# Patient Record
Sex: Male | Born: 1960 | Race: White | Hispanic: No | Marital: Married | Smoking: Former smoker
Health system: Southern US, Community
[De-identification: ages and names within clinical notes are randomized; demographics above are authoritative.]

## PROBLEM LIST (undated history)

## (undated) DIAGNOSIS — E785 Hyperlipidemia, unspecified: Secondary | ICD-10-CM

## (undated) DIAGNOSIS — T4145XA Adverse effect of unspecified anesthetic, initial encounter: Secondary | ICD-10-CM

## (undated) DIAGNOSIS — E119 Type 2 diabetes mellitus without complications: Secondary | ICD-10-CM

## (undated) DIAGNOSIS — Z87442 Personal history of urinary calculi: Secondary | ICD-10-CM

## (undated) DIAGNOSIS — I10 Essential (primary) hypertension: Secondary | ICD-10-CM

## (undated) DIAGNOSIS — Z9989 Dependence on other enabling machines and devices: Secondary | ICD-10-CM

## (undated) DIAGNOSIS — T8859XA Other complications of anesthesia, initial encounter: Secondary | ICD-10-CM

## (undated) DIAGNOSIS — G4733 Obstructive sleep apnea (adult) (pediatric): Secondary | ICD-10-CM

## (undated) DIAGNOSIS — I4819 Other persistent atrial fibrillation: Secondary | ICD-10-CM

## (undated) DIAGNOSIS — I2510 Atherosclerotic heart disease of native coronary artery without angina pectoris: Secondary | ICD-10-CM

## (undated) HISTORY — DX: Obstructive sleep apnea (adult) (pediatric): G47.33

## (undated) HISTORY — PX: KIDNEY STONE SURGERY: SHX686

## (undated) HISTORY — DX: Hyperlipidemia, unspecified: E78.5

## (undated) HISTORY — DX: Other persistent atrial fibrillation: I48.19

## (undated) HISTORY — PX: KNEE SURGERY: SHX244

## (undated) HISTORY — DX: Essential (primary) hypertension: I10

## (undated) HISTORY — DX: Obstructive sleep apnea (adult) (pediatric): Z99.89

---

## 1999-10-17 ENCOUNTER — Encounter: Payer: Self-pay | Admitting: Orthopedic Surgery

## 1999-10-17 ENCOUNTER — Ambulatory Visit (HOSPITAL_COMMUNITY): Admission: RE | Admit: 1999-10-17 | Discharge: 1999-10-17 | Payer: Self-pay | Admitting: Orthopedic Surgery

## 2001-09-18 ENCOUNTER — Emergency Department (HOSPITAL_COMMUNITY): Admission: EM | Admit: 2001-09-18 | Discharge: 2001-09-18 | Payer: Self-pay | Admitting: Emergency Medicine

## 2001-09-18 ENCOUNTER — Encounter: Payer: Self-pay | Admitting: Emergency Medicine

## 2013-08-04 ENCOUNTER — Ambulatory Visit (INDEPENDENT_AMBULATORY_CARE_PROVIDER_SITE_OTHER): Payer: BC Managed Care – PPO

## 2013-08-04 ENCOUNTER — Ambulatory Visit (INDEPENDENT_AMBULATORY_CARE_PROVIDER_SITE_OTHER): Payer: BC Managed Care – PPO | Admitting: General Practice

## 2013-08-04 ENCOUNTER — Encounter (INDEPENDENT_AMBULATORY_CARE_PROVIDER_SITE_OTHER): Payer: Self-pay

## 2013-08-04 ENCOUNTER — Telehealth: Payer: Self-pay | Admitting: Family Medicine

## 2013-08-04 ENCOUNTER — Encounter: Payer: Self-pay | Admitting: General Practice

## 2013-08-04 VITALS — BP 135/78 | HR 76 | Temp 98.0°F | Ht 73.0 in | Wt 304.0 lb

## 2013-08-04 DIAGNOSIS — S99919A Unspecified injury of unspecified ankle, initial encounter: Secondary | ICD-10-CM

## 2013-08-04 DIAGNOSIS — M25569 Pain in unspecified knee: Secondary | ICD-10-CM

## 2013-08-04 DIAGNOSIS — S8990XA Unspecified injury of unspecified lower leg, initial encounter: Secondary | ICD-10-CM

## 2013-08-04 DIAGNOSIS — M25561 Pain in right knee: Secondary | ICD-10-CM

## 2013-08-04 MED ORDER — HYDROCODONE-ACETAMINOPHEN 5-300 MG PO TABS: 1.0000 | ORAL_TABLET | Freq: Three times a day (TID) | ORAL | Status: DC | PRN

## 2013-08-04 NOTE — Telephone Encounter (Signed)
Appt given for today 

## 2013-08-05 NOTE — Progress Notes (Signed)
  Subjective:    Patient ID: Christopher Phillips, male    DOB: Mar 03, 1961, 52 y.o.   MRN: [id number]  Knee Pain  The incident occurred 3 to 5 days ago. The incident occurred at home. The injury mechanism is unknown. The pain is present in the right knee. The quality of the pain is described as aching. The pain is at a severity of 7/10. The pain is moderate. The pain has been constant since onset. Pertinent negatives include no loss of motion, loss of sensation, muscle weakness, numbness or tingling. Associated symptoms comments: Reports difficult to bear weight . He reports no foreign bodies present. The symptoms are aggravated by movement and weight bearing. He has tried ice for the symptoms. The treatment provided no relief.      Review of Systems  Constitutional: Negative for fever and chills.  Respiratory: Negative for shortness of breath.   Cardiovascular: Negative for chest pain.  Musculoskeletal: Negative for joint swelling.       Right knee pain  Neurological: Negative for dizziness, tingling, weakness, numbness and headaches.       Objective:   Physical Exam  Constitutional: He is oriented to person, place, and time. He appears well-developed and well-nourished.  Cardiovascular:  Pulses:      Dorsalis pedis pulses are 1+ on the right side.  Pulmonary/Chest: Effort normal and breath sounds normal. No respiratory distress. He exhibits no tenderness.  Musculoskeletal: He exhibits tenderness.  Right knee pain with movement or palpation   Neurological: He is alert and oriented to person, place, and time.  Skin: Skin is warm and dry.  Psychiatric: He has a normal mood and affect.         [facility] reading (PRIMARY) by Coralie Keens, FNP-C, narrowing joint space right knee, noted.                                   Assessment & Plan:  1. Knee pain, right  - DG Knee 1-2 Views Right; Future - Ambulatory referral to Orthopedic Surgery - Hydrocodone-Acetaminophen 5-300 MG TABS; Take  1 tablet by mouth every 8 (eight) hours as needed.  Dispense: 20 each; Refill: 0  2. Right knee injury, initial encounter -RICE instructions provided and discussed -may seek emergency medical treatment if symptoms worsen prior to ortho appointment -Patient verbalized understanding Coralie Keens, FNP-C

## 2013-08-10 ENCOUNTER — Other Ambulatory Visit: Payer: Self-pay | Admitting: General Practice

## 2013-10-09 ENCOUNTER — Encounter: Payer: Self-pay | Admitting: Physician Assistant

## 2013-10-09 ENCOUNTER — Encounter (INDEPENDENT_AMBULATORY_CARE_PROVIDER_SITE_OTHER): Payer: Self-pay

## 2013-10-09 ENCOUNTER — Ambulatory Visit (INDEPENDENT_AMBULATORY_CARE_PROVIDER_SITE_OTHER): Payer: BC Managed Care – PPO | Admitting: Physician Assistant

## 2013-10-09 VITALS — BP 152/97 | Temp 98.1°F | Resp 74 | Ht 73.0 in | Wt 310.8 lb

## 2013-10-09 DIAGNOSIS — L918 Other hypertrophic disorders of the skin: Secondary | ICD-10-CM

## 2013-10-09 DIAGNOSIS — L909 Atrophic disorder of skin, unspecified: Secondary | ICD-10-CM

## 2013-10-09 NOTE — Progress Notes (Signed)
   Subjective:    Patient ID: Jamarius Saha, male    DOB: 1961/03/22, 52 y.o.   MRN: [id number]  HPI 52 y/o male presents for skin tags that are irritated by clothing.    Review of Systems  Constitutional: Negative.   HENT: Negative.   Respiratory: Negative.   Skin: Negative.   Neurological: Negative.   Psychiatric/Behavioral: Negative.        Objective:   Physical Exam  Nursing note and vitals reviewed. Constitutional: He appears well-developed and well-nourished. No distress.  Pulmonary/Chest: Effort normal and breath sounds normal.  Skin: He is not diaphoretic.  Skin tags noted in bilateral axilla, neckline and R upper eyelid          Assessment & Plan:  1. Benign skin tags: Approximately 12-15 benign appearing skin tags Removed via cryosurgery and excision w/ injection of 2% lidocaine on bilateral axilla, r upper eyelid and neckline. Monsels and compression w/ gauze applied. Instructed patient to use vaseline for healing. No further treatment required.

## 2013-10-09 NOTE — Patient Instructions (Signed)
Apply vaseline for healing. Compression w/ gauze may be needed if bleeding occurs.

## 2013-11-26 ENCOUNTER — Ambulatory Visit: Payer: BC Managed Care – PPO | Attending: Orthopedic Surgery | Admitting: Physical Therapy

## 2013-11-26 DIAGNOSIS — IMO0001 Reserved for inherently not codable concepts without codable children: Secondary | ICD-10-CM | POA: Insufficient documentation

## 2013-11-26 DIAGNOSIS — M1710 Unilateral primary osteoarthritis, unspecified knee: Secondary | ICD-10-CM | POA: Insufficient documentation

## 2013-11-26 DIAGNOSIS — M25669 Stiffness of unspecified knee, not elsewhere classified: Secondary | ICD-10-CM | POA: Insufficient documentation

## 2013-11-26 DIAGNOSIS — R5381 Other malaise: Secondary | ICD-10-CM | POA: Insufficient documentation

## 2013-11-26 DIAGNOSIS — M25569 Pain in unspecified knee: Secondary | ICD-10-CM | POA: Insufficient documentation

## 2013-12-01 ENCOUNTER — Encounter: Payer: BC Managed Care – PPO | Admitting: Physical Therapy

## 2013-12-04 ENCOUNTER — Ambulatory Visit: Payer: BC Managed Care – PPO | Admitting: Physical Therapy

## 2013-12-07 ENCOUNTER — Ambulatory Visit: Payer: BC Managed Care – PPO | Admitting: Physical Therapy

## 2013-12-11 ENCOUNTER — Ambulatory Visit: Payer: BC Managed Care – PPO | Admitting: Physical Therapy

## 2013-12-14 ENCOUNTER — Ambulatory Visit: Payer: BC Managed Care – PPO | Admitting: Family Medicine

## 2013-12-22 ENCOUNTER — Ambulatory Visit: Payer: BC Managed Care – PPO | Attending: Orthopedic Surgery | Admitting: Physical Therapy

## 2013-12-22 DIAGNOSIS — R5381 Other malaise: Secondary | ICD-10-CM | POA: Insufficient documentation

## 2013-12-22 DIAGNOSIS — M25669 Stiffness of unspecified knee, not elsewhere classified: Secondary | ICD-10-CM | POA: Insufficient documentation

## 2013-12-22 DIAGNOSIS — M25569 Pain in unspecified knee: Secondary | ICD-10-CM | POA: Insufficient documentation

## 2013-12-22 DIAGNOSIS — M1710 Unilateral primary osteoarthritis, unspecified knee: Secondary | ICD-10-CM | POA: Insufficient documentation

## 2014-01-06 ENCOUNTER — Other Ambulatory Visit (HOSPITAL_COMMUNITY): Payer: Self-pay | Admitting: Urology

## 2014-01-06 ENCOUNTER — Other Ambulatory Visit: Payer: Self-pay | Admitting: Urology

## 2014-01-06 DIAGNOSIS — N200 Calculus of kidney: Secondary | ICD-10-CM

## 2014-01-22 ENCOUNTER — Encounter (HOSPITAL_COMMUNITY): Payer: Self-pay | Admitting: Pharmacy Technician

## 2014-01-28 NOTE — Patient Instructions (Addendum)
Christopher ChardBarry Phillips  01/28/2014                           YOUR PROCEDURE IS SCHEDULED ON: 02/08/14               PLEASE REPORT RADIOLOGY AT : 7:30 AM               CALL THIS NUMBER IF ANY PROBLEMS THE DAY OF SURGERY :               [phone]                                REMEMBER:   Do not eat food or drink liquids AFTER MIDNIGHT                  Take these medicines the morning of surgery with A SIP OF WATER: none   Do not wear jewelry, make-up   Do not wear lotions, powders, or perfumes.   Do not shave legs or underarms 12 hrs. before surgery (men may shave face)  Do not bring valuables to the hospital.  Contacts, dentures or bridgework may not be worn into surgery.  Leave suitcase in the car. After surgery it may be brought to your room.  For patients admitted to the hospital more than one night, checkout time is            11:00 AM                                                       The day of discharge.   Patients discharged the day of surgery will not be allowed to drive home.            If going home same day of surgery, must have someone stay with you              FIRST 24 hrs at home and arrange for some one to drive you              home from hospital.    Special Instructions             Please read over the following fact sheets that you were given:               1. [place] PREPARING FOR SURGERY SHEET                2. DRINK ONE 8-10 OZ BOTTLE OF MAG CITRATE BY NOON THE DAY BEFORE PROCEDURE                                                    X_____________________________________________________________________        Failure to follow these instructions may result in cancellation of your surgery

## 2014-01-29 ENCOUNTER — Encounter (HOSPITAL_COMMUNITY): Payer: Self-pay

## 2014-01-29 ENCOUNTER — Encounter (HOSPITAL_COMMUNITY)
Admission: RE | Admit: 2014-01-29 | Discharge: 2014-01-29 | Disposition: A | Discharge status: Home / Self Care | Payer: BC Managed Care – PPO | Source: Ambulatory Visit | Attending: Urology | Admitting: Urology

## 2014-01-29 ENCOUNTER — Encounter (INDEPENDENT_AMBULATORY_CARE_PROVIDER_SITE_OTHER): Payer: Self-pay

## 2014-01-29 DIAGNOSIS — Z01810 Encounter for preprocedural cardiovascular examination: Secondary | ICD-10-CM | POA: Insufficient documentation

## 2014-01-29 DIAGNOSIS — Z01812 Encounter for preprocedural laboratory examination: Secondary | ICD-10-CM | POA: Insufficient documentation

## 2014-01-29 HISTORY — DX: Other complications of anesthesia, initial encounter: T88.59XA

## 2014-01-29 HISTORY — DX: Personal history of urinary calculi: Z87.442

## 2014-01-29 HISTORY — DX: Adverse effect of unspecified anesthetic, initial encounter: T41.45XA

## 2014-01-29 LAB — CBC
HEMATOCRIT: 45.1 % (ref 39.0–52.0)
Hemoglobin: 15.7 g/dL (ref 13.0–17.0)
MCH: 31 pg (ref 26.0–34.0)
MCHC: 34.8 g/dL (ref 30.0–36.0)
MCV: 89.1 fL (ref 78.0–100.0)
PLATELETS: 170 10*3/uL (ref 150–400)
RBC: 5.06 MIL/uL (ref 4.22–5.81)
RDW: 13.1 % (ref 11.5–15.5)
WBC: 6 10*3/uL (ref 4.0–10.5)

## 2014-01-29 LAB — BASIC METABOLIC PANEL
BUN: 12 mg/dL (ref 6–23)
CO2: 27 meq/L (ref 19–32)
CREATININE: 0.85 mg/dL (ref 0.50–1.35)
Calcium: 9.6 mg/dL (ref 8.4–10.5)
Chloride: 102 mEq/L (ref 96–112)
GFR calc Af Amer: >90 mL/min (ref >90)
GFR calc non Af Amer: >90 mL/min (ref >90)
Glucose, Bld: 114 mg/dL — ABNORMAL HIGH (ref 70–99)
Potassium: 4.1 mEq/L (ref 3.7–5.3)
Sodium: 140 mEq/L (ref 137–147)

## 2014-01-29 NOTE — Progress Notes (Signed)
01/29/14 1101  OBSTRUCTIVE SLEEP APNEA  Have you ever been diagnosed with sleep apnea through a sleep study? No  Do you snore loudly (loud enough to be heard through closed doors)?  1  Do you often feel tired, fatigued, or sleepy during the daytime? 0  Has anyone observed you stop breathing during your sleep? 1  Do you have, or are you being treated for high blood pressure? 1  BMI more than 35 kg/m2? 1  Age over 55 years old? 1  Neck circumference greater than 40 cm/18 inches? 1  Gender: 1  Obstructive Sleep Apnea Score 7  Score 4 or greater  Results sent to PCP

## 2014-02-04 ENCOUNTER — Other Ambulatory Visit: Payer: Self-pay | Admitting: Radiology

## 2014-02-07 MED ORDER — GENTAMICIN SULFATE 40 MG/ML IJ SOLN
400.0000 mg | INTRAVENOUS | Status: AC
  Administered 2014-02-08: 400 mg via INTRAVENOUS
  Filled 2014-02-07 (×2): qty 10

## 2014-02-08 ENCOUNTER — Encounter (HOSPITAL_COMMUNITY): Payer: BC Managed Care – PPO | Admitting: Anesthesiology

## 2014-02-08 ENCOUNTER — Ambulatory Visit (HOSPITAL_COMMUNITY)
Admission: RE | Admit: 2014-02-08 | Discharge: 2014-02-08 | Disposition: A | Discharge status: Home / Self Care | Payer: BC Managed Care – PPO | Source: Ambulatory Visit | Attending: Urology | Admitting: Urology

## 2014-02-08 ENCOUNTER — Encounter (HOSPITAL_COMMUNITY)
Admission: RE | Disposition: A | Discharge status: Home / Self Care | Payer: Self-pay | Source: Ambulatory Visit | Attending: Urology

## 2014-02-08 ENCOUNTER — Inpatient Hospital Stay (HOSPITAL_COMMUNITY)
Admission: RE | Admit: 2014-02-08 | Discharge: 2014-02-12 | DRG: 661 | Disposition: A | Discharge status: Home / Self Care | Payer: BC Managed Care – PPO | Source: Ambulatory Visit | Attending: Urology | Admitting: Urology

## 2014-02-08 ENCOUNTER — Inpatient Hospital Stay (HOSPITAL_COMMUNITY): Payer: BC Managed Care – PPO | Admitting: Anesthesiology

## 2014-02-08 ENCOUNTER — Inpatient Hospital Stay (HOSPITAL_COMMUNITY): Payer: BC Managed Care – PPO

## 2014-02-08 ENCOUNTER — Encounter (HOSPITAL_COMMUNITY): Payer: Self-pay

## 2014-02-08 VITALS — BP 146/88 | HR 79 | Resp 21

## 2014-02-08 DIAGNOSIS — I4891 Unspecified atrial fibrillation: Secondary | ICD-10-CM

## 2014-02-08 DIAGNOSIS — Z87442 Personal history of urinary calculi: Secondary | ICD-10-CM

## 2014-02-08 DIAGNOSIS — Z87891 Personal history of nicotine dependence: Secondary | ICD-10-CM

## 2014-02-08 DIAGNOSIS — I10 Essential (primary) hypertension: Secondary | ICD-10-CM | POA: Diagnosis present

## 2014-02-08 DIAGNOSIS — N200 Calculus of kidney: Principal | ICD-10-CM | POA: Diagnosis present

## 2014-02-08 DIAGNOSIS — E785 Hyperlipidemia, unspecified: Secondary | ICD-10-CM | POA: Diagnosis present

## 2014-02-08 DIAGNOSIS — Z6837 Body mass index (BMI) 37.0-37.9, adult: Secondary | ICD-10-CM

## 2014-02-08 DIAGNOSIS — N3289 Other specified disorders of bladder: Secondary | ICD-10-CM | POA: Diagnosis present

## 2014-02-08 HISTORY — PX: NEPHROLITHOTOMY: SHX5134

## 2014-02-08 HISTORY — PX: CYSTOSCOPY WITH URETEROSCOPY: SHX5123

## 2014-02-08 LAB — CBC WITH DIFFERENTIAL/PLATELET
BASOS ABS: 0 10*3/uL (ref 0.0–0.1)
Basophils Relative: 0 % (ref 0–1)
EOS ABS: 0.3 10*3/uL (ref 0.0–0.7)
EOS PCT: 5 % (ref 0–5)
HEMATOCRIT: 44.6 % (ref 39.0–52.0)
Hemoglobin: 15.7 g/dL (ref 13.0–17.0)
LYMPHS PCT: 16 % (ref 12–46)
Lymphs Abs: 1.2 10*3/uL (ref 0.7–4.0)
MCH: 31.3 pg (ref 26.0–34.0)
MCHC: 35.2 g/dL (ref 30.0–36.0)
MCV: 88.8 fL (ref 78.0–100.0)
MONO ABS: 0.7 10*3/uL (ref 0.1–1.0)
Monocytes Relative: 9 % (ref 3–12)
Neutro Abs: 5.3 10*3/uL (ref 1.7–7.7)
Neutrophils Relative %: 71 % (ref 43–77)
PLATELETS: 166 10*3/uL (ref 150–400)
RBC: 5.02 MIL/uL (ref 4.22–5.81)
RDW: 12.9 % (ref 11.5–15.5)
WBC: 7.4 10*3/uL (ref 4.0–10.5)

## 2014-02-08 LAB — ABO/RH: ABO/RH(D): O POS

## 2014-02-08 LAB — BASIC METABOLIC PANEL
BUN: 10 mg/dL (ref 6–23)
CALCIUM: 9.4 mg/dL (ref 8.4–10.5)
CHLORIDE: 99 meq/L (ref 96–112)
CO2: 25 meq/L (ref 19–32)
CREATININE: 0.9 mg/dL (ref 0.50–1.35)
GFR calc Af Amer: >90 mL/min (ref >90)
GFR calc non Af Amer: >90 mL/min (ref >90)
GLUCOSE: 111 mg/dL — AB (ref 70–99)
Potassium: 3.9 mEq/L (ref 3.7–5.3)
Sodium: 137 mEq/L (ref 137–147)

## 2014-02-08 LAB — PROTIME-INR
INR: 1 (ref 0.00–1.49)
PROTHROMBIN TIME: 13 s (ref 11.6–15.2)

## 2014-02-08 LAB — TYPE AND SCREEN
ABO/RH(D): O POS
Antibody Screen: NEGATIVE

## 2014-02-08 LAB — HEMOGLOBIN AND HEMATOCRIT, BLOOD
HEMATOCRIT: 41.5 % (ref 39.0–52.0)
HEMOGLOBIN: 14.5 g/dL (ref 13.0–17.0)

## 2014-02-08 LAB — APTT: APTT: 26 s (ref 24–37)

## 2014-02-08 SURGERY — NEPHROLITHOTOMY PERCUTANEOUS
Anesthesia: General | Laterality: Right

## 2014-02-08 MED ORDER — IOHEXOL 300 MG/ML  SOLN
25.0000 mL | Freq: Once | INTRAMUSCULAR | Status: AC | PRN
  Administered 2014-02-08: 1 mL

## 2014-02-08 MED ORDER — ROCURONIUM BROMIDE 100 MG/10ML IV SOLN
INTRAVENOUS | Status: DC | PRN
  Administered 2014-02-08: 20 mg via INTRAVENOUS
  Administered 2014-02-08: 60 mg via INTRAVENOUS
  Administered 2014-02-08: 10 mg via INTRAVENOUS
  Administered 2014-02-08: 20 mg via INTRAVENOUS
  Administered 2014-02-08: 10 mg via INTRAVENOUS
  Administered 2014-02-08: 20 mg via INTRAVENOUS

## 2014-02-08 MED ORDER — FENTANYL CITRATE 0.05 MG/ML IJ SOLN
INTRAMUSCULAR | Status: AC
  Filled 2014-02-08: qty 2

## 2014-02-08 MED ORDER — GLYCOPYRROLATE 0.2 MG/ML IJ SOLN
INTRAMUSCULAR | Status: AC
  Filled 2014-02-08: qty 3

## 2014-02-08 MED ORDER — SODIUM CHLORIDE 0.9 % IR SOLN
Status: DC | PRN
  Administered 2014-02-08: 27000 mL
  Administered 2014-02-08: 16:00:00

## 2014-02-08 MED ORDER — CIPROFLOXACIN IN D5W 400 MG/200ML IV SOLN
400.0000 mg | Freq: Once | INTRAVENOUS | Status: AC
  Administered 2014-02-08: 400 mg via INTRAVENOUS

## 2014-02-08 MED ORDER — NEOSTIGMINE METHYLSULFATE 1 MG/ML IJ SOLN
INTRAMUSCULAR | Status: AC
  Filled 2014-02-08: qty 10

## 2014-02-08 MED ORDER — LIDOCAINE HCL 2 % EX GEL
CUTANEOUS | Status: DC | PRN
  Administered 2014-02-08: 1

## 2014-02-08 MED ORDER — ONDANSETRON HCL 4 MG/2ML IJ SOLN: 4.0000 mg | INTRAMUSCULAR | Status: DC | PRN

## 2014-02-08 MED ORDER — LACTATED RINGERS IV SOLN
INTRAVENOUS | Status: DC
  Administered 2014-02-08: 1000 mL via INTRAVENOUS
  Administered 2014-02-08: 13:00:00 via INTRAVENOUS

## 2014-02-08 MED ORDER — MORPHINE SULFATE 10 MG/ML IJ SOLN
2.0000 mg | INTRAMUSCULAR | Status: DC | PRN
  Administered 2014-02-08: 4 mg via INTRAVENOUS
  Filled 2014-02-08: qty 1

## 2014-02-08 MED ORDER — LISINOPRIL 20 MG PO TABS
20.0000 mg | ORAL_TABLET | Freq: Every day | ORAL | Status: DC
  Administered 2014-02-09 – 2014-02-11 (×3): 20 mg via ORAL
  Filled 2014-02-08 (×4): qty 1

## 2014-02-08 MED ORDER — LIDOCAINE HCL 1 % IJ SOLN
INTRAMUSCULAR | Status: AC
  Filled 2014-02-08: qty 20

## 2014-02-08 MED ORDER — NEOSTIGMINE METHYLSULFATE 1 MG/ML IJ SOLN
INTRAMUSCULAR | Status: DC | PRN
  Administered 2014-02-08: 4 mg via INTRAVENOUS

## 2014-02-08 MED ORDER — FENTANYL CITRATE 0.05 MG/ML IJ SOLN
INTRAMUSCULAR | Status: AC
  Filled 2014-02-08: qty 5

## 2014-02-08 MED ORDER — FENTANYL CITRATE 0.05 MG/ML IJ SOLN
INTRAMUSCULAR | Status: DC | PRN
  Administered 2014-02-08: 100 ug via INTRAVENOUS
  Administered 2014-02-08 (×5): 50 ug via INTRAVENOUS

## 2014-02-08 MED ORDER — MIDAZOLAM HCL 2 MG/2ML IJ SOLN
INTRAMUSCULAR | Status: AC | PRN
  Administered 2014-02-08 (×2): 2 mg via INTRAVENOUS

## 2014-02-08 MED ORDER — HYDROMORPHONE HCL PF 1 MG/ML IJ SOLN
1.0000 mg | INTRAMUSCULAR | Status: DC | PRN
  Administered 2014-02-08 – 2014-02-11 (×11): 1 mg via INTRAVENOUS
  Filled 2014-02-08 (×11): qty 1

## 2014-02-08 MED ORDER — LIDOCAINE HCL (PF) 2 % IJ SOLN
INTRAMUSCULAR | Status: DC | PRN
  Administered 2014-02-08: 100 mg via INTRADERMAL

## 2014-02-08 MED ORDER — PROMETHAZINE HCL 25 MG/ML IJ SOLN: 6.2500 mg | INTRAMUSCULAR | Status: DC | PRN

## 2014-02-08 MED ORDER — PROPOFOL 10 MG/ML IV BOLUS
INTRAVENOUS | Status: DC | PRN
  Administered 2014-02-08: 200 mg via INTRAVENOUS

## 2014-02-08 MED ORDER — LISINOPRIL-HYDROCHLOROTHIAZIDE 20-25 MG PO TABS: 1.0000 | ORAL_TABLET | Freq: Every morning | ORAL | Status: DC

## 2014-02-08 MED ORDER — HYDROCHLOROTHIAZIDE 25 MG PO TABS
25.0000 mg | ORAL_TABLET | Freq: Every day | ORAL | Status: DC
  Administered 2014-02-09 – 2014-02-11 (×3): 25 mg via ORAL
  Filled 2014-02-08 (×4): qty 1

## 2014-02-08 MED ORDER — OXYCODONE HCL 5 MG PO TABS
5.0000 mg | ORAL_TABLET | ORAL | Status: DC | PRN
  Administered 2014-02-08 – 2014-02-11 (×2): 5 mg via ORAL
  Filled 2014-02-08 (×2): qty 1

## 2014-02-08 MED ORDER — FENTANYL CITRATE 0.05 MG/ML IJ SOLN
INTRAMUSCULAR | Status: DC
Start: 2014-02-08 — End: 2014-02-09
  Filled 2014-02-08: qty 4

## 2014-02-08 MED ORDER — FENTANYL CITRATE 0.05 MG/ML IJ SOLN
INTRAMUSCULAR | Status: AC | PRN
  Administered 2014-02-08: 100 ug via INTRAVENOUS

## 2014-02-08 MED ORDER — PROPOFOL 10 MG/ML IV BOLUS
INTRAVENOUS | Status: AC
  Filled 2014-02-08: qty 20

## 2014-02-08 MED ORDER — SENNOSIDES-DOCUSATE SODIUM 8.6-50 MG PO TABS
1.0000 | ORAL_TABLET | Freq: Two times a day (BID) | ORAL | Status: DC
  Administered 2014-02-08 – 2014-02-11 (×5): 1 via ORAL
  Filled 2014-02-08 (×13): qty 1

## 2014-02-08 MED ORDER — SODIUM CHLORIDE 0.9 % IV SOLN
INTRAVENOUS | Status: DC
  Administered 2014-02-08 – 2014-02-10 (×5): via INTRAVENOUS

## 2014-02-08 MED ORDER — DEXAMETHASONE SODIUM PHOSPHATE 10 MG/ML IJ SOLN
INTRAMUSCULAR | Status: AC
  Filled 2014-02-08: qty 1

## 2014-02-08 MED ORDER — CIPROFLOXACIN IN D5W 400 MG/200ML IV SOLN
INTRAVENOUS | Status: AC
  Filled 2014-02-08: qty 200

## 2014-02-08 MED ORDER — MIDAZOLAM HCL 2 MG/2ML IJ SOLN
INTRAMUSCULAR | Status: AC
  Filled 2014-02-08: qty 4

## 2014-02-08 MED ORDER — LIDOCAINE HCL 2 % EX GEL
CUTANEOUS | Status: AC
  Filled 2014-02-08: qty 10

## 2014-02-08 MED ORDER — ONDANSETRON HCL 4 MG/2ML IJ SOLN
INTRAMUSCULAR | Status: DC | PRN
  Administered 2014-02-08: 4 mg via INTRAVENOUS

## 2014-02-08 MED ORDER — SODIUM CHLORIDE 0.9 % IV SOLN
INTRAVENOUS | Status: DC
  Administered 2014-02-08: 08:00:00 via INTRAVENOUS

## 2014-02-08 MED ORDER — GLYCOPYRROLATE 0.2 MG/ML IJ SOLN
INTRAMUSCULAR | Status: DC | PRN
  Administered 2014-02-08: 0.6 mg via INTRAVENOUS

## 2014-02-08 MED ORDER — ROCURONIUM BROMIDE 100 MG/10ML IV SOLN
INTRAVENOUS | Status: AC
  Filled 2014-02-08: qty 1

## 2014-02-08 MED ORDER — IOHEXOL 300 MG/ML  SOLN
INTRAMUSCULAR | Status: DC | PRN
  Administered 2014-02-08: 25 mL

## 2014-02-08 MED ORDER — ONDANSETRON HCL 4 MG/2ML IJ SOLN
INTRAMUSCULAR | Status: AC
  Filled 2014-02-08: qty 2

## 2014-02-08 MED ORDER — DEXAMETHASONE SODIUM PHOSPHATE 10 MG/ML IJ SOLN
INTRAMUSCULAR | Status: DC | PRN
  Administered 2014-02-08: 10 mg via INTRAVENOUS

## 2014-02-08 MED ORDER — FENTANYL CITRATE 0.05 MG/ML IJ SOLN
25.0000 ug | INTRAMUSCULAR | Status: DC | PRN
  Administered 2014-02-08 (×5): 50 ug via INTRAVENOUS

## 2014-02-08 SURGICAL SUPPLY — 61 items
APL SKNCLS STERI-STRIP NONHPOA (GAUZE/BANDAGES/DRESSINGS) ×2
BAG URINE DRAINAGE (UROLOGICAL SUPPLIES) ×5 IMPLANT
BAG URO CATCHER STRL LF (DRAPE) ×2 IMPLANT
BANDAGE GAUZE ELAST BULKY 4 IN (GAUZE/BANDAGES/DRESSINGS) ×2 IMPLANT
BASKET STONE NITINOL 3FRX115MB (UROLOGICAL SUPPLIES) IMPLANT
BENZOIN TINCTURE PRP APPL 2/3 (GAUZE/BANDAGES/DRESSINGS) ×4 IMPLANT
BNDG GAUZE ELAST 4 BULKY (GAUZE/BANDAGES/DRESSINGS) ×2 IMPLANT
CARTRIDGE STONEBREAK CO2 KIDNE (ELECTROSURGICAL) ×1 IMPLANT
CATCHER STONE W/TUBE ADAPTER (UROLOGICAL SUPPLIES) ×1 IMPLANT
CATH COUNCIL 22FR (CATHETERS) IMPLANT
CATH FOLEY 2WAY SLVR  5CC 18FR (CATHETERS) ×1
CATH FOLEY 2WAY SLVR  5CC 26FR (CATHETERS) ×1
CATH FOLEY 2WAY SLVR 5CC 18FR (CATHETERS) ×1 IMPLANT
CATH FOLEY 2WAY SLVR 5CC 26FR (CATHETERS) IMPLANT
CATH INTERMIT  6FR 70CM (CATHETERS) ×2 IMPLANT
CATH URET 5FR 28IN OPEN ENDED (CATHETERS) ×1 IMPLANT
CATH URET DUAL LUMEN 6-10FR 50 (CATHETERS) ×3 IMPLANT
CATH X-FORCE N30 NEPHROSTOMY (TUBING) ×2 IMPLANT
CLOTH BEACON ORANGE TIMEOUT ST (SAFETY) ×2 IMPLANT
COVER SURGICAL LIGHT HANDLE (MISCELLANEOUS) ×2 IMPLANT
DRAPE C-ARM 42X120 X-RAY (DRAPES) ×2 IMPLANT
DRAPE CAMERA CLOSED 9X96 (DRAPES) ×3 IMPLANT
DRAPE LINGEMAN PERC (DRAPES) ×2 IMPLANT
DRAPE SURG IRRIG POUCH 19X23 (DRAPES) ×2 IMPLANT
DRSG TEGADERM 8X12 (GAUZE/BANDAGES/DRESSINGS) ×1 IMPLANT
FIBER LASER FLEXIVA 365 (UROLOGICAL SUPPLIES) IMPLANT
FIBER LASER FLEXIVA 550 (UROLOGICAL SUPPLIES) IMPLANT
GLOVE BIOGEL M 7.0 STRL (GLOVE) ×2 IMPLANT
GLOVE BIOGEL PI IND STRL 7.5 (GLOVE) ×1 IMPLANT
GLOVE BIOGEL PI INDICATOR 7.5 (GLOVE) ×1
GLOVE ECLIPSE 7.0 STRL STRAW (GLOVE) ×2 IMPLANT
GOWN SPEC L4 XLG W/TWL (GOWN DISPOSABLE) ×4 IMPLANT
GOWN STRL REUS W/TWL LRG LVL3 (GOWN DISPOSABLE) ×2 IMPLANT
GUIDEWIRE AMPLAZ .035X145 (WIRE) ×2 IMPLANT
GUIDEWIRE ANG ZIPWIRE 038X150 (WIRE) ×2 IMPLANT
GUIDEWIRE STR DUAL SENSOR (WIRE) ×2 IMPLANT
KIT BASIN OR (CUSTOM PROCEDURE TRAY) ×2 IMPLANT
MANIFOLD NEPTUNE II (INSTRUMENTS) ×2 IMPLANT
PACK BASIC VI WITH GOWN DISP (CUSTOM PROCEDURE TRAY) ×2 IMPLANT
PACK CYSTO (CUSTOM PROCEDURE TRAY) ×2 IMPLANT
PAD ABD 8X10 STRL (GAUZE/BANDAGES/DRESSINGS) ×1 IMPLANT
PROBE KIDNEY STONEBRKR 2.0X425 (ELECTROSURGICAL) ×1 IMPLANT
PROBE LITHOCLAST ULTRA 3.8X403 (UROLOGICAL SUPPLIES) ×1 IMPLANT
PROBE PNEUMATIC 1.0MMX570MM (UROLOGICAL SUPPLIES) ×2 IMPLANT
SCRUB PCMX 4 OZ (MISCELLANEOUS) ×2 IMPLANT
SET IRRIG Y TYPE TUR BLADDER L (SET/KITS/TRAYS/PACK) ×2 IMPLANT
SET WARMING FLUID IRRIGATION (MISCELLANEOUS) ×1 IMPLANT
SHEATH PEELAWAY SET 9 (SHEATH) ×1 IMPLANT
SPONGE GAUZE 4X4 12PLY (GAUZE/BANDAGES/DRESSINGS) ×2 IMPLANT
SPONGE LAP 4X18 X RAY DECT (DISPOSABLE) ×2 IMPLANT
STENT ENDOURETEROTOMY 7-14 26C (STENTS) IMPLANT
STONE CATCHER W/TUBE ADAPTER (UROLOGICAL SUPPLIES) ×2 IMPLANT
SUT SILK 2 0 30  PSL (SUTURE) ×1
SUT SILK 2 0 30 PSL (SUTURE) ×1 IMPLANT
SYR 20CC LL (SYRINGE) ×4 IMPLANT
SYRINGE 10CC LL (SYRINGE) ×2 IMPLANT
SYRINGE IRR TOOMEY STRL 70CC (SYRINGE) ×2 IMPLANT
TOWEL OR NON WOVEN STRL DISP B (DISPOSABLE) ×2 IMPLANT
TRAY FOLEY CATH 14FRSI W/METER (CATHETERS) ×1 IMPLANT
TUBING CONNECTING 10 (TUBING) ×6 IMPLANT
WIRE COONS/BENSON .038X145CM (WIRE) ×2 IMPLANT

## 2014-02-08 NOTE — Procedures (Signed)
Successful RT PCN ACCESS PRIOR TO PNL NO COMP STABLE FULL REPORT IN PACS TO OR NEXT

## 2014-02-08 NOTE — Anesthesia Preprocedure Evaluation (Signed)
Anesthesia Evaluation  Patient identified by MRN, date of birth, ID band Patient awake    Reviewed: Allergy & Precautions, H&P , NPO status , Patient's Chart, lab work & pertinent test results  History of Anesthesia Complications (+) history of anesthetic complications  Airway Mallampati: II TM Distance: >3 FB Neck ROM: Full    Dental no notable dental hx.    Pulmonary neg pulmonary ROS, former smoker,  breath sounds clear to auscultation  Pulmonary exam normal       Cardiovascular Exercise Tolerance: Good hypertension, Pt. on medications Rhythm:Regular Rate:Normal     Neuro/Psych negative neurological ROS  negative psych ROS   GI/Hepatic negative GI ROS, Neg liver ROS,   Endo/Other  negative endocrine ROS  Renal/GU negative Renal ROS  negative genitourinary   Musculoskeletal negative musculoskeletal ROS (+)   Abdominal (+) + obese,   Peds negative pediatric ROS (+)  Hematology negative hematology ROS (+)   Anesthesia Other Findings   Reproductive/Obstetrics negative OB ROS                           Anesthesia Physical Anesthesia Plan  ASA: II  Anesthesia Plan: General   Post-op Pain Management:    Induction: Intravenous  Airway Management Planned: Oral ETT  Additional Equipment:   Intra-op Plan:   Post-operative Plan: Extubation in OR  Informed Consent: I have reviewed the patients History and Physical, chart, labs and discussed the procedure including the risks, benefits and alternatives for the proposed anesthesia with the patient or authorized representative who has indicated his/her understanding and acceptance.   Dental advisory given  Plan Discussed with: CRNA  Anesthesia Plan Comments:         Anesthesia Quick Evaluation

## 2014-02-08 NOTE — Anesthesia Postprocedure Evaluation (Signed)
  Anesthesia Post-op Note  Patient: Christopher ChardBarry Phillips  Procedure(s) Performed: Procedure(s) (LRB): NEPHROLITHOTOMY PERCUTANEOUS (Right) Cystoscopy Left retrograde pyleogram, right antegrade pyleogram (Right)  Patient Location: PACU  Anesthesia Type: General  Level of Consciousness: awake and alert   Airway and Oxygen Therapy: Patient Spontanous Breathing  Post-op Pain: mild  Post-op Assessment: Post-op Vital signs reviewed, Patient's Cardiovascular Status Stable, Respiratory Function Stable, Patent Airway and No signs of Nausea or vomiting  Last Vitals:  Filed Vitals:   02/08/14 1631  BP: 170/92  Pulse:   Temp: 37.1 C  Resp: 21    Post-op Vital Signs: stable   Complications: No apparent anesthesia complications

## 2014-02-08 NOTE — Op Note (Signed)
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 Gross hematuria. Postoperative Diagnosis:  Same  Procedure(s): Right percutaneous nephrostolithotomy (stone >2 cm). Right antegrade pyelogram with interpretation. Cystoscopy. Left retrograde pyelogram with interpretation. Fluoroscopy <1 hour.  Estimated blood loss: 100cc  Specimen: Stones sent for chemical analysis to [facility] lab.  Drains: Right nephroureter catheter. Right percutaneous nephrostomy tube (26Fr). Foley (18Fr).  Complications: None  Findings: Large stone burden on the right side. Negative filling defects on left retrograde pyelogram. Negative left-sided hydronephrosis. Negative bladder tumors on cystoscopy. Filling defects consistent with right-sided kidney stones on the right antegrade pyelogram.  History of present illness: 53 year old male presents with gross hematuria and a right staghorn kidney stone. The stone was greater than 2 cm in size. He presented to eventual radiology earlier today for right percutaneous access. He then proceeded to the operating room for cystoscopy, left ureteroscopy, and right pertinent nephrostolithotomy.    Procedure in detail: After informed consent was obtained, the patient was taken to the operating room. They were placed in the supine position. SCDs were turned on and in place. IV antibiotics were infused, and general anesthesia was induced. A timeout was performed in which the correct patient, surgical site, and procedure were identified and agreed upon by the team.  The patient was placed in a dorsolithotomy position, making sure to pad all pertinent neurovascular pressure points. The genitals were prepped and draped in the usual sterile fashion.  A rigid cystoscope was advanced through the urethra and into the bladder. The bladder was drained and then fully distended.  It was visualized in a systematic fashion to visualize the entire surface of the bladder. A 30 and 70 lens were used. This was negative for bladder tumors. A right nephroureteral catheter was seen emanating from the right ureter orifice.   A left retrograde pyelogram was obtained when I cannulated the left ureter orifice with a 6 French ureter catheter and injected 7 cc of Omnipaque. This was negative for filling defects or hydronephrosis. This side drained well.  I placed an 7418 French Foley catheter with 10 cc of sterile water in the balloon after placing 10 cc of lidocaine jelly into the urethra.  Draping was then removed and the patient was placed in a prone position using large gel rolls perpendicular to the patient's body supporting his clavicles in his anterior superior iliac spines. The genitals and breast were free of pressure. The knees were position with a slight bend in gel padding was placed under the knees. The arms were positioned to prevent any neurovascular pressure. He was then secured to the table.  The right flank where the nephroureteral catheter was emanating was prepped and draped in the usual sterile fashion. A second timeout was performed in which the correct patient, surgical site and procedure were divided and agreed upon by the team.  I then placed a superstiff wire down the nephroureteral catheter under fluoroscopy into the bladder. The nephroureteral catheter was then removed. I then incised the skin with a 15 blade to make a 2 cm opening. I then placed the finger along the wire down to the fascia. I then placed a dual-lumen ureter access sheath over the wire under fluoroscopy into the right renal pelvis. I injected contrast to obtain a right antegrade pyelogram. I injected 10 cc of Omnipaque. This was negative for transition was positive for filling defects consistent with stones. It  showed that I had access and what appeared to be one of the lower calyces. I then injected  another 10 cc of Omnipaque while withdrawing the nephroureteral catheter to ensure that there was extravasation into the soft tissue and none into bowel or colon. This was negative for filling into a piece of bowel.  I then readvanced the dual-lumen access sheath in place a second superstiff wire down the right ureter into the bladder under fluoroscopy. One wire was secured the safety wire and the other was used as a working wire.  I then dilated the nephrostomy tract by placing a nephrostomy balloon under fluoroscopy with the tip in the o aches. I then inflated this under fluoroscopy with Omnipaque and the balloon. This is inflated to 12 atmospheres and held for over 3 minutes. I then placed the access sheath over the inflated balloon under fluoroscopy. The tip was just inside the o aches. The balloon was then deflated without large amount of return of blood. The working wire was then secured to the drape.  I advanced a flexible nephroscope through the access sheath and, self in the collecting system with stones immediately visible. I exchanged the flexible nephroscope for rigid nephroscope. Stones were grasped with a three-pronged grasper and removed. I then encountered a larger stone in this was broken up with the pneumatic stone breaker and the pieces were removed. As I moved towards the ureteropelvic junction I encountered stones that were too large to use pneumatic number. I then placed the lithoclast using only ultrasonic energy first. The stone was noted to be very hard and I added the pneumatic device to this. Lithotripsy was carried out very cautiously to prevent any injury to the internal portions of the kidney. As large fragments were breakoff I would remove them with 3 prong grasper. A clear large portion of stones, but there remained a very large dense stone which was difficult to break the part. Over time visualization became worse and do to mucosal bleeding. There was no significant serious  bleeding noted. Because of this I elected to stop the procedure and returned in a stage fashion as planned. I withdrew the nephroscope.  I then placed a 5 [place] ureter catheter over the safety wire down into the bladder under fluoroscopy. This was sutured to the skin with silk suture. I then placed a 726 French council-tip catheter over the working wire through the sheath. This was inflated with half-and-half normal saline/Omnipaque with a total of 3 cc of contrast. This was seated into the renal pelvis. I then injected half-and-half Omnipaque/saline through the nephrostomy tube and there was noted to be no extravasation with good flow of contrast down the ureter. I then sutured the nephrostomy tube to the skin with a silk suture. The sheath was removed without a large return of blood. This was removed off of the nephrostomy tube. All the wires were removed.  The back was dressed with Kerlix gauze, ABDs, and tape. The nephrostomy tubes placed to drainage.  He was placed back in a supine position, anesthesia was reversed, and he was taken to the PACU in a stable condition.  All counts were correct at the end of the case.

## 2014-02-08 NOTE — Progress Notes (Signed)
GU post-op check Has right flank pain as expected. Morphine not helping as much. I explained the course of the surgery and surgical findings.   Hgb 14.5 (15.7 pre-op)  Filed Vitals:   02/08/14 1631  BP: 170/92  Pulse:   Temp: 98.8 F (37.1 C)  Resp: 21   Gen: NAD, AAO Abd: soft, ND, NTTP GU: Right nephrostomy tube and foley draining red urine. CV: RRR, S1/S2. Chest: CTA-B  A/P: Right staghorn kidney stone. -Morning labs. -Change morphine to dilaudid. -Plan for second look on Wednesday.

## 2014-02-08 NOTE — H&P (Signed)
Chief Complaint: "I'm here for my kidney stone treatment" Referring Physician:Woodruff HPI: Christopher Phillips is an 53 y.o. male with a large right staghorn renal calculus. He is scheduled for (R)PCNL and has arrived with IR for placement of (R) perc nephroureteral access catheter. PMHx and meds reviewed. No recent illness or fevers.  Past Medical History:  Past Medical History  Diagnosis Date  . Hypertension   . Hyperlipidemia   . Complication of anesthesia     slow to wake up  . History of kidney stones     Past Surgical History:  Past Surgical History  Procedure Laterality Date  . Knee surgery      BILATERAL  . Kidney stone surgery      20 YRS AGO    Family History: No family history on file.  Social History:  reports that he quit smoking about 21 years ago. He does not have any smokeless tobacco history on file. He reports that he does not drink alcohol or use illicit drugs.  Allergies: No Known Allergies  Medications:   Medication List    Notice   This visit is during an admission. Changes to the med list made in this visit will be reflected in the After Visit Summary of the admission.      Please HPI for pertinent positives, otherwise complete 10 system ROS negative.  Physical Exam: Temp: 98.8, BP: 137/85, HR: 75, RR: 18   General Appearance:  Alert, cooperative, no distress, appears stated age  Head:  Normocephalic, without obvious abnormality, atraumatic  ENT: Unremarkable  Neck: Supple, symmetrical, trachea midline  Lungs:   Clear to auscultation bilaterally, no w/r/r, respirations unlabored without use of accessory muscles.  Chest Wall:  No tenderness or deformity  Heart:  Regular rate and rhythm, S1, S2 normal, no murmur, rub or gallop.  Abdomen:   Soft, non-tender, non distended.  Extremities: Extremities normal, atraumatic, no cyanosis or edema  Pulses: 2+ and symmetric  Neurologic: Normal affect, no gross deficits.   Results for orders placed during  the hospital encounter of 02/08/14 (from the past 48 hour(s))  APTT     Status: None   Collection Time    02/08/14  8:00 AM      Result Value Ref Range   aPTT 26  24 - 37 seconds  BASIC METABOLIC PANEL     Status: Abnormal   Collection Time    02/08/14  8:00 AM      Result Value Ref Range   Sodium 137  137 - 147 mEq/L   Potassium 3.9  3.7 - 5.3 mEq/L   Chloride 99  96 - 112 mEq/L   CO2 25  19 - 32 mEq/L   Glucose, Bld 111 (*) 70 - 99 mg/dL   BUN 10  6 - 23 mg/dL   Creatinine, Ser 0.90  0.50 - 1.35 mg/dL   Calcium 9.4  8.4 - 10.5 mg/dL   GFR calc non Af Amer >90  >90 mL/min   GFR calc Af Amer >90  >90 mL/min   Comment: (NOTE)     The eGFR has been calculated using the CKD EPI equation.     This calculation has not been validated in all clinical situations.     eGFR's persistently <90 mL/min signify possible Chronic Kidney     Disease.  CBC WITH DIFFERENTIAL     Status: None   Collection Time    02/08/14  8:00 AM      Result Value Ref  Range   WBC 7.4  4.0 - 10.5 K/uL   RBC 5.02  4.22 - 5.81 MIL/uL   Hemoglobin 15.7  13.0 - 17.0 g/dL   HCT 44.6  39.0 - 52.0 %   MCV 88.8  78.0 - 100.0 fL   MCH 31.3  26.0 - 34.0 pg   MCHC 35.2  30.0 - 36.0 g/dL   RDW 12.9  11.5 - 15.5 %   Platelets 166  150 - 400 K/uL   Neutrophils Relative % 71  43 - 77 %   Neutro Abs 5.3  1.7 - 7.7 K/uL   Lymphocytes Relative 16  12 - 46 %   Lymphs Abs 1.2  0.7 - 4.0 K/uL   Monocytes Relative 9  3 - 12 %   Monocytes Absolute 0.7  0.1 - 1.0 K/uL   Eosinophils Relative 5  0 - 5 %   Eosinophils Absolute 0.3  0.0 - 0.7 K/uL   Basophils Relative 0  0 - 1 %   Basophils Absolute 0.0  0.0 - 0.1 K/uL  PROTIME-INR     Status: None   Collection Time    02/08/14  8:00 AM      Result Value Ref Range   Prothrombin Time 13.0  11.6 - 15.2 seconds   INR 1.00  0.00 - 1.49  TYPE AND SCREEN     Status: None   Collection Time    02/08/14  8:00 AM      Result Value Ref Range   ABO/RH(D) O POS     Antibody Screen  PENDING     Sample Expiration 02/11/2014     No results found.  Assessment/Plan (R) renal calculus. For (R)PCNL Discussed IR placement of PCN with [place] and x-ray guidance. Explained procedure, risks, complications, use of sedation. Labs reviewed. Consent signed in chart   Ascencion Dike PA-C 02/08/2014, 8:46 AM

## 2014-02-08 NOTE — Progress Notes (Signed)
Patient transferred from PACU to 1506, alert/oriented C/O sever right flank surgical pain, PRN morphine given with no relief, Dr Margarita GrizzleWoodruff at the bed side, ordered dilaudid, 1mg  IV dilaudid given with alittle relief but not much, PRN oxy-IR given will continue to monitor. Patient wants to walk later.

## 2014-02-08 NOTE — H&P (Signed)
Urology History and Physical Exam  CC: Right staghorn kidney stone.  HPI:  53 year old male presents today for right staghorn calculus.  He has a known history of a large right-sided kidney stone.  Repeat imaging in February 2015 revealed a large stone which involved the right lower and midpole portions of the kidney.  It measured 3 x 3.5 2.7 cm.  This is not associated with flank pain.  It is associated with microscopic hematuria and gross hematuria.  UA 01/29/14 was negative for signs of infection.  We have discussed management options and he presents today for right-sided percutaneous nephrostolithotomy.  This is the first step in a staged procedure.  We have also discussed performing cystoscopy due to his gross hematuria to ensure there is no other source of the blood.  He had right percutaneous access obtained by interventional radiology today.  I reviewed access films.  This revealed a partially duplicated, non-obstructed right ureter. There was good lower pole access.  PMH: Past Medical History  Diagnosis Date  . Hypertension   . Hyperlipidemia   . Complication of anesthesia     slow to wake up  . History of kidney stones     PSH: Past Surgical History  Procedure Laterality Date  . Knee surgery      BILATERAL  . Kidney stone surgery      20 YRS AGO    Allergies: No Known Allergies  Medications: No prescriptions prior to admission     Social History: History   Social History  . Marital Status: Married    Spouse Name: N/A    Number of Children: N/A  . Years of Education: N/A   Occupational History  . Not on file.   Social History Main Topics  . Smoking status: Former Smoker    Quit date: 01/29/1993  . Smokeless tobacco: Not on file  . Alcohol Use: No  . Drug Use: No  . Sexual Activity: Not on file   Other Topics Concern  . Not on file   Social History Narrative  . No narrative on file    Family History: No family history on file.  Review of  Systems: Positive: None. Negative: Fever, SOB, or chest pain.  A further 10 point review of systems was negative except what is listed in the HPI.  Physical Exam: Filed Vitals:   02/08/14 0741  BP: 137/85  Pulse: 75  Temp: 98.8 F (37.1 C)  Resp: 18    General: No acute distress.  Awake. Head:  Normocephalic.  Atraumatic. ENT:  EOMI.  Mucous membranes moist Neck:  Supple.  No lymphadenopathy. CV:  S1 present. S2 present. Regular rate. Pulmonary: Equal effort bilaterally.  Clear to auscultation bilaterally. Abdomen: Soft.  Non- tender to palpation. Skin:  Normal turgor.  No visible rash. Extremity: No gross deformity of bilateral upper extremities.  No gross deformity of    bilateral lower extremities. Neurologic: Alert. Appropriate mood.    Studies:  No results found for this basename: HGB, WBC, PLT,  in the last 72 hours  No results found for this basename: NA, K, CL, CO2, BUN, CREATININE, CALCIUM, MAGNESIUM, GFRNONAA, GFRAA,  in the last 72 hours   No results found for this basename: PT, INR, APTT,  in the last 72 hours   No components found with this basename: ABG,     Assessment:  Right staghorn kidney stone.  Plan: To OR  For first stage right-sided percutaneous nephrostolithotomy.

## 2014-02-08 NOTE — Transfer of Care (Signed)
Immediate Anesthesia Transfer of Care Note  Patient: Christopher ChardBarry Phillips  Procedure(s) Performed: Procedure(s) with comments: NEPHROLITHOTOMY PERCUTANEOUS (Right) - 1ST STAGE (RT) PCNL     Cystoscopy Left retrograde pyleogram, right antegrade pyleogram (Right)  Patient Location: PACU  Anesthesia Type:General  Level of Consciousness: awake and sedated  Airway & Oxygen Therapy: Patient Spontanous Breathing and Patient connected to face mask oxygen  Post-op Assessment: Report given to PACU RN and Post -op Vital signs reviewed and stable  Post vital signs: Reviewed and stable  Complications: No apparent anesthesia complications

## 2014-02-09 ENCOUNTER — Encounter (HOSPITAL_COMMUNITY): Payer: Self-pay | Admitting: Urology

## 2014-02-09 ENCOUNTER — Inpatient Hospital Stay (HOSPITAL_COMMUNITY): Admission: RE | Admit: 2014-02-09 | Payer: BC Managed Care – PPO | Source: Ambulatory Visit | Admitting: Urology

## 2014-02-09 LAB — BASIC METABOLIC PANEL
BUN: 9 mg/dL (ref 6–23)
CALCIUM: 8.7 mg/dL (ref 8.4–10.5)
CO2: 27 mEq/L (ref 19–32)
Chloride: 104 mEq/L (ref 96–112)
Creatinine, Ser: 0.85 mg/dL (ref 0.50–1.35)
Glucose, Bld: 133 mg/dL — ABNORMAL HIGH (ref 70–99)
POTASSIUM: 4.8 meq/L (ref 3.7–5.3)
SODIUM: 142 meq/L (ref 137–147)

## 2014-02-09 LAB — HEMOGLOBIN AND HEMATOCRIT, BLOOD
HCT: 40.9 % (ref 39.0–52.0)
HEMOGLOBIN: 14.1 g/dL (ref 13.0–17.0)

## 2014-02-09 MED ORDER — BISACODYL 10 MG RE SUPP
10.0000 mg | Freq: Two times a day (BID) | RECTAL | Status: DC
  Administered 2014-02-09 – 2014-02-12 (×4): 10 mg via RECTAL
  Filled 2014-02-09 (×7): qty 1

## 2014-02-09 MED ORDER — SODIUM CHLORIDE 0.9 % IV SOLN
2.0000 g | INTRAVENOUS | Status: AC
  Administered 2014-02-10: 2 g via INTRAVENOUS
  Filled 2014-02-09: qty 2000

## 2014-02-09 MED ORDER — BELLADONNA ALKALOIDS-OPIUM 16.2-60 MG RE SUPP: 1.0000 | Freq: Four times a day (QID) | RECTAL | Status: DC | PRN

## 2014-02-09 NOTE — Progress Notes (Signed)
Urology Progress Note  Subjective:     No acute urologic events overnight. Positive early ambulation. Pain better controlled w/ IV dilaudid. Pain is in right flank; no abdominal pain. Negative BM. Positive flatus.  ROS: Negative: fever or SOB.  Objective:  Patient Vitals for the past 24 hrs:  BP Temp Temp src Pulse Resp SpO2 Height Weight  02/09/14 0529 130/69 mmHg 97.8 F (36.6 C) Oral 62 20 100 % - -  02/09/14 0157 124/64 mmHg 98.1 F (36.7 C) Oral 68 20 100 % - -  02/08/14 2237 140/90 mmHg 98.4 F (36.9 C) Oral 79 20 98 % - -  02/08/14 1828 132/73 mmHg - - 70 - - - -  02/08/14 1631 170/92 mmHg 98.8 F (37.1 C) - - 21 97 % - -  02/08/14 1615 131/78 mmHg 98.2 F (36.8 C) - 76 21 97 % - -  02/08/14 1600 135/73 mmHg - - 71 11 99 % - -  02/08/14 1545 138/75 mmHg - - 66 18 100 % - -  02/08/14 1530 143/78 mmHg - - 74 20 100 % - -  02/08/14 1521 139/73 mmHg 98.1 F (36.7 C) - 72 15 98 % - -  02/08/14 1250 - - - - - - 6\' 1"  (1.854 m) 127.461 kg (281 lb)  02/08/14 0741 137/85 mmHg 98.8 F (37.1 C) Oral 75 18 98 % - -    Physical Exam: General:  No acute distress, awake Cardiovascular:    [x]   S1/S2 present, RRR  []   Irregularly irregular Chest:  CTA-B Abdomen:               []  Soft, appropriately TTP  [x]  Soft, NTTP, ND.  []  Soft, appropriately TTP, incision(s) clean/dry/intact  Genitourinary: Right flank dressed without ecchymosis. Foley and right nephrostomy tube draining red urine.     I/O last 3 completed shifts: In: 3305 [P.O.:480; I.V.:2825] Out: 3815 [Urine:3765; Blood:50]  Recent Labs     02/08/14  0800  02/08/14  1532  02/09/14  0425  HGB  15.7  14.5  14.1  WBC  7.4   --    --   PLT  166   --    --     Recent Labs     02/08/14  0800  02/09/14  0425  NA  137  142  K  3.9  4.8  CL  99  104  CO2  25  27  BUN  10  9  CREATININE  0.90  0.85  CALCIUM  9.4  8.7  GFRNONAA  >90  >90  GFRAA  >90  >90     Recent Labs     02/08/14  0800  INR   1.00  APTT  26     No components found with this basename: ABG,     Length of stay: 1 days.  Assessment: Right staghorn nephrolithiasis. POD#1 Right percutaneous nephrostolithotomy.   Plan: -Dulcolax suppository. -B&O for bladder spasms. -NPO at midnight. -We discussed risk/benefit of chemical anticoagulation; he wishes to defer at this time. I agree with his urine as bloody as it is right now.   Natalia Leatherwoodaniel Rashidah [place], MD [phone]

## 2014-02-09 NOTE — Care Management Note (Signed)
    Page 1 of 1   02/09/2014     12:38:09 PM CARE MANAGEMENT NOTE 02/09/2014  Patient:  Christopher ChardBOWMAN,Damin   Account Number:  [id number]  Date Initiated:  02/09/2014  Documentation initiated by:  Lanier ClamMAHABIR,Rishaan Gunner  Subjective/Objective Assessment:   53 Y/O M ADMITTED W/R STAGHORN KIDNEY STONES.     Action/Plan:   FROM HOME.HAS PCP,PHARMACY.   Anticipated DC Date:  02/10/2014   Anticipated DC Plan:  HOME/SELF CARE      DC Planning Services  CM consult      Choice offered to / List presented to:             Status of service:  In process, will continue to follow Medicare Important Message given?   (If response is "NO", the following Medicare IM given date fields will be blank) Date Medicare IM given:   Date Additional Medicare IM given:    Discharge Disposition:    Per UR Regulation:  Reviewed for med. necessity/level of care/duration of stay  If discussed at Long Length of Stay Meetings, dates discussed:    Comments:  02/09/14 Shunsuke Granzow RN,BSN NCM [phone] POD#1 R PCN-STAGE 1.FOR STAGE 2 IN AM.NO ANTICIPATED D/C NEEDS.

## 2014-02-10 ENCOUNTER — Inpatient Hospital Stay (HOSPITAL_COMMUNITY): Payer: BC Managed Care – PPO | Admitting: Anesthesiology

## 2014-02-10 ENCOUNTER — Inpatient Hospital Stay (HOSPITAL_COMMUNITY): Payer: BC Managed Care – PPO

## 2014-02-10 ENCOUNTER — Other Ambulatory Visit: Payer: Self-pay

## 2014-02-10 ENCOUNTER — Encounter (HOSPITAL_COMMUNITY): Payer: BC Managed Care – PPO | Admitting: Anesthesiology

## 2014-02-10 ENCOUNTER — Encounter (HOSPITAL_COMMUNITY): Payer: Self-pay | Admitting: Cardiology

## 2014-02-10 ENCOUNTER — Encounter (HOSPITAL_COMMUNITY)
Admission: RE | Disposition: A | Discharge status: Home / Self Care | Payer: Self-pay | Source: Ambulatory Visit | Attending: Urology

## 2014-02-10 DIAGNOSIS — I10 Essential (primary) hypertension: Secondary | ICD-10-CM | POA: Diagnosis present

## 2014-02-10 DIAGNOSIS — I4891 Unspecified atrial fibrillation: Secondary | ICD-10-CM

## 2014-02-10 HISTORY — PX: NEPHROLITHOTOMY: SHX5134

## 2014-02-10 LAB — COMPREHENSIVE METABOLIC PANEL
ALT: 17 U/L (ref 0–53)
AST: 13 U/L (ref 0–37)
Albumin: 3.7 g/dL (ref 3.5–5.2)
Alkaline Phosphatase: 58 U/L (ref 39–117)
BUN: 7 mg/dL (ref 6–23)
CO2: 27 mEq/L (ref 19–32)
Calcium: 9.1 mg/dL (ref 8.4–10.5)
Chloride: 101 mEq/L (ref 96–112)
Creatinine, Ser: 0.85 mg/dL (ref 0.50–1.35)
GFR calc non Af Amer: >90 mL/min (ref >90)
GLUCOSE: 130 mg/dL — AB (ref 70–99)
Potassium: 4.2 mEq/L (ref 3.7–5.3)
Sodium: 140 mEq/L (ref 137–147)
TOTAL PROTEIN: 6.7 g/dL (ref 6.0–8.3)
Total Bilirubin: 0.5 mg/dL (ref 0.3–1.2)

## 2014-02-10 LAB — TROPONIN I

## 2014-02-10 LAB — HEMOGLOBIN AND HEMATOCRIT, BLOOD
HCT: 38.4 % — ABNORMAL LOW (ref 39.0–52.0)
HCT: 41.8 % (ref 39.0–52.0)
HEMOGLOBIN: 14.1 g/dL (ref 13.0–17.0)
Hemoglobin: 12.8 g/dL — ABNORMAL LOW (ref 13.0–17.0)

## 2014-02-10 LAB — BASIC METABOLIC PANEL
BUN: 8 mg/dL (ref 6–23)
CALCIUM: 8.7 mg/dL (ref 8.4–10.5)
CHLORIDE: 104 meq/L (ref 96–112)
CO2: 27 mEq/L (ref 19–32)
Creatinine, Ser: 0.97 mg/dL (ref 0.50–1.35)
GFR calc Af Amer: >90 mL/min (ref >90)
GFR calc non Af Amer: >90 mL/min (ref >90)
GLUCOSE: 102 mg/dL — AB (ref 70–99)
Potassium: 4.3 mEq/L (ref 3.7–5.3)
Sodium: 140 mEq/L (ref 137–147)

## 2014-02-10 LAB — ETHANOL: Alcohol, Ethyl (B): <11 mg/dL (ref 0–11)

## 2014-02-10 LAB — MRSA PCR SCREENING: MRSA by PCR: NEGATIVE

## 2014-02-10 SURGERY — NEPHROLITHOTOMY PERCUTANEOUS SECOND LOOK
Anesthesia: General | Site: Flank | Laterality: Right

## 2014-02-10 MED ORDER — ONDANSETRON HCL 4 MG/2ML IJ SOLN
INTRAMUSCULAR | Status: DC | PRN
  Administered 2014-02-10: 4 mg via INTRAVENOUS

## 2014-02-10 MED ORDER — DILTIAZEM HCL 25 MG/5ML IV SOLN
20.0000 mg | Freq: Once | INTRAVENOUS | Status: AC
  Administered 2014-02-10: 20 mg via INTRAVENOUS

## 2014-02-10 MED ORDER — ROCURONIUM BROMIDE 100 MG/10ML IV SOLN
INTRAVENOUS | Status: DC | PRN
  Administered 2014-02-10: 10 mg via INTRAVENOUS
  Administered 2014-02-10 (×2): 5 mg via INTRAVENOUS
  Administered 2014-02-10: 25 mg via INTRAVENOUS
  Administered 2014-02-10: 5 mg via INTRAVENOUS

## 2014-02-10 MED ORDER — NEOSTIGMINE METHYLSULFATE 1 MG/ML IJ SOLN
INTRAMUSCULAR | Status: DC | PRN
  Administered 2014-02-10: 4.5 mg via INTRAVENOUS

## 2014-02-10 MED ORDER — SUCCINYLCHOLINE CHLORIDE 20 MG/ML IJ SOLN
INTRAMUSCULAR | Status: DC | PRN
  Administered 2014-02-10: 160 mg via INTRAVENOUS

## 2014-02-10 MED ORDER — DEXTROSE-NACL 5-0.9 % IV SOLN
INTRAVENOUS | Status: DC
  Administered 2014-02-10 (×2): via INTRAVENOUS

## 2014-02-10 MED ORDER — DILTIAZEM HCL 25 MG/5ML IV SOLN
INTRAVENOUS | Status: AC
  Filled 2014-02-10: qty 5

## 2014-02-10 MED ORDER — SODIUM CHLORIDE 0.9 % IJ SOLN
INTRAMUSCULAR | Status: AC
  Filled 2014-02-10: qty 10

## 2014-02-10 MED ORDER — PROPOFOL 10 MG/ML IV BOLUS
INTRAVENOUS | Status: DC | PRN
  Administered 2014-02-10: 180 mg via INTRAVENOUS

## 2014-02-10 MED ORDER — IOHEXOL 300 MG/ML  SOLN
20.0000 mL | Freq: Once | INTRAMUSCULAR | Status: AC | PRN
  Administered 2014-02-10: 20 mL

## 2014-02-10 MED ORDER — GLYCOPYRROLATE 0.2 MG/ML IJ SOLN
INTRAMUSCULAR | Status: DC | PRN
  Administered 2014-02-10: .8 mg via INTRAVENOUS

## 2014-02-10 MED ORDER — MIDAZOLAM HCL 5 MG/5ML IJ SOLN
INTRAMUSCULAR | Status: DC | PRN
Start: 2014-02-10 — End: 2014-02-10
  Administered 2014-02-10: 2 mg via INTRAVENOUS

## 2014-02-10 MED ORDER — IOHEXOL 300 MG/ML  SOLN
INTRAMUSCULAR | Status: DC | PRN
Start: 2014-02-10 — End: 2014-02-10
  Administered 2014-02-10: 10 mL

## 2014-02-10 MED ORDER — LIDOCAINE HCL (CARDIAC) 20 MG/ML IV SOLN
INTRAVENOUS | Status: DC | PRN
  Administered 2014-02-10: 50 mg via INTRAVENOUS

## 2014-02-10 MED ORDER — SODIUM CHLORIDE 0.9 % IR SOLN
Status: DC | PRN
  Administered 2014-02-10: 24000 mL

## 2014-02-10 MED ORDER — EPHEDRINE SULFATE 50 MG/ML IJ SOLN
INTRAMUSCULAR | Status: DC | PRN
  Administered 2014-02-10: 10 mg via INTRAVENOUS
  Administered 2014-02-10: 5 mg via INTRAVENOUS

## 2014-02-10 MED ORDER — HYDROMORPHONE HCL PF 1 MG/ML IJ SOLN
INTRAMUSCULAR | Status: DC | PRN
  Administered 2014-02-10: 1 mg via INTRAVENOUS

## 2014-02-10 MED ORDER — FENTANYL CITRATE 0.05 MG/ML IJ SOLN
INTRAMUSCULAR | Status: DC | PRN
  Administered 2014-02-10: 50 ug via INTRAVENOUS
  Administered 2014-02-10: 100 ug via INTRAVENOUS
  Administered 2014-02-10: 25 ug via INTRAVENOUS
  Administered 2014-02-10: 50 ug via INTRAVENOUS
  Administered 2014-02-10: 25 ug via INTRAVENOUS

## 2014-02-10 MED ORDER — ESMOLOL HCL 10 MG/ML IV SOLN
INTRAVENOUS | Status: DC | PRN
  Administered 2014-02-10: 50 mg via INTRAVENOUS

## 2014-02-10 MED ORDER — SODIUM CHLORIDE 0.9 % IV SOLN
1.0000 g | Freq: Four times a day (QID) | INTRAVENOUS | Status: AC
  Administered 2014-02-10 – 2014-02-11 (×4): 1 g via INTRAVENOUS
  Filled 2014-02-10 (×5): qty 1000

## 2014-02-10 MED ORDER — PROMETHAZINE HCL 25 MG/ML IJ SOLN
6.2500 mg | INTRAMUSCULAR | Status: DC | PRN
Start: 2014-02-10 — End: 2014-02-10

## 2014-02-10 MED ORDER — HYDROMORPHONE HCL PF 1 MG/ML IJ SOLN: 0.2500 mg | INTRAMUSCULAR | Status: DC | PRN

## 2014-02-10 MED ORDER — BUPIVACAINE-EPINEPHRINE PF 0.25-1:200000 % IJ SOLN
INTRAMUSCULAR | Status: DC | PRN
  Administered 2014-02-10: 30 mL

## 2014-02-10 MED ORDER — DEXTROSE 5 % IV SOLN
5.0000 mg/h | INTRAVENOUS | Status: DC
  Administered 2014-02-10: 10 mg/h via INTRAVENOUS
  Administered 2014-02-10: 20 mg/h via INTRAVENOUS
  Administered 2014-02-10: 10 mg/h via INTRAVENOUS
  Filled 2014-02-10: qty 100

## 2014-02-10 MED ORDER — LACTATED RINGERS IV SOLN
INTRAVENOUS | Status: DC
  Administered 2014-02-10: 15:00:00 via INTRAVENOUS
  Administered 2014-02-10: 1000 mL via INTRAVENOUS

## 2014-02-10 SURGICAL SUPPLY — 68 items
APL SKNCLS STERI-STRIP NONHPOA (GAUZE/BANDAGES/DRESSINGS) ×4
BAG URINE DRAINAGE (UROLOGICAL SUPPLIES) ×1 IMPLANT
BAG URO CATCHER STRL LF (DRAPE) ×1 IMPLANT
BASKET STONE NITINOL 3FRX115MB (UROLOGICAL SUPPLIES) IMPLANT
BASKET ZERO TIP NITINOL 2.4FR (BASKET) ×2 IMPLANT
BENZOIN TINCTURE PRP APPL 2/3 (GAUZE/BANDAGES/DRESSINGS) ×6 IMPLANT
BSKT STON RTRVL ZERO TP 2.4FR (BASKET) ×2
CATCHER STONE W/TUBE ADAPTER (UROLOGICAL SUPPLIES) ×1 IMPLANT
CATH DILATION NEPHR BALL 15X10 (CATHETERS) ×2 IMPLANT
CATH FOLEY 2W COUNCIL 20FR 5CC (CATHETERS) IMPLANT
CATH FOLEY 2WAY SLVR  5CC 18FR (CATHETERS)
CATH FOLEY 2WAY SLVR 5CC 18FR (CATHETERS) ×1 IMPLANT
CATH INTERMIT  6FR 70CM (CATHETERS) ×1 IMPLANT
CATH ROBINSON RED A/P 20FR (CATHETERS) IMPLANT
CATH URET 5FR 28IN OPEN ENDED (CATHETERS) IMPLANT
CATH URET DUAL LUMEN 6-10FR 50 (CATHETERS) ×2 IMPLANT
CATH X-FORCE N30 NEPHROSTOMY (TUBING) ×1 IMPLANT
CLOTH BEACON ORANGE TIMEOUT ST (SAFETY) ×1 IMPLANT
COVER SURGICAL LIGHT HANDLE (MISCELLANEOUS) ×3 IMPLANT
DRAPE C-ARM 42X120 X-RAY (DRAPES) ×3 IMPLANT
DRAPE CAMERA CLOSED 9X96 (DRAPES) ×3 IMPLANT
DRAPE LINGEMAN PERC (DRAPES) ×3 IMPLANT
DRAPE SURG IRRIG POUCH 19X23 (DRAPES) ×3 IMPLANT
DRAPE UTILITY XL STRL (DRAPES) ×3 IMPLANT
DRSG TEGADERM 8X12 (GAUZE/BANDAGES/DRESSINGS) ×4 IMPLANT
EXTRACTOR STONE NITINOL NGAGE (UROLOGICAL SUPPLIES) ×2 IMPLANT
FIBER LASER FLEXIVA 550 (UROLOGICAL SUPPLIES) IMPLANT
GLOVE BIOGEL M 7.0 STRL (GLOVE) ×1 IMPLANT
GLOVE BIOGEL PI IND STRL 7.0 (GLOVE) ×1 IMPLANT
GLOVE BIOGEL PI IND STRL 7.5 (GLOVE) ×2 IMPLANT
GLOVE BIOGEL PI INDICATOR 7.0 (GLOVE) ×1
GLOVE BIOGEL PI INDICATOR 7.5 (GLOVE) ×1
GLOVE ECLIPSE 7.0 STRL STRAW (GLOVE) ×3 IMPLANT
GLOVE SURG SS PI 6.5 STRL IVOR (GLOVE) ×4 IMPLANT
GOWN STRL REUS W/TWL LRG LVL3 (GOWN DISPOSABLE) ×7 IMPLANT
GUIDEWIRE STR DUAL SENSOR (WIRE) ×2 IMPLANT
IV NS 1000ML (IV SOLUTION) ×3
IV NS 1000ML BAXH (IV SOLUTION) ×1 IMPLANT
IV NS IRRIG 3000ML ARTHROMATIC (IV SOLUTION) ×16 IMPLANT
KIT BASIN OR (CUSTOM PROCEDURE TRAY) ×3 IMPLANT
LASER FIBER DISP 1000U (UROLOGICAL SUPPLIES) IMPLANT
MANIFOLD NEPTUNE II (INSTRUMENTS) ×3 IMPLANT
NEEDLE HYPO 22GX1.5 SAFETY (NEEDLE) ×2 IMPLANT
NS IRRIG 1000ML POUR BTL (IV SOLUTION) ×3 IMPLANT
PACK BASIC VI WITH GOWN DISP (CUSTOM PROCEDURE TRAY) ×3 IMPLANT
PACK CYSTO (CUSTOM PROCEDURE TRAY) ×1 IMPLANT
PAD ABD 7.5X8 STRL (GAUZE/BANDAGES/DRESSINGS) ×2 IMPLANT
PROBE LITHOCLAST ULTRA 3.8X403 (UROLOGICAL SUPPLIES) ×2 IMPLANT
PROBE PNEUMATIC 1.0MMX570MM (UROLOGICAL SUPPLIES) ×3 IMPLANT
SCRUB PCMX 4 OZ (MISCELLANEOUS) ×3 IMPLANT
SET IRRIG Y TYPE TUR BLADDER L (SET/KITS/TRAYS/PACK) ×3 IMPLANT
SET WARMING FLUID IRRIGATION (MISCELLANEOUS) ×1 IMPLANT
SPONGE GAUZE 4X4 12PLY (GAUZE/BANDAGES/DRESSINGS) ×1 IMPLANT
SPONGE LAP 4X18 X RAY DECT (DISPOSABLE) ×3 IMPLANT
STENT CONTOUR 6FRX28X.038 (STENTS) ×2 IMPLANT
STENT ENDOURETEROTOMY 7-14 26C (STENTS) IMPLANT
STONE CATCHER W/TUBE ADAPTER (UROLOGICAL SUPPLIES) ×3 IMPLANT
SUT SILK 2 0 30  PSL (SUTURE) ×1
SUT SILK 2 0 30 PSL (SUTURE) ×2 IMPLANT
SYR 20CC LL (SYRINGE) ×6 IMPLANT
SYR CONTROL 10ML LL (SYRINGE) ×2 IMPLANT
SYRINGE 10CC LL (SYRINGE) ×3 IMPLANT
SYSTEM UROSTOMY GENTLE TOUCH (WOUND CARE) ×4 IMPLANT
TOWEL OR NON WOVEN STRL DISP B (DISPOSABLE) ×3 IMPLANT
TRAY FOLEY CATH 14FRSI W/METER (CATHETERS) ×1 IMPLANT
TUBING CONNECTING 10 (TUBING) ×7 IMPLANT
WATER STERILE IRR 1500ML POUR (IV SOLUTION) ×1 IMPLANT
WIRE COONS/BENSON .038X145CM (WIRE) ×1 IMPLANT

## 2014-02-10 NOTE — Progress Notes (Signed)
Update  This patient had a burst age) nephrostolithotomy on Monday, 02/08/14. He is been observed in the hospital and he is scheduled today for second look right percutaneous nephrostolithotomy. He has a large remaining stone which is very dense. He is doing well and his labs looked great. This morning he had no complaints. Prior surgery today he stated he did have some sharp pain) the center of his chest that is similar to indigestion. It is not associated with shortness of breath and it does not radiate to his shoulder were his left arm.   Filed Vitals:   02/10/14 0448  BP: 128/74  Pulse: 73  Temp: 98.3 F (36.8 C)  Resp: 16    Vital signs are all stable.  Gen. NAD Abd: Soft, NTTP CV: RRR, S1/S2 Chest: CTA-B, no wheezes.  A/P: Right staghorn renal stone.  Proceed as planned for right percutaneous second look nephrostolithotomy.

## 2014-02-10 NOTE — Progress Notes (Deleted)
An empty bottle of hand sanitizer was found under patient's bed, with a hole at the bottom. It appeared like a small hole at the bottom of the bottle was bitten off. The patient most likely drink the whole container. Patient is sleeping now due to ativan that was given to him. Otherwise, no untoward signs and symptoms noted. Informed MD on call with orders made and carried out. Will continue to monitor patient.

## 2014-02-10 NOTE — Progress Notes (Signed)
Urology Progress Note  Subjective:     No acute urologic events overnight. Continues to ambulate. Positive flatus and BM. Right flank pain improved and better controlled.  ROS: Negative: fever or SOB.  Objective:  Patient Vitals for the past 24 hrs:  BP Temp Temp src Pulse Resp SpO2  02/10/14 0448 128/74 mmHg 98.3 F (36.8 C) Oral 73 16 95 %  02/09/14 2119 122/65 mmHg 97.7 F (36.5 C) Oral 65 20 96 %  02/09/14 1356 126/63 mmHg 97.4 F (36.3 C) Oral 67 18 98 %    Physical Exam: General:  No acute distress, awake Cardiovascular:    [x]   S1/S2 present, RRR  []   Irregularly irregular Chest:  CTA-B Abdomen:               []  Soft, appropriately TTP  [x]  Soft, NTTP, ND.  []  Soft, appropriately TTP, incision(s) clean/dry/intact  Genitourinary: Right flank dressed without ecchymosis. Foley and right nephrostomy tube draining dark pink urine.     I/O last 3 completed shifts: In: 5850 [P.O.:1800; I.V.:4050] Out: 7147 [Urine:7145; Stool:2]  Recent Labs     02/08/14  0800   02/09/14  0425  02/10/14  0444  HGB  15.7   < >  14.1  12.8*  WBC  7.4   --    --    --   PLT  166   --    --    --    < > = values in this interval not displayed.    Recent Labs     02/09/14  0425  02/10/14  0444  NA  142  140  K  4.8  4.3  CL  104  104  CO2  27  27  BUN  9  8  CREATININE  0.85  0.97  CALCIUM  8.7  8.7  GFRNONAA  >90  >90  GFRAA  >90  >90     Recent Labs     02/08/14  0800  INR  1.00  APTT  26     No components found with this basename: ABG,     Length of stay: 2 days.  Assessment: Right staghorn nephrolithiasis. POD#2 Right percutaneous nephrostolithotomy.   Plan: -Plan for second look right PCNL today in the OR. -IV ampicillin on call to OR. -Has been NPO since midnight.   Natalia Leatherwoodaniel Kalisha Keadle, MD [phone]

## 2014-02-10 NOTE — Anesthesia Postprocedure Evaluation (Signed)
  Anesthesia Post-op Note  Patient: Christopher ChardBarry Muckle  Procedure(s) Performed: Procedure(s) with comments: RIGHT NEPHROLITHOTOMY PERCUTANEOUS SECOND LOOK (Right) - 2ND STAGE (RT) PCNL   Patient Location: PACU  Anesthesia Type:General  Level of Consciousness: awake, alert , oriented and patient cooperative  Airway and Oxygen Therapy: Patient Spontanous Breathing and Patient connected to nasal cannula oxygen  Post-op Pain: mild  Post-op Assessment: PATIENT'S CARDIOVASCULAR STATUS UNSTABLE  Post-op Vital Signs: Reviewed  Last Vitals:  Filed Vitals:   02/10/14 1619  BP:   Pulse:   Temp: 37.3 C  Resp:     Complications: cardiovascular complications

## 2014-02-10 NOTE — Progress Notes (Addendum)
Progress Note  I was contacted by Dr. Rica MastFortune who let me know that the patient entered into atrial fibrillation with rapid ventricular response.  He plan on sending cardiac enzymes and a basic metabolic panel.  I asked him to send an H&H as well.  He will administer metoprolol.  Patient feels anxious. No chest pain or SOB.    A/P: A-fib with RVR. Status-post right percutaneous nephrostolithotomy.  I spoke with the on-call cardiologist and requested a consult.  Patient will be transferred to the stepdown unit.  Family was notified.  I have requested to avoid anticoagulation if possible due to the high risk of bleeding.  Natalia Leatherwoodaniel Seriyah Collison, MD Pager [phone]

## 2014-02-10 NOTE — Progress Notes (Deleted)
RN called. Patient ingested an entire container of hand sanitizer. (The container was found empty under his bed). I checked with [facility] of the [place]. There is nothing that will remove the alcohol from his system. They simply suggested an alcohol level, as well as CBGs to ensure his blood sugar does not drop.  Sydell Prowell York, PA-C  [facility]  Pager: [phone]   

## 2014-02-10 NOTE — Consult Note (Signed)
Admit date: 02/08/2014 Referring Physician  Dr. Margarita GrizzleWoodruff Primary Physician Rudi HeapMOORE, DONALD, MD Primary Cardiologist  none Reason for Consultation  atrial fibrillation rapid ventricular response postoperative  HPI: 53 year old male with no prior history of atrial fibrillation who underwent right percutaneous nephrostolithotomy secondary to large dense stone.  When reviewing prior notes, he stated that he did have some sharp pain in the center of his chest similar to indigestion prior to surgery. No associated shortness of breath or radiation of symptoms.  Following urologic procedure in postoperative setting, he developed atrial fibrillation with rapid ventricular response which was treated with esmolol, beta blocker. Heart rate was reasonably controlled. Blood pressures remained stable.  He denies any palpitations but according to nurse taking care of him in PACU his wife states that he may have had occasional palpitations in the past.  PMH:   Past Medical History  Diagnosis Date  . Hypertension   . Hyperlipidemia   . Complication of anesthesia     slow to wake up  . History of kidney stones     PSH:   Past Surgical History  Procedure Laterality Date  . Knee surgery      BILATERAL  . Kidney stone surgery      20 YRS AGO  . Nephrolithotomy Right 02/08/2014    Procedure: NEPHROLITHOTOMY PERCUTANEOUS;  Surgeon: Milford Cageaniel Young Woodruff, MD;  Location: [facility];  Service: Urology;  Laterality: Right;  1ST STAGE (RT) PCNL      . Cystoscopy with ureteroscopy Right 02/08/2014    Procedure: Cystoscopy Left retrograde pyleogram, right antegrade pyleogram;  Surgeon: Milford Cageaniel Young Woodruff, MD;  Location: [facility];  Service: Urology;  Laterality: Right;   Allergies:  Review of patient's allergies indicates no known allergies. Prior to Admit Meds:   Prior to Admission medications   Medication Sig Start Date End Date Taking? Authorizing Provider  lisinopril-hydrochlorothiazide  (PRINZIDE,ZESTORETIC) 20-25 MG per tablet Take 1 tablet by mouth every morning.    Historical Provider, MD   Fam HX:   History reviewed. No pertinent family history. Both father and mother died in her 2870s from congestive heart failure. No early family history of CAD.  Social HX:   Denies any alcohol use, decongestant use. History   Social History  . Marital Status: Married    Spouse Name: N/A    Number of Children: N/A  . Years of Education: N/A   Occupational History  . Not on file.   Social History Main Topics  . Smoking status: Former Smoker    Quit date: 01/29/1993  . Smokeless tobacco: Not on file  . Alcohol Use: No  . Drug Use: No  . Sexual Activity: Not on file   Other Topics Concern  . Not on file   Social History Narrative  . No narrative on file     ROS:  Chest pain as noted above prior to procedure, no significant forms of breath, no fevers, no chills, no bleeding, no orthopnea, no strokelike symptoms. No prior cardiovascular history. Positive for snoring. It is been suggested in the past that he get tested for sleep apnea. This can be a risk factor for atrial fibrillation. All 11 ROS were addressed and are negative except what is stated in the HPI   Physical Exam: Blood pressure 128/74, pulse 73, temperature 99.1 F (37.3 C), temperature source Oral, resp. rate 16, height 6\' 1"  (1.854 m), weight 281 lb (127.461 kg), SpO2 95.00%.   General: Well developed, well nourished, in no  acute distress Head: Eyes PERRLA, No xanthomas.  Thick neck. Normal cephalic and atramatic  Lungs:   Clear bilaterally to auscultation and percussion. Normal respiratory effort. No wheezes, no rales. Heart:   Irregularly irregular, tachycardic Pulses are 2+ & equal. No murmur, rubs, gallops.  No carotid bruit. No JVD.  No abdominal bruits.  Abdomen: Bowel sounds are positive, abdomen soft and non-tender without masses. No hepatosplenomegaly. Overweight Msk:  Back normal. Normal strength  and tone for age. Nephrostomy. Extremities:  No clubbing, cyanosis or edema.  DP +1 Neuro: Alert and oriented X 3, non-focal, MAE x 4 GU: Deferred Rectal: Deferred Psych:  Good affect, responds appropriately      Labs: Lab Results  Component Value Date   WBC 7.4 02/08/2014   HGB 12.8* 02/10/2014   HCT 38.4* 02/10/2014   MCV 88.8 02/08/2014   PLT 166 02/08/2014     Recent Labs Lab 02/10/14 0444  NA 140  K 4.3  CL 104  CO2 27  BUN 8  CREATININE 0.97  CALCIUM 8.7  GLUCOSE 102*     EKG:  02/10/14-atrial fibrillation heart rate 129 beats per minute, rapid ventricular response with nonspecific ST-T wave changes Personally viewed.   ASSESSMENT/PLAN:   53 year old postoperative atrial fibrillation with rapid ventricular response after urologic procedure with comorbidities of obesity, hypertension.  1. Atrial fibrillation with rapid ventricular response-I would recommend placement in step down, 1200 unit overnight for continued observation and possible titration of medication. Beta blocker, esmolol, seemed to work fairly well in controlling his rate.  A more common alternative used his diltiazem/diltiazem drip which he is currently obtaining. He received diltiazem 20 mg IV x1 and now he is on a drip at 15 per hour. His heart rate currently is in the 120s to 130s, mildly improved. If over the next hour he does not improve further, i.e. heart rate less than 110-100, I would consider metoprolol 5 mg IV every 4 hours. Perhaps he will respond better to beta blocker.   I would not be surprised if he does not have conversion within the next 24-48 hours back to sinus rhythm. After that, maintenance diltiazem such as 120 mg CD may be helpful.  Cardiac markers are being drawn. TSH would not likely be very helpful in the setting of his acute illness.  Strongly consider sleep study as an outpatient.  No anticoagulation. CHADS-VASc = 1 (HTN). Continue to encourage weight loss.  We will continue  to follow.  Donato SchultzMark Cade Olberding, MD  02/10/2014  5:09 PM

## 2014-02-10 NOTE — Progress Notes (Signed)
Called to PACU to evaluate patient immediately postop with new onset SVT. Patient given Esmolol 50mg  IV push in PACU for caridac rate 130-150bpm. Rate diminished to <100bpm with rhythm consistent with atrial fibrillation with RVR. Surgeon made aware. Will plan on monitored bed placement and obtaining serial Troponin levels on floor.

## 2014-02-10 NOTE — Op Note (Signed)
Urology Operative Report  Date of Procedure: 02/10/14  Surgeon: Natalia Leatherwoodaniel Nadina Fomby, MD Assistant:  None  Preoperative Diagnosis: Right staghorn kidney stone (>2 cm) Postoperative Diagnosis:  Same  Procedure(s): 2nd stage right percutaneous nephrostolithotomy. Right antegrade ureter stent placement. Right ureteroscopy. Right antegrade pyelogram with interpretation.  Estimated blood loss: 50cc  Specimen: Stones provided to the patient.  Drains: Right percutaneous nephrostomy tube (22Fr). Foley- 18Fr.  Complications: None  Findings: Large right renal stone burden.  History of present illness: 53 year old male presents today for second stage of a right percutaneous nephrostolithotomy. His stone is larger than 2 cm in size. First stage was 2 days ago.   Procedure in detail: After informed consent was obtained, the patient was taken to the operating room. They were placed in the supine position. SCDs were turned on and in place. IV antibiotics were infused, and general anesthesia was induced. A timeout was performed in which the correct patient, surgical site, and procedure were identified and agreed upon by the team.  The patient was placed in a prone position, making sure to pad all pertinent neurovascular pressure points. He was placed on a horizontal large gel rolls to support at his chest and his pelvis. Knees were slightly bent and supported with gel. Arms were positioned to avoid any pressure on neurovascular locations. The right flank and nephrostomy tube were prepped and draped in the usual sterile fashion.  The nephrostomy tube was visualized on fluoroscopy. A superstiff wire was placed down the right nephroureteral catheter into the bladder on fluoroscopy. The nephroureteral catheter was removed and the wire was secured as a safety wire. The nephrostomy tube was then deflated.  A dual-lumen access sheath was placed over the safety wire and once in the renal pelvis I injected 10  cc of Omnipaque to obtain an antegrade pyelogram on the right side. There was extravasation as expected the nephrostomy tube tract as well as good flow down the proximal ureter. I then placed a second superstiff wire down the ureter under fluoroscopy and into the bladder on fluoroscopy. Both wires were secured to the drape.  A flexible nephroscope was then advanced through the nephrostomy site and into the kidney. I had placed the nephrostomy access sheath over the cystoscope and this was raised over the cystoscope into the renal collecting system. I then withdrew the flexible nephroscope in place a rigid nephroscope. Stones were identified and small fragments were removed with three-pronged grasper. Identify the large stone which was very dense. I carried out lithotripsy with ultrasonic and pneumatic power. The stone took a long time to break due to the density. Finally a broken smaller pieces and these were grasped with a three-pronged grasper. Once all the stone fragments were removed that I could visualize I replaced the flexible nephroscope I noted some stones migrated into the proximal right ureter.  I performed right antegrade ureteroscopy with flexible ureter scope. 2 stone fragments were noted in the ureter and removed with a basket. I then navigated all the way down the ureter and there were no more stones. I withdrew with a superstiff wires and placed a sensor wire through the ureter scope and then withdrew the ureter scope through the sheath.  Then loaded the sensor wire through the rigid nephroscope and placed a 6 x 28 double-J ureter stent with ease under direct visualization and fluoroscopy down the ureter. This was deployed with a curl in the bladder on fluoroscopy and a curl in the renal pelvis.  I then placed  a sensor wire into the proximal ureter and withdrew this. I placed a 22 [place] council-tip catheter over the wire under fluoroscopy into the right renal pelvis. This was inflated with  approximately 2.5 cc of half-and-half Omnipaque/saline. I then injected 15 cc of Omnipaque/saline and the suture there was good seating of the nephrostomy balloon in the lower mid calyx. The nephrostomy sheath was removed and the nephrostomy tube was sutured into place with a silk suture. There was no return of blood and no evidence of hemorrhage.  I then withdrew the balloon remaining wire that was down the ureter under fluoroscopy. The nephrostomy tube and ureter stent remained in good position.  I then injected quarter percent Marcaine around the flank area a total of 30 cc. A urostomy device was then placed over the nephrostomy tube site.  This completed the procedure. He was placed back in a supine position, anesthesia was reversed, [facility]  All counts were correct at the end of the case.  He will be taken back to the hospital for her he will remain an inpatient overnight.

## 2014-02-10 NOTE — Anesthesia Preprocedure Evaluation (Signed)
Anesthesia Evaluation  Patient identified by MRN, date of birth, ID band Patient awake    Reviewed: Allergy & Precautions, H&P , NPO status , Patient's Chart, lab work & pertinent test results  History of Anesthesia Complications (+) PROLONGED EMERGENCE and history of anesthetic complications  Airway Mallampati: II TM Distance: >3 FB Neck ROM: Full    Dental no notable dental hx.    Pulmonary neg pulmonary ROS, former smoker,  breath sounds clear to auscultation  Pulmonary exam normal       Cardiovascular Exercise Tolerance: Good hypertension, Pt. on medications Rhythm:Regular Rate:Normal     Neuro/Psych negative neurological ROS  negative psych ROS   GI/Hepatic negative GI ROS, Neg liver ROS,   Endo/Other  negative endocrine ROS  Renal/GU Renal diseasenegative Renal ROS  negative genitourinary   Musculoskeletal negative musculoskeletal ROS (+)   Abdominal (+) + obese,   Peds negative pediatric ROS (+)  Hematology negative hematology ROS (+)   Anesthesia Other Findings   Reproductive/Obstetrics negative OB ROS                           Anesthesia Physical Anesthesia Plan  ASA: II  Anesthesia Plan: General   Post-op Pain Management:    Induction: Intravenous  Airway Management Planned: Oral ETT  Additional Equipment:   Intra-op Plan:   Post-operative Plan: Extubation in OR  Informed Consent: I have reviewed the patients History and Physical, chart, labs and discussed the procedure including the risks, benefits and alternatives for the proposed anesthesia with the patient or authorized representative who has indicated his/her understanding and acceptance.   Dental advisory given  Plan Discussed with: CRNA  Anesthesia Plan Comments:         Anesthesia Quick Evaluation

## 2014-02-10 NOTE — Transfer of Care (Signed)
Immediate Anesthesia Transfer of Care Note  Patient: Christopher ChardBarry Sem  Procedure(s) Performed: Procedure(s) with comments: RIGHT NEPHROLITHOTOMY PERCUTANEOUS SECOND LOOK (Right) - 2ND STAGE (RT) PCNL   Patient Location: PACU  Anesthesia Type:General  Level of Consciousness: awake and oriented  Airway & Oxygen Therapy: Patient Spontanous Breathing and Patient connected to face mask oxygen  Post-op Assessment: Report given to PACU RN and Noted A-fib rhythm on monitor with RVR rate 150-160  Post vital signs: stable  Complications: No apparent anesthesia complications

## 2014-02-11 ENCOUNTER — Encounter (HOSPITAL_COMMUNITY): Payer: Self-pay | Admitting: Urology

## 2014-02-11 ENCOUNTER — Inpatient Hospital Stay (HOSPITAL_COMMUNITY): Payer: BC Managed Care – PPO

## 2014-02-11 DIAGNOSIS — I517 Cardiomegaly: Secondary | ICD-10-CM

## 2014-02-11 LAB — HEMOGLOBIN AND HEMATOCRIT, BLOOD
HEMATOCRIT: 39.9 % (ref 39.0–52.0)
Hemoglobin: 13.5 g/dL (ref 13.0–17.0)

## 2014-02-11 LAB — BASIC METABOLIC PANEL
BUN: 6 mg/dL (ref 6–23)
CO2: 26 mEq/L (ref 19–32)
Calcium: 9 mg/dL (ref 8.4–10.5)
Chloride: 102 mEq/L (ref 96–112)
Creatinine, Ser: 0.82 mg/dL (ref 0.50–1.35)
Glucose, Bld: 140 mg/dL — ABNORMAL HIGH (ref 70–99)
POTASSIUM: 3.8 meq/L (ref 3.7–5.3)
SODIUM: 140 meq/L (ref 137–147)

## 2014-02-11 LAB — TROPONIN I: Troponin I: <0.3 ng/mL (ref <0.30)

## 2014-02-11 MED ORDER — SODIUM CHLORIDE 0.9 % IV SOLN
INTRAVENOUS | Status: DC
  Administered 2014-02-11 (×2): via INTRAVENOUS

## 2014-02-11 MED ORDER — PERFLUTREN LIPID MICROSPHERE
1.0000 mL | INTRAVENOUS | Status: AC | PRN
  Filled 2014-02-11: qty 10

## 2014-02-11 MED ORDER — METOPROLOL SUCCINATE 12.5 MG HALF TABLET
12.5000 mg | ORAL_TABLET | Freq: Every day | ORAL | Status: DC
  Filled 2014-02-11: qty 1

## 2014-02-11 MED ORDER — METOPROLOL TARTRATE 12.5 MG HALF TABLET
12.5000 mg | ORAL_TABLET | Freq: Two times a day (BID) | ORAL | Status: DC
  Administered 2014-02-11: 12.5 mg via ORAL
  Filled 2014-02-11 (×2): qty 1

## 2014-02-11 NOTE — Progress Notes (Addendum)
Urology Progress Note  Subjective:      This patient developed atrial fibrillation with rapid ventricular response in the recovery room.  Cardiology was consult and he's been rate controlled.  He states that he has some mild palpitations, but no chest pain or shortness of breath.  He is able to maintain his blood pressure.  He feels bloated.  Positive flatus.  Negative bowel movement.  Right flank is sore but pain is controlled.   ROS: Negative: fever or SOB.  Objective:  Patient Vitals for the past 24 hrs:  BP Temp Temp src Pulse Resp SpO2  02/11/14 0600 109/66 mmHg - - 57 20 97 %  02/11/14 0500 108/70 mmHg - - 87 24 97 %  02/11/14 0400 94/57 mmHg 99.6 F (37.6 C) Oral 79 22 96 %  02/11/14 0300 97/52 mmHg - - 71 26 95 %  02/11/14 0200 95/57 mmHg - - 85 24 95 %  02/11/14 0100 104/65 mmHg - - 91 26 97 %  02/11/14 0000 97/64 mmHg - - 72 18 93 %  02/10/14 2300 119/69 mmHg - - 80 27 93 %  02/10/14 2200 120/74 mmHg - - 92 18 98 %  02/10/14 2100 136/83 mmHg - - 80 29 96 %  02/10/14 2000 124/78 mmHg - - 91 25 98 %  02/10/14 1900 119/69 mmHg - - 102 20 94 %  02/10/14 1833 122/82 mmHg 99.9 F (37.7 C) Oral 94 26 92 %  02/10/14 1800 138/71 mmHg 98.8 F (37.1 C) - 122 15 93 %  02/10/14 1750 - - - - 14 -  02/10/14 1745 - - - - 16 -  02/10/14 1740 - - - - 12 -  02/10/14 1735 - - - - - 95 %  02/10/14 1720 - - - - 12 -  02/10/14 1619 152/78 mmHg 99.1 F (37.3 C) - 164 20 100 %    Physical Exam: General:  No acute distress, awake Cardiovascular:    []   S1/S2 present, RRR  [x]   Irregularly irregular, no tachycardia. Chest:  CTA-B Abdomen:               []  Soft, appropriately TTP  [x]  Soft, NTTP, ND.  []  Soft, appropriately TTP, incision(s) clean/dry/intact  Genitourinary: Right flank with urostomy device without ecchymosis. Foley and right nephrostomy tube draining light pink urine.     I/O last 3 completed shifts: In: 5015 [P.O.:240; I.V.:4675; IV Piggyback:100] Out: 8725  [Urine:8725]  Recent Labs     02/08/14  0800   02/10/14  1703  02/11/14  0429  HGB  15.7   < >  14.1  13.5  WBC  7.4   --    --    --   PLT  166   --    --    --    < > = values in this interval not displayed.    Recent Labs     02/10/14  1703  02/11/14  0429  NA  140  140  K  4.2  3.8  CL  101  102  CO2  27  26  BUN  7  6  CREATININE  0.85  0.82  CALCIUM  9.1  9.0  GFRNONAA  >90  >90  GFRAA  >90  >90     Recent Labs     02/08/14  0800  INR  1.00  APTT  26     No components found with this basename: ABG,  Length of stay: 3 days.    Assessment:  Right staghorn nephrolithiasis.  A-fib w/ RVR. POD#3 Right percutaneous nephrostolithotomy.   Plan: -CT shows residual stones of 1.2 & 1.2 cm in size at acute angles to the nephrostomy tube. Options include a 3rd PCNL vs ureteroscopy in an interval fashion. After discussion with the patient and his wife they would prefer ureteroscopy in an interval fashion. He has an indwelling double-J right ureter stent.  -I removed his right nephrostomy tube today.  Discontinue Foley catheter by nursing staff.  -D/c D5NS; start NS at 75cc/hr.  -Continue heart rate control per cardiology; I appreciate their assistance.   -Patient will likely need to remain in stepdown until he is no longer requiring an IV drip, but if cardiology feels he can go to the floor, it is fine from a GU point of view.   Natalia Leatherwoodaniel Abdulwahab Demelo, MD [phone]  ADDENDUM: His CT is inaccurate due to the fact that he had contrast injected into his nephrostomy tube right before the procedure. I spoke with the interpreting radiologist. Repeat KUB shows no residual fragments, but I suspect there may be some small remaining fragments. At any rate, he does not have two large stone fragments as documented above. Findings relayed to the patient.

## 2014-02-11 NOTE — Progress Notes (Signed)
  Echocardiogram 2D Echocardiogram with Definity has been performed.  Lorn Junesngela D Carry Ortez 02/11/2014, 1:06 PM

## 2014-02-11 NOTE — Progress Notes (Signed)
Notified MD that R nephrostomy has had scant output since this A.M. Pt has been able to void since foley was taken out this A.M. No new orders. Will continue to monitor pt.

## 2014-02-11 NOTE — Progress Notes (Signed)
Pt still in Afib, rate controlled on 5mg  cardizem until approx 3am pt had two brief episodes of bradycardia in the 30's accompanied with pauses longer than 2.5 seconds. Blood pressure stable (97/52) Cardizem drip turned off. MD on called notified. No new orders given. Will continue to monitor.

## 2014-02-11 NOTE — Progress Notes (Signed)
Received from ICU, alert and oriented. R nephrostomy to drainage bag, with scant serosanguinous output. Agree to previous RN assessment.

## 2014-02-11 NOTE — Progress Notes (Signed)
SUBJECTIVE:  No complaints this am  OBJECTIVE:   Vitals:   Filed Vitals:   02/11/14 0400 02/11/14 0500 02/11/14 0600 02/11/14 0700  BP: 94/57 108/70 109/66 115/73  Pulse: 79 87 57 83  Temp: 99.6 F (37.6 C)     TempSrc: Oral     Resp: 22 24 20 21   Height:      Weight:      SpO2: 96% 97% 97% 96%   I&O's:   Intake/Output Summary (Last 24 hours) at 02/11/14 4098 Last data filed at 02/11/14 0753  Gross per 24 hour  Intake 3410.42 ml  Output   7350 ml  Net -3939.58 ml   TELEMETRY: Reviewed telemetry pt in atrial fibrillation with HR 100-110bpm:     PHYSICAL EXAM General: Well developed, well nourished, in no acute distress Head: Eyes PERRLA, No xanthomas.   Normal cephalic and atramatic  Lungs:   Clear bilaterally to auscultation and percussion. Heart:   Irregularly irregular S1 S2 Pulses are 2+ & equal. Abdomen: Bowel sounds are positive, abdomen soft and non-tender without masses  Extremities:   No clubbing, cyanosis or edema.  DP +1 Neuro: Alert and oriented X 3. Psych:  Good affect, responds appropriately   LABS: Basic Metabolic Panel:  Recent Labs  11/91/47 1703 02/11/14 0429  NA 140 140  K 4.2 3.8  CL 101 102  CO2 27 26  GLUCOSE 130* 140*  BUN 7 6  CREATININE 0.85 0.82  CALCIUM 9.1 9.0   Liver Function Tests:  Recent Labs  02/10/14 1703  AST 13  ALT 17  ALKPHOS 58  BILITOT 0.5  PROT 6.7  ALBUMIN 3.7   No results found for this basename: LIPASE, AMYLASE,  in the last 72 hours CBC:  Recent Labs  02/10/14 1703 02/11/14 0429  HGB 14.1 13.5  HCT 41.8 39.9   Cardiac Enzymes:  Recent Labs  02/10/14 1703 02/10/14 2332 02/11/14 0429  TROPONINI <0.30 <0.30 <0.30   BNP: No components found with this basename: POCBNP,  D-Dimer: No results found for this basename: DDIMER,  in the last 72 hours Hemoglobin A1C: No results found for this basename: HGBA1C,  in the last 72 hours Fasting Lipid Panel: No results found for this basename:  CHOL, HDL, LDLCALC, TRIG, CHOLHDL, LDLDIRECT,  in the last 72 hours Thyroid Function Tests: No results found for this basename: TSH, T4TOTAL, FREET3, T3FREE, THYROIDAB,  in the last 72 hours Anemia Panel: No results found for this basename: VITAMINB12, FOLATE, FERRITIN, TIBC, IRON, RETICCTPCT,  in the last 72 hours Coag Panel:   Lab Results  Component Value Date   INR 1.00 02/08/2014    RADIOLOGY: Dg Abd 1 View  02/10/2014   CLINICAL DATA:  Right renal stone removal and stent placement.  EXAM: ABDOMEN - 1 VIEW  COMPARISON:  DG RETROGRADE PYELOGRAM dated 02/08/2014  FINDINGS: Two fluoroscopic spot images demonstrate double-J right ureteral stent, with distal loop reconstituted in the urinary bladder. The proximal loop is not reconstituted and is situated by a large poor nephrostomy tube.  IMPRESSION: Right nephrostomy placement with right ureteral stent.   Electronically Signed   By: Andreas Newport M.D.   On: 02/10/2014 16:49   Dg Retrograde Pyelogram  02/08/2014   CLINICAL DATA:  Right staghorn calculus.  EXAM: RETROGRADE PYELOGRAM  COMPARISON:  Percutaneous catheter placement 01/2014  FINDINGS: There is a right nephroureteral catheter that extends into the bladder. A left retrograde pyelogram was performed. The mid and distal aspects of the  left ureter were opacified and appear to be normal. Partial opacification of the proximal left ureter and left renal pelvis. Incomplete opacification of the left renal calices. Left renal collecting system is decompressed.  IMPRESSION: Normal appearance of the mid and distal left ureter. Incomplete opacification of the proximal left ureter and left upper renal collecting system as described.   Electronically Signed   By: Richarda OverlieAdam  Henn M.D.   On: 02/08/2014 16:18   Ir Nephrostogram Right  02/08/2014   CLINICAL DATA:  Right staghorn calculus  EXAM: ULTRASOUND GUIDANCE FOR NEPHROSTOMY ACCESS  RIGHT NEPHROSTOMY ACCESS PRIOR TO OPERATIVE NEPHROLITHOTOMY  ANTEGRADE  NEPHROSTOGRAM  Date:  02/08/2014 10:23 AM4/20/2015 10:23 AM  Radiologist:  Judie PetitM. Ruel Favorsrevor Shick, MD  Guidance:  Ultrasound of fluoroscopic  FLUOROSCOPY TIME:  2 min 24 seconds  MEDICATIONS AND MEDICAL HISTORY: Performed mg Cipro administered within 1 hr of the procedure, 4 mg Versed, 100 mcg fentanyl  ANESTHESIA/SEDATION: 15 min  CONTRAST:  10 cc Omnipaque 300  COMPLICATIONS: No immediate  PROCEDURE: Informed consent was obtained from the patient following explanation of the procedure, risks, benefits and alternatives. The patient understands, agrees and consents for the procedure. All questions were addressed. A time out was performed.  Maximal barrier sterile technique utilized including caps, mask, sterile gowns, sterile gloves, large sterile drape, hand hygiene, and ChloraPrep.  Previous imaging reviewed. This demonstrated a large right staghorn calculus predominately in the lower pole.  Patient positioned prone. Preliminary ultrasound performed. Lower pole staghorn calculus was localized with ultrasound. Under sterile conditions and local anesthesia, an 18 gauge 15 cm access needle was advanced under direct ultrasound into the lower pole calyx containing the echogenic shadowing calculus. Needle position confirmed on the staghorn calculus with ultrasound and fluoroscopy. The calculus could be felt on the needle tip. There was return of urine from the needle. Guidewire advanced adjacent to the staghorn calculus. Accustick dilator set advanced. Through the outer dilator, antegrade nephrostogram performed.  Right nephrostogram: This demonstrates partial duplication of the right ureter to the level of the iliac crest where the ureter becomes a single ureter into the pelvis. No ureteral obstruction or stone.  Bentson guidewire inserted followed by a Kumpe catheter. Access was advanced into the bladder. Position confirmed in gallbladder with a contrast injection. Images obtained for documentation. Five French catheter  remains as nephrostomy access prior to operative nephrolithotomy. This was secured externally with a Prolene suture. Sterile dressing applied. No immediate complication. Patient tolerated the procedure well.  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   On: 02/08/2014 11:00   Ir [place] Guide Bx Asp/drain  02/08/2014   CLINICAL DATA:  Right staghorn calculus  EXAM: ULTRASOUND GUIDANCE FOR NEPHROSTOMY ACCESS  RIGHT NEPHROSTOMY ACCESS PRIOR TO OPERATIVE NEPHROLITHOTOMY  ANTEGRADE NEPHROSTOGRAM  Date:  02/08/2014 10:23 AM4/20/2015 10:23 AM  Radiologist:  Judie PetitM. Ruel Favorsrevor Shick, MD  Guidance:  Ultrasound of fluoroscopic  FLUOROSCOPY TIME:  2 min 24 seconds  MEDICATIONS AND MEDICAL HISTORY: Performed mg Cipro administered within 1 hr of the procedure, 4 mg Versed, 100 mcg fentanyl  ANESTHESIA/SEDATION: 15 min  CONTRAST:  10 cc Omnipaque 300  COMPLICATIONS: No immediate  PROCEDURE: Informed consent was obtained from the patient following explanation of the procedure, risks, benefits and alternatives. The patient understands, agrees and consents for  the procedure. All questions were addressed. A time out was performed.  Maximal barrier sterile technique utilized including caps, mask, sterile gowns, sterile gloves, large sterile drape, hand hygiene, and ChloraPrep.  Previous imaging reviewed. This demonstrated a large right staghorn calculus predominately in the lower pole.  Patient positioned prone. Preliminary ultrasound performed. Lower pole staghorn calculus was localized with ultrasound. Under sterile conditions and local anesthesia, an 18 gauge 15 cm access needle was advanced under direct ultrasound into the lower pole calyx containing the echogenic shadowing calculus. Needle position  confirmed on the staghorn calculus with ultrasound and fluoroscopy. The calculus could be felt on the needle tip. There was return of urine from the needle. Guidewire advanced adjacent to the staghorn calculus. Accustick dilator set advanced. Through the outer dilator, antegrade nephrostogram performed.  Right nephrostogram: This demonstrates partial duplication of the right ureter to the level of the iliac crest where the ureter becomes a single ureter into the pelvis. No ureteral obstruction or stone.  Bentson guidewire inserted followed by a Kumpe catheter. Access was advanced into the bladder. Position confirmed in gallbladder with a contrast injection. Images obtained for documentation. Five French catheter remains as nephrostomy access prior to operative nephrolithotomy. This was secured externally with a Prolene suture. Sterile dressing applied. No immediate complication. Patient tolerated the procedure well.  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   On: 02/08/2014 11:00   Dg C-arm 61-120 Min-no Report  02/10/2014   CLINICAL DATA: right kidney stone   C-ARM 61-120 MINUTES  Fluoroscopy was utilized by the requesting physician.  No radiographic  interpretation.    Dg C-arm 61-120 Min-no Report  02/08/2014   CLINICAL DATA: kidney stone   C-ARM 61-120 MINUTES  Fluoroscopy was utilized by the requesting physician.  No radiographic  interpretation.    Ir [place] Abts Cath Perc Right  02/08/2014   CLINICAL DATA:  Right staghorn calculus  EXAM: ULTRASOUND GUIDANCE FOR NEPHROSTOMY ACCESS  RIGHT NEPHROSTOMY ACCESS PRIOR TO OPERATIVE NEPHROLITHOTOMY  ANTEGRADE NEPHROSTOGRAM  Date:  02/08/2014 10:23 AM4/20/2015 10:23 AM  Radiologist:  Judie Petit. Ruel Favors, MD  Guidance:   Ultrasound of fluoroscopic  FLUOROSCOPY TIME:  2 min 24 seconds  MEDICATIONS AND MEDICAL HISTORY: Performed mg Cipro administered within 1 hr of the procedure, 4 mg Versed, 100 mcg fentanyl  ANESTHESIA/SEDATION: 15 min  CONTRAST:  10 cc Omnipaque 300  COMPLICATIONS: No immediate  PROCEDURE: Informed consent was obtained from the patient following explanation of the procedure, risks, benefits and alternatives. The patient understands, agrees and consents for the procedure. All questions were addressed. A time out was performed.  Maximal barrier sterile technique utilized including caps, mask, sterile gowns, sterile gloves, large sterile drape, hand hygiene, and ChloraPrep.  Previous imaging reviewed. This demonstrated a large right staghorn calculus predominately in the lower pole.  Patient positioned prone. Preliminary ultrasound performed. Lower pole staghorn calculus was localized with ultrasound. Under sterile conditions and local anesthesia, an 18 gauge 15 cm access needle was advanced under direct ultrasound into the lower pole calyx containing the echogenic shadowing calculus. Needle position confirmed on the staghorn calculus with ultrasound and fluoroscopy. The calculus could be felt on the needle tip. There was return of urine from the needle. Guidewire advanced adjacent to the staghorn calculus. Accustick dilator set  advanced. Through the outer dilator, antegrade nephrostogram performed.  Right nephrostogram: This demonstrates partial duplication of the right ureter to the level of the iliac crest where the ureter becomes a single ureter into the pelvis. No ureteral obstruction or stone.  Bentson guidewire inserted followed by a Kumpe catheter. Access was advanced into the bladder. Position confirmed in gallbladder with a contrast injection. Images obtained for documentation. Five French catheter remains as nephrostomy access prior to operative nephrolithotomy. This was secured externally with a Prolene  suture. Sterile dressing applied. No immediate complication. Patient tolerated the procedure well.  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   On: 02/08/2014 11:00    ASSESSMENT/PLAN:  53 year old postoperative atrial fibrillation with rapid ventricular response after urologic procedure with comorbidities of obesity, hypertension.  1. Atrial fibrillation with rapid ventricular response-He remains in afib with HR in the low 100's.  He is now off cardizem gtt due to HR briefly in the 30's while asleep last PM.  I will add Lopressor 12.5mg  BID for better HR control.   I would not be surprised if he does not have conversion within the next 24-48 hours back to sinus rhythm.   Cardiac markers are normal. Will get a 2D echo to assess LVF/LA size  Strongly consider sleep study as an outpatient.  No anticoagulation. CHADS-VASc = 1 (HTN) unless afib persists. Continue to encourage weight loss.  We will continue to follow.    Quintella Reichertraci R Turner, MD  02/11/2014  8:28 AM

## 2014-02-12 ENCOUNTER — Telehealth: Payer: Self-pay | Admitting: Cardiology

## 2014-02-12 DIAGNOSIS — E669 Obesity, unspecified: Secondary | ICD-10-CM

## 2014-02-12 DIAGNOSIS — I10 Essential (primary) hypertension: Secondary | ICD-10-CM

## 2014-02-12 DIAGNOSIS — I480 Paroxysmal atrial fibrillation: Secondary | ICD-10-CM

## 2014-02-12 LAB — BASIC METABOLIC PANEL
BUN: 10 mg/dL (ref 6–23)
CHLORIDE: 99 meq/L (ref 96–112)
CO2: 26 mEq/L (ref 19–32)
Calcium: 9.3 mg/dL (ref 8.4–10.5)
Creatinine, Ser: 0.87 mg/dL (ref 0.50–1.35)
Glucose, Bld: 106 mg/dL — ABNORMAL HIGH (ref 70–99)
Potassium: 3.8 mEq/L (ref 3.7–5.3)
Sodium: 137 mEq/L (ref 137–147)

## 2014-02-12 LAB — HEMOGLOBIN AND HEMATOCRIT, BLOOD
HCT: 39.7 % (ref 39.0–52.0)
Hemoglobin: 13.6 g/dL (ref 13.0–17.0)

## 2014-02-12 MED ORDER — BISACODYL 10 MG RE SUPP
10.0000 mg | Freq: Two times a day (BID) | RECTAL | Status: DC
Start: 2014-02-12 — End: 2014-09-24

## 2014-02-12 MED ORDER — OXYCODONE HCL 5 MG PO TABS: 5.0000 mg | ORAL_TABLET | ORAL | Status: DC | PRN

## 2014-02-12 MED ORDER — METOPROLOL SUCCINATE 12.5 MG HALF TABLET: 12.5000 mg | ORAL_TABLET | Freq: Every day | ORAL | Status: DC

## 2014-02-12 MED ORDER — SENNOSIDES-DOCUSATE SODIUM 8.6-50 MG PO TABS: 1.0000 | ORAL_TABLET | Freq: Two times a day (BID) | ORAL | Status: DC

## 2014-02-12 NOTE — Progress Notes (Signed)
Urology Progress Note  Subjective:      No acute events overnight.   Converted to normal sinus rhythm yesterday at 10:30 am; remained in rhythm. Transferred out of stepdown to telemetry yesterday afternoon.  Voiding without difficulty. Has some flank spasms with voiding- likely due to stent.  No palpitations.   ROS: Negative: fever or SOB.  Objective:  Patient Vitals for the past 24 hrs:  BP Temp Temp src Pulse Resp SpO2  02/12/14 0453 120/86 mmHg 98.2 F (36.8 C) Oral 61 18 97 %  02/11/14 2111 130/70 mmHg 98.1 F (36.7 C) Oral 75 18 98 %  02/11/14 1841 138/65 mmHg 98.2 F (36.8 C) Oral 74 20 98 %  02/11/14 1700 106/69 mmHg - - 70 19 97 %  02/11/14 1600 120/58 mmHg 98.4 F (36.9 C) Oral 75 20 96 %  02/11/14 1500 118/77 mmHg - - 56 23 95 %  02/11/14 1400 111/77 mmHg - - - 15 97 %  02/11/14 1300 125/64 mmHg - - 67 20 99 %  02/11/14 1200 93/61 mmHg 98.9 F (37.2 C) Oral 59 19 98 %  02/11/14 1100 98/58 mmHg - - 68 13 98 %  02/11/14 1000 112/66 mmHg - - 97 20 98 %  02/11/14 0956 - - - 92 - -  02/11/14 0955 120/75 mmHg - - - - -  02/11/14 0900 115/60 mmHg - - 106 24 97 %  02/11/14 0800 106/66 mmHg 99.2 F (37.3 C) Oral 72 17 96 %    Physical Exam: General:  No acute distress, awake Cardiovascular:    [x]   S1/S2 present, RRR  []   Irregularly irregular, no tachycardia. Chest:  CTA-B Abdomen:               []  Soft, appropriately TTP  [x]  Soft, NTTP, ND.  []  Soft, appropriately TTP, incision(s) clean/dry/intact  Genitourinary: Right flank with urostomy device without ecchymosis. Scant fluid in right ostomy device. No foley.       I/O last 3 completed shifts: In: 2532.9 [I.V.:2332.9; IV Piggyback:200] Out: 6480 [Urine:6480]  Recent Labs     02/11/14  0429  02/12/14  0435  HGB  13.5  13.6    Recent Labs     02/11/14  0429  02/12/14  0435  NA  140  137  K  3.8  3.8  CL  102  99  CO2  26  26  BUN  6  10  CREATININE  0.82  0.87  CALCIUM  9.0  9.3   GFRNONAA  >90  >90  GFRAA  >90  >90     No results found for this basename: PT, INR, APTT,  in the last 72 hours   No components found with this basename: ABG,     Length of stay: 4 days.    Assessment:  Right staghorn nephrolithiasis.  A-fib w/ RVR. POD#4 Right percutaneous nephrostolithotomy.   Plan: -Normal sinus rhythm. Ok to d/c home from cardiology's point of view. Will d/c home w/ metoprolol XL 12.5 mg daily.  -Given instructions about nephrostomy site care.  -D/c home.   Natalia Leatherwoodaniel Virdie Penning, MD [phone]

## 2014-02-12 NOTE — Progress Notes (Signed)
Utilization review completed.  

## 2014-02-12 NOTE — Telephone Encounter (Signed)
Split night ordered. I will have form faxed and [facility] will send pack and call pt with appt. Do I need to call pt or is he aware of this?

## 2014-02-12 NOTE — Addendum Note (Signed)
Addended byOrlene Plum: Giordan Fordham H on: 02/12/2014 12:54 PM   Modules accepted: Orders

## 2014-02-12 NOTE — Discharge Summary (Signed)
Physician Discharge Summary  Patient ID: Christopher Phillips MRN: [id number] DOB/AGE: 55/01/1961 55 y.o.  Admit date: 02/08/2014 Discharge date: 02/12/2014  Admission Diagnoses:  Discharge Diagnoses:  Active Problems:   Staghorn kidney stones   Atrial fibrillation   Morbid obesity   Essential hypertension, benign   Discharged Condition: good  Hospital Course:  53 year old male was admitted on 02/08/14 for the first stage right-sided percutaneous nephrostolithotomy for staghorn renal stone.  He did well postoperatively and returned to the operating room as planned for second stage right percutaneous nephrostolithotomy on 02/10/14.  Postoperatively he had a CT scan which I initially thought contained large residual fragments, but he had actually had contrast injected through his nephrostomy tube follow-up KUB showed no significant fragment though I suspect there remains at least an 8 mm fragment in his kidney. While in the recovery room from that surgery his heart when into atrial fibrillation with rapid ventricular response.  Cardiology was counseled that and he was taken to the stepdown unit for Cardizem drip.  His rhythm responded appropriately but his heart rate dropped and he was switched to metoprolol.  He converted to normal sinus rhythm the following day while on metoprolol.  He was transferred to the telemetry floor and observed overnight.  His nephrostomy tube and Foley catheter had been removed on 02/11/14.  He voided without difficulty.  He did not have any significant flank pain.  He remained in normal sinus rhythm and was felt that he could be discharged home.  Consults: cardiology  Significant Diagnostic Studies: radiology: KUB: No significant stone fragment. and cardiac graphics: Telemetry: A-fib, ECG: A-fib and Echocardiogram: No clots.  Treatments: IV hydration, antibiotics: gentamycin and ampicillin, cardiac meds: metoprolol and diltiazem and surgery: Right percutaneous  nephrostolithotomy.  Discharge Exam: Blood pressure 120/86, pulse 61, temperature 98.2 F (36.8 C), temperature source Oral, resp. rate 18, height 6\' 1"  (1.854 m), weight 127.461 kg (281 lb), SpO2 97.00%. Refer to PE from progress note on date of discharge.  Disposition: Home-self care.  Discharge Orders   Future Orders Complete By Expires   Discharge patient  As directed        Medication List         bisacodyl 10 MG suppository  Commonly known as:  DULCOLAX  Place 1 suppository (10 mg total) rectally 2 (two) times daily.     lisinopril-hydrochlorothiazide 20-25 MG per tablet  Commonly known as:  PRINZIDE,ZESTORETIC  Take 1 tablet by mouth every morning.     metoprolol succinate 12.5 mg Tb24 24 hr tablet  Commonly known as:  TOPROL-XL  Take 0.5 tablets (12.5 mg total) by mouth daily.     oxyCODONE 5 MG immediate release tablet  Commonly known as:  Oxy IR/ROXICODONE  Take 1-2 tablets (5-10 mg total) by mouth every 4 (four) hours as needed for moderate pain.     senna-docusate 8.6-50 MG per tablet  Commonly known as:  Senokot-S  Take 1 tablet by mouth 2 (two) times daily.           Follow-up Information   Follow up with Milford CageWoodruff, Forbes Loll Young, MD On 02/22/2014. (10:00 am)    Specialty:  Urology   Contact information:   [address] [facility]  [place] [address] [phone]       Call Donato SchultzSKAINS, MARK, MD. (Cardiology)    Specialty:  Cardiology   Contact information:   [address] [place] [address] [phone]  Signed: Milford CageDaniel Young Masey Scheiber 02/12/2014, 7:46 AM

## 2014-02-12 NOTE — Telephone Encounter (Signed)
Patient was admitted with kidney stone and went into PAF with RVR post op.  There was concern by Dr. Anne FuSkains that patient may have OSA.  Please [facility]

## 2014-02-12 NOTE — Discharge Instructions (Signed)
DISCHARGE INSTRUCTIONS FOR [facility]   MEDICATIONS:  1. Resume all your other meds from home.   ACTIVITY 1. No strenuous activity, sexual activity, or lifting greater than 10 pounds for 3 weeks. 2. No driving while on narcotic pain medications 3. Drink plenty of water 4. Continue to walk at home - you can still get blood clots when you are at home, so keep active, but don't over do it. 5. May return to work in 1 week (but not heavy or strenuous activity).   BATHING 1. You can shower and we recommend daily showers.  Cover your wound with a dressing and remove the dressing immediately after the shower.  Do not submerge wound under water.   WOUND CARE Your wound will drain bloody fluid and may do so for 7-14 days. You have 2 options for dressings:    1. You may use kerlex (rolled up gauze) and tape to dress your wound.  If you choose this method, then change the dressing as it becomes soaked.  Change it at least once daily until it stops draining.  2. You may use and ostomy device.  This is a bag with an andhesive circle.  The circle has a hole in the middle of it and you cut the hole to the size needed to fit the wound.  This will collect the drainage in the bag and allow you to drain the bag as needed.    SIGNS/SYMPTOMS TO CALL: 1. Please call us if you have a fever greater than 101.5, uncontrolled nausea/vomiting, uncontrolled pain, dizziness, unable to urinate, chest pain, shortness of breath, leg swelling, leg pain, redness around wound, drainage from wound, or any other concerns or questions. 2. You can reach us at [phone].

## 2014-02-12 NOTE — Progress Notes (Signed)
SUBJECTIVE:  No complaints  OBJECTIVE:   Vitals:   Filed Vitals:   02/11/14 1700 02/11/14 1841 02/11/14 2111 02/12/14 0453  BP: 106/69 138/65 130/70 120/86  Pulse: 70 74 75 61  Temp:  98.2 F (36.8 C) 98.1 F (36.7 C) 98.2 F (36.8 C)  TempSrc:  Oral Oral Oral  Resp: 19 20 18 18   Height:      Weight:      SpO2: 97% 98% 98% 97%   I&O's:   Intake/Output Summary (Last 24 hours) at 02/12/14 0731 Last data filed at 02/12/14 0503  Gross per 24 hour  Intake 1182.92 ml  Output   1930 ml  Net -747.08 ml   TELEMETRY: Reviewed telemetry pt in NSR:     PHYSICAL EXAM General: Well developed, well nourished, in no acute distress Head: Eyes PERRLA, No xanthomas.   Normal cephalic and atramatic  Lungs:   Clear bilaterally to auscultation and percussion. Heart:   HRRR S1 S2 Pulses are 2+ & equal. Abdomen: Bowel sounds are positive, abdomen soft and non-tender without masses Extremities:  No clubbing, cyanosis or edema.  DP +1 Neuro: Alert and oriented X 3. Psych:  Good affect, responds appropriately   LABS: Basic Metabolic Panel:  Recent Labs  13/08/65 0429 02/12/14 0435  NA 140 137  K 3.8 3.8  CL 102 99  CO2 26 26  GLUCOSE 140* 106*  BUN 6 10  CREATININE 0.82 0.87  CALCIUM 9.0 9.3   Liver Function Tests:  Recent Labs  02/10/14 1703  AST 13  ALT 17  ALKPHOS 58  BILITOT 0.5  PROT 6.7  ALBUMIN 3.7   No results found for this basename: LIPASE, AMYLASE,  in the last 72 hours CBC:  Recent Labs  02/11/14 0429 02/12/14 0435  HGB 13.5 13.6  HCT 39.9 39.7   Cardiac Enzymes:  Recent Labs  02/10/14 1703 02/10/14 2332 02/11/14 0429  TROPONINI <0.30 <0.30 <0.30   BNP: No components found with this basename: POCBNP,  D-Dimer: No results found for this basename: DDIMER,  in the last 72 hours Hemoglobin A1C: No results found for this basename: HGBA1C,  in the last 72 hours Fasting Lipid Panel: No results found for this basename: CHOL, HDL, LDLCALC,  TRIG, CHOLHDL, LDLDIRECT,  in the last 72 hours Thyroid Function Tests: No results found for this basename: TSH, T4TOTAL, FREET3, T3FREE, THYROIDAB,  in the last 72 hours Anemia Panel: No results found for this basename: VITAMINB12, FOLATE, FERRITIN, TIBC, IRON, RETICCTPCT,  in the last 72 hours Coag Panel:   Lab Results  Component Value Date   INR 1.00 02/08/2014    RADIOLOGY: Ct Abdomen Wo Contrast  02/11/2014   CLINICAL DATA:  Status post lithotripsy x2 4 right staghorn calculus, right nephrostomy in place  EXAM: CT ABDOMEN WITHOUT CONTRAST  TECHNIQUE: Multidetector CT imaging of the abdomen was performed following the standard protocol without IV contrast.  COMPARISON:  None.  FINDINGS: 10 mL contrast was injected via percutaneous nephrostomy catheter prior to the imaging. This opacifies portions of the right proximal collecting system and ureter, obscuring underlying calculi.  There are at least three possible underlying calculi within the right renal collecting system:  --possible 4 mm calculus in the medial right upper pole (series 2/image 34)  --possible 8 x 3 mm calculus in the posterior interpolar right kidney, just above the nephrostomy catheter (series 2/ image 40)  --possible 2 mm calculus in the lateral interpolar right kidney (series 2/ image 42), superolateral  to the nephrostomy catheter  Given contrast within the collecting system, none of these findings are definitive. Gas within the right renal collecting system is iatrogenic.  Mild dependent atelectasis in the bilateral lung bases.  Mild hepatic steatosis.  Unenhanced spleen, pancreas, and adrenal glands are within normal limits.  Gallbladder is unremarkable. No intrahepatic or extrahepatic ductal dilatation.  Left kidney is within normal limits.  No hydronephrosis.  Mild nonspecific perinephric/retroperitoneal stranding on the right, related to recent procedure.  No abdominal ascites.  No suspicious abdominal lymphadenopathy.   Degenerative changes of the visualized thoracolumbar spine.  IMPRESSION: Contrast injected via percutaneous nephrostomy catheter opacifies the right proximal collecting system/ureter, obscuring underlying calculi.  At least three possible underlying calculi within the right renal collecting system, the largest of which measures 8 x 3 mm.  These findings were discussed with Dr. Margarita Grizzle on 02/11/2014 at 0845 hours. The percutaneous nephrostomy catheter has been subsequently removed. An abdominal radiograph will be ordered to assess for residual calculi.   Electronically Signed   By: Charline Bills M.D.   On: 02/11/2014 08:59   Dg Abd 1 View  02/11/2014   CLINICAL DATA:  Evaluate for residual right-sided renal stones post percutaneous nephrolithotomy  EXAM: ABDOMEN - 1 VIEW  COMPARISON:  CT ABDOMEN W/O CM dated 02/10/2014; DG RETROGRADE PYELOGRAM dated 02/08/2014; CT UROGRAM dated 12/14/2013; DG ABDOMEN 1V dated 02/10/2014; IR [place] Abts CATH PERC *R* dated 02/08/2014  FINDINGS: No definitive abnormal opacities overlie the expected location of the right renal pelvis, the right-sided double-J ureteral stent or the urinary bladder. Note, evaluation somewhat degraded secondary to overlying colonic stool burden.  No definitive abnormal opacities overlie the expected location of the left renal fossa or left ureter.  No acute osseus abnormalities.  IMPRESSION: No definite evidence of residual nephrolithiasis.   Electronically Signed   By: Simonne Come M.D.   On: 02/11/2014 09:51   Dg Abd 1 View  02/10/2014   CLINICAL DATA:  Right renal stone removal and stent placement.  EXAM: ABDOMEN - 1 VIEW  COMPARISON:  DG RETROGRADE PYELOGRAM dated 02/08/2014  FINDINGS: Two fluoroscopic spot images demonstrate double-J right ureteral stent, with distal loop reconstituted in the urinary bladder. The proximal loop is not reconstituted and is situated by a large poor nephrostomy tube.  IMPRESSION: Right nephrostomy placement with right  ureteral stent.   Electronically Signed   By: Andreas Newport M.D.   On: 02/10/2014 16:49   Dg Retrograde Pyelogram  02/08/2014   CLINICAL DATA:  Right staghorn calculus.  EXAM: RETROGRADE PYELOGRAM  COMPARISON:  Percutaneous catheter placement 01/2014  FINDINGS: There is a right nephroureteral catheter that extends into the bladder. A left retrograde pyelogram was performed. The mid and distal aspects of the left ureter were opacified and appear to be normal. Partial opacification of the proximal left ureter and left renal pelvis. Incomplete opacification of the left renal calices. Left renal collecting system is decompressed.  IMPRESSION: Normal appearance of the mid and distal left ureter. Incomplete opacification of the proximal left ureter and left upper renal collecting system as described.   Electronically Signed   By: Richarda Overlie M.D.   On: 02/08/2014 16:18   Ir Nephrostogram Right  02/08/2014   CLINICAL DATA:  Right staghorn calculus  EXAM: ULTRASOUND GUIDANCE FOR NEPHROSTOMY ACCESS  RIGHT NEPHROSTOMY ACCESS PRIOR TO OPERATIVE NEPHROLITHOTOMY  ANTEGRADE NEPHROSTOGRAM  Date:  02/08/2014 10:23 AM4/20/2015 10:23 AM  Radiologist:  Judie Petit. Ruel Favors, MD  Guidance:  Ultrasound of  fluoroscopic  FLUOROSCOPY TIME:  2 min 24 seconds  MEDICATIONS AND MEDICAL HISTORY: Performed mg Cipro administered within 1 hr of the procedure, 4 mg Versed, 100 mcg fentanyl  ANESTHESIA/SEDATION: 15 min  CONTRAST:  10 cc Omnipaque 300  COMPLICATIONS: No immediate  PROCEDURE: Informed consent was obtained from the patient following explanation of the procedure, risks, benefits and alternatives. The patient understands, agrees and consents for the procedure. All questions were addressed. A time out was performed.  Maximal barrier sterile technique utilized including caps, mask, sterile gowns, sterile gloves, large sterile drape, hand hygiene, and ChloraPrep.  Previous imaging reviewed. This demonstrated a large right staghorn calculus  predominately in the lower pole.  Patient positioned prone. Preliminary ultrasound performed. Lower pole staghorn calculus was localized with ultrasound. Under sterile conditions and local anesthesia, an 18 gauge 15 cm access needle was advanced under direct ultrasound into the lower pole calyx containing the echogenic shadowing calculus. Needle position confirmed on the staghorn calculus with ultrasound and fluoroscopy. The calculus could be felt on the needle tip. There was return of urine from the needle. Guidewire advanced adjacent to the staghorn calculus. Accustick dilator set advanced. Through the outer dilator, antegrade nephrostogram performed.  Right nephrostogram: This demonstrates partial duplication of the right ureter to the level of the iliac crest where the ureter becomes a single ureter into the pelvis. No ureteral obstruction or stone.  Bentson guidewire inserted followed by a Kumpe catheter. Access was advanced into the bladder. Position confirmed in gallbladder with a contrast injection. Images obtained for documentation. Five French catheter remains as nephrostomy access prior to operative nephrolithotomy. This was secured externally with a Prolene suture. Sterile dressing applied. No immediate complication. Patient tolerated the procedure well.  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   On: 02/08/2014 11:00   Ir [place] Guide Bx Asp/drain  02/08/2014   CLINICAL DATA:  Right staghorn calculus  EXAM: ULTRASOUND GUIDANCE FOR NEPHROSTOMY ACCESS  RIGHT NEPHROSTOMY ACCESS PRIOR TO OPERATIVE NEPHROLITHOTOMY  ANTEGRADE NEPHROSTOGRAM  Date:  02/08/2014 10:23 AM4/20/2015 10:23 AM  Radiologist:  Judie PetitM. Ruel Favorsrevor Shick, MD  Guidance:  Ultrasound of fluoroscopic   FLUOROSCOPY TIME:  2 min 24 seconds  MEDICATIONS AND MEDICAL HISTORY: Performed mg Cipro administered within 1 hr of the procedure, 4 mg Versed, 100 mcg fentanyl  ANESTHESIA/SEDATION: 15 min  CONTRAST:  10 cc Omnipaque 300  COMPLICATIONS: No immediate  PROCEDURE: Informed consent was obtained from the patient following explanation of the procedure, risks, benefits and alternatives. The patient understands, agrees and consents for the procedure. All questions were addressed. A time out was performed.  Maximal barrier sterile technique utilized including caps, mask, sterile gowns, sterile gloves, large sterile drape, hand hygiene, and ChloraPrep.  Previous imaging reviewed. This demonstrated a large right staghorn calculus predominately in the lower pole.  Patient positioned prone. Preliminary ultrasound performed. Lower pole staghorn calculus was localized with ultrasound. Under sterile conditions and local anesthesia, an 18 gauge 15 cm access needle was advanced under direct ultrasound into the lower pole calyx containing the echogenic shadowing calculus. Needle position confirmed on the staghorn calculus with ultrasound and fluoroscopy. The calculus could be felt on the needle tip. There was return of urine from the needle. Guidewire advanced adjacent to the staghorn calculus. Accustick dilator set  advanced. Through the outer dilator, antegrade nephrostogram performed.  Right nephrostogram: This demonstrates partial duplication of the right ureter to the level of the iliac crest where the ureter becomes a single ureter into the pelvis. No ureteral obstruction or stone.  Bentson guidewire inserted followed by a Kumpe catheter. Access was advanced into the bladder. Position confirmed in gallbladder with a contrast injection. Images obtained for documentation. Five French catheter remains as nephrostomy access prior to operative nephrolithotomy. This was secured externally with a Prolene suture. Sterile dressing  applied. No immediate complication. Patient tolerated the procedure well.  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   On: 02/08/2014 11:00   Dg C-arm 61-120 Min-no Report  02/10/2014   CLINICAL DATA: right kidney stone   C-ARM 61-120 MINUTES  Fluoroscopy was utilized by the requesting physician.  No radiographic  interpretation.    Dg C-arm 61-120 Min-no Report  02/08/2014   CLINICAL DATA: kidney stone   C-ARM 61-120 MINUTES  Fluoroscopy was utilized by the requesting physician.  No radiographic  interpretation.    Ir [place] Abts Cath Perc Right  02/08/2014   CLINICAL DATA:  Right staghorn calculus  EXAM: ULTRASOUND GUIDANCE FOR NEPHROSTOMY ACCESS  RIGHT NEPHROSTOMY ACCESS PRIOR TO OPERATIVE NEPHROLITHOTOMY  ANTEGRADE NEPHROSTOGRAM  Date:  02/08/2014 10:23 AM4/20/2015 10:23 AM  Radiologist:  Judie Petit. Ruel Favors, MD  Guidance:  Ultrasound of fluoroscopic  FLUOROSCOPY TIME:  2 min 24 seconds  MEDICATIONS AND MEDICAL HISTORY: Performed mg Cipro administered within 1 hr of the procedure, 4 mg Versed, 100 mcg fentanyl  ANESTHESIA/SEDATION: 15 min  CONTRAST:  10 cc Omnipaque 300  COMPLICATIONS: No immediate  PROCEDURE: Informed consent was obtained from the patient following explanation of the procedure, risks, benefits and alternatives. The patient understands, agrees and consents for the procedure. All questions were addressed. A time out was performed.  Maximal barrier sterile technique utilized including caps, mask, sterile gowns, sterile gloves, large sterile drape, hand hygiene, and ChloraPrep.  Previous imaging reviewed. This demonstrated a large right staghorn calculus predominately in the lower pole.  Patient positioned prone. Preliminary ultrasound performed. Lower  pole staghorn calculus was localized with ultrasound. Under sterile conditions and local anesthesia, an 18 gauge 15 cm access needle was advanced under direct ultrasound into the lower pole calyx containing the echogenic shadowing calculus. Needle position confirmed on the staghorn calculus with ultrasound and fluoroscopy. The calculus could be felt on the needle tip. There was return of urine from the needle. Guidewire advanced adjacent to the staghorn calculus. Accustick dilator set advanced. Through the outer dilator, antegrade nephrostogram performed.  Right nephrostogram: This demonstrates partial duplication of the right ureter to the level of the iliac crest where the ureter becomes a single ureter into the pelvis. No ureteral obstruction or stone.  Bentson guidewire inserted followed by a Kumpe catheter. Access was advanced into the bladder. Position confirmed in gallbladder with a contrast injection. Images obtained for documentation. Five French catheter remains as nephrostomy access prior to operative nephrolithotomy. This was secured externally with a Prolene suture. Sterile dressing applied. No immediate complication. Patient tolerated the procedure well.  IMPRESSION: Successful ultrasound and fluoroscopic left nephrostomy access prior to operative nephrolithotomy  Large right staghorn calculus predominately in the lower to midpole  Partial duplication of the right  ureter without obstruction   Electronically Signed   By: Ruel Favorsrevor  Shick M.D.   On: 02/08/2014 11:00   ASSESSMENT/PLAN:  53 year old postoperative atrial fibrillation with rapid ventricular response after urologic procedure with comorbidities of obesity, hypertension.  1. Atrial fibrillation with rapid ventricular response-He converted to NSR after Lopressor 12.5mg  BID.  Now on Toprol 12.5mg  daily. 2D echo with low normal LVF, mildly dilated LA and mild LVH. 2.  Strongly consider sleep study as an outpatient - my office will set this  up 3.  No anticoagulation. CHADS-VASc = 1 (HTN) unless afib persists. 4.  Continue to encourage weight loss.   No further recs at this time.  OK for d/c from cardiac standpoint.  Please have him followup with Dr. Anne FuSkains as an outpt      Quintella Reichertraci R Turner, MD  02/12/2014  7:31 AM

## 2014-02-13 NOTE — Telephone Encounter (Signed)
Please let patient know that Dr. Anne FuSkains had recommended it in his note when he first consulted on him

## 2014-02-15 NOTE — Telephone Encounter (Signed)
Noted, called pt on 02/12/14 to let him know that [facility] time and date of sleep study.

## 2014-02-26 ENCOUNTER — Encounter: Payer: Self-pay | Admitting: Cardiology

## 2014-03-02 ENCOUNTER — Telehealth: Payer: Self-pay | Admitting: Cardiology

## 2014-03-02 NOTE — Telephone Encounter (Signed)
Please let patient know that he severe OSA and set up for CPAP titration

## 2014-03-03 NOTE — Telephone Encounter (Signed)
Please arrange this for this patient

## 2014-03-03 NOTE — Telephone Encounter (Signed)
To Danielle C.

## 2014-03-04 NOTE — Telephone Encounter (Signed)
It appears that this is being handled by the specialty office and was only sent to our office as a courtesy.

## 2014-03-05 NOTE — Telephone Encounter (Signed)
Pt is aware. Paper work filled out and put up front with check out to fax over to [facility].

## 2014-03-26 ENCOUNTER — Encounter: Payer: Self-pay | Admitting: Cardiology

## 2014-03-30 ENCOUNTER — Telehealth: Payer: Self-pay | Admitting: Cardiology

## 2014-03-30 DIAGNOSIS — G4733 Obstructive sleep apnea (adult) (pediatric): Secondary | ICD-10-CM

## 2014-03-30 NOTE — Addendum Note (Signed)
Addended byOrlene Plum H on: 03/30/2014 02:06 PM   Modules accepted: Orders

## 2014-03-30 NOTE — Telephone Encounter (Signed)
Order placed

## 2014-03-30 NOTE — Telephone Encounter (Signed)
Please let patient know that he has successful CPAP titration.  Please order CPAP with ResMed unit set at 13cm H2O with medium Resmed Airfit mask of choice, heated humidifier and C-flex of 3.  FOllowup with me in 10 weeks

## 2014-04-01 NOTE — Telephone Encounter (Signed)
Pt.notified

## 2014-05-03 ENCOUNTER — Encounter: Payer: Self-pay | Admitting: Cardiology

## 2014-06-18 ENCOUNTER — Ambulatory Visit: Payer: BC Managed Care – PPO | Admitting: Cardiology

## 2014-06-21 ENCOUNTER — Encounter: Payer: Self-pay | Admitting: General Surgery

## 2014-06-21 ENCOUNTER — Encounter: Payer: Self-pay | Admitting: Cardiology

## 2014-06-21 ENCOUNTER — Ambulatory Visit (INDEPENDENT_AMBULATORY_CARE_PROVIDER_SITE_OTHER): Payer: BC Managed Care – PPO | Admitting: Cardiology

## 2014-06-21 VITALS — BP 138/98 | HR 74 | Ht 73.0 in | Wt 296.0 lb

## 2014-06-21 DIAGNOSIS — I4819 Other persistent atrial fibrillation: Secondary | ICD-10-CM | POA: Insufficient documentation

## 2014-06-21 DIAGNOSIS — I4891 Unspecified atrial fibrillation: Secondary | ICD-10-CM

## 2014-06-21 DIAGNOSIS — I10 Essential (primary) hypertension: Secondary | ICD-10-CM

## 2014-06-21 DIAGNOSIS — I480 Paroxysmal atrial fibrillation: Secondary | ICD-10-CM

## 2014-06-21 DIAGNOSIS — G4733 Obstructive sleep apnea (adult) (pediatric): Secondary | ICD-10-CM

## 2014-06-21 DIAGNOSIS — Z9989 Dependence on other enabling machines and devices: Secondary | ICD-10-CM | POA: Insufficient documentation

## 2014-06-21 LAB — BASIC METABOLIC PANEL
BUN: 11 mg/dL (ref 6–23)
CO2: 26 mEq/L (ref 19–32)
Calcium: 9.3 mg/dL (ref 8.4–10.5)
Chloride: 103 mEq/L (ref 96–112)
Creatinine, Ser: 0.9 mg/dL (ref 0.4–1.5)
GFR: 98.04 mL/min (ref >60.00)
GLUCOSE: 130 mg/dL — AB (ref 70–99)
POTASSIUM: 3.8 meq/L (ref 3.5–5.1)
Sodium: 138 mEq/L (ref 135–145)

## 2014-06-21 MED ORDER — METOPROLOL SUCCINATE 12.5 MG HALF TABLET: 12.5000 mg | ORAL_TABLET | Freq: Every day | ORAL | Status: DC

## 2014-06-21 NOTE — Progress Notes (Signed)
[address] [place], [place]  [address] Phone: [phone] Fax:  [phone]  Date:  06/21/2014   ID:  Christopher Phillips, DOB 1961/10/05, MRN [id number]  PCP:  Rudi Heap, MD  Cardiologist:  Armanda Magic, MD     History of Present Illness: This is a 53yo male who was admitted 02/08/14 for a right sided perc nephrostolithotomy for renal stone and post op developed afib with RVR.  He was started on a Cardizem gtt and subsequently changed to metoprolol and converted to NSR.  2D echo showed low normal LVF EF 50-55% with mild BSH and mild LAE.  He is now here for followup.  He was discharged on potassium citrate and allopurinol and then started having palpitations for up to 8 hours at a time.  He stopped taking both and he has not had any further palpitations.  There was also some concern that he may have OSA and was set up for outpt sleep study.  He underwent PSG showing severe OSA and was set up for CPAP titration.  He underwent successful CPAP titration and was set on CPAP at 13cm H2O.  He now presents for followup.  He uses a nasal pillow mask which he tolerates well and tolerates the pressure.  He feels rested in the am and has no daytime sleepiness.  He says he feels better than he has felt in years.  He has no nasal congestion.  He walks 6 miles at work but it is intermittent but he plans to start walking at home.   Wt Readings from Last 3 Encounters:  06/21/14 296 lb (134.265 kg)  02/08/14 281 lb (127.461 kg)  02/08/14 281 lb (127.461 kg)     Past Medical History  Diagnosis Date  . Hypertension   . Hyperlipidemia   . Complication of anesthesia     slow to wake up  . History of kidney stones   . OSA on CPAP     severe with AHI 24.84/hr now on CPAP at 13cm H2O  . PAF (paroxysmal atrial fibrillation)     CHADS2VASC is 1 (HTN)    Current Outpatient Prescriptions  Medication Sig Dispense Refill  . aspirin 81 MG chewable tablet Chew 81 mg by mouth daily.      . Flaxseed,  Linseed, (FLAXSEED OIL) 1200 MG CAPS Take 1,200 mg by mouth 2 (two) times daily.      [name] lisinopril-hydrochlorothiazide (PRINZIDE,ZESTORETIC) 20-25 MG per tablet Take 1 tablet by mouth every morning.      . metoprolol succinate (TOPROL-XL) 12.5 mg TB24 24 hr tablet Take 0.5 tablets (12.5 mg total) by mouth daily.  15 tablet  6  . Omega-3 Fatty Acids (OMEGA-3 FISH OIL) 1200 MG CAPS Take by mouth 2 (two) times daily.      . Red Yeast Rice 600 MG CAPS Take 600 mg by mouth 2 (two) times daily.      . Turmeric 500 MG CAPS Take 500 mg by mouth 2 (two) times daily.      [name] allopurinol (ZYLOPRIM) 300 MG tablet Take 300 mg by mouth daily.      . bisacodyl (DULCOLAX) 10 MG suppository Place 1 suppository (10 mg total) rectally 2 (two) times daily.  12 suppository  0  . oxyCODONE (OXY IR/ROXICODONE) 5 MG immediate release tablet Take 1-2 tablets (5-10 mg total) by mouth every 4 (four) hours as needed for moderate pain.  50 tablet  0  . senna-docusate (SENOKOT-S) 8.6-50 MG per  tablet Take 1 tablet by mouth 2 (two) times daily.  60 tablet  0   No current facility-administered medications for this visit.    Allergies:   No Known Allergies  Social History:  The patient  reports that he quit smoking about 21 years ago. He does not have any smokeless tobacco history on file. He reports that he does not drink alcohol or use illicit drugs.   Family History:  The patient's family history includes Heart failure (age of onset: 19) in his father and mother.   ROS:  Please see the history of present illness.      All other systems reviewed and negative.   PHYSICAL EXAM: VS:  BP 138/98  Pulse 74  Ht  (1.854 m)  Wt 296 lb (134.265 kg)  BMI 39.06 kg/m2 Well nourished, well developed, in no acute distress HEENT: large tongue and small posterior oropharynx Neck: no JVD Cardiac:  normal S1, S2; RRR; no murmur Lungs:  clear to auscultation bilaterally, no wheezing, rhonchi or rales Abd: soft, nontender, no  hepatomegaly Ext: no edema Skin: warm and dry Neuro:  CNs 2-12 intact, no focal abnormalities noted      ASSESSMENT AND PLAN:  1. Severe OSA now on CPAP at 13cm H2O and tolerating well.  His most recent d/l showed an AHI of 0.6/hr on 13cm H2O and 93% compliance in using more than 4 hours nightly. He has had significant improvement in daytime sleepiness and feeling of well being after starting CPAP.  He will continue on current meds. 2. PAF maintaining NSR - CHADS2VASC is 1 (HTN) so no systemic anticoagulation indicated.  Continue ASA 3. HTN elevated here but at home it runs 120/60-32mmHg.  Continue metoprolol and Prinizide - check BMET  Followup with me in 6 months  Signed, Armanda Magic, MD 06/21/2014 9:29 AM

## 2014-06-21 NOTE — Patient Instructions (Signed)
Your physician recommends that you continue on your current medications as directed. Please refer to the Current Medication list given to you today.  Your physician recommends that you go to the lab today for a BMET  Your physician wants you to follow-up in: 6 months with Dr Turner You will receive a reminder letter in the mail two months in advance. If you don't receive a letter, please call our office to schedule the follow-up appointment.  

## 2014-07-07 ENCOUNTER — Encounter: Payer: Self-pay | Admitting: Cardiology

## 2014-07-16 ENCOUNTER — Encounter: Payer: Self-pay | Admitting: General Surgery

## 2014-09-10 ENCOUNTER — Ambulatory Visit: Payer: BC Managed Care – PPO | Admitting: Podiatry

## 2014-09-14 ENCOUNTER — Telehealth: Payer: Self-pay | Admitting: *Deleted

## 2014-09-14 NOTE — Telephone Encounter (Signed)
New Message  Per pt wife- Pt taking metroprolol half daily; wondering if that is enough; fatigue while working, see heart beating through shirt, (130-140 pulse); please call back to discuss.

## 2014-09-14 NOTE — Telephone Encounter (Signed)
Instructed patient to check his BP and HR daily for a week and call back with results.  Patient agrees with treatment plan.

## 2014-09-14 NOTE — Telephone Encounter (Signed)
Please have patient check his BP and HR daily for a week and call.  If these are ok I may consider adding Cardizem to suppress PAF

## 2014-09-14 NOTE — Telephone Encounter (Signed)
Pt st that his wife exaggerated and he is "feeling just about as good as he ever has." He st that he has intermittent Afib but it does seem like it's becoming more frequent. When in normal sinus, patient st his HR is between 60-70. When fib starts, he knows because his HR skyrockets to 120-130.  Pt st it eventually settles to about 80-90 and then 60 again when back in normal sinus rhythm. Patient inquiring if he needs to increase his Lopressor.  To Dr. Mayford Knifeurner for review and recommendations.

## 2014-09-24 ENCOUNTER — Encounter: Payer: Self-pay | Admitting: Podiatry

## 2014-09-24 ENCOUNTER — Ambulatory Visit (INDEPENDENT_AMBULATORY_CARE_PROVIDER_SITE_OTHER): Payer: BC Managed Care – PPO | Admitting: Podiatry

## 2014-09-24 ENCOUNTER — Telehealth: Payer: Self-pay | Admitting: Cardiology

## 2014-09-24 VITALS — BP 151/83 | HR 71 | Ht 73.0 in | Wt 304.0 lb

## 2014-09-24 DIAGNOSIS — L600 Ingrowing nail: Secondary | ICD-10-CM | POA: Insufficient documentation

## 2014-09-24 DIAGNOSIS — M79674 Pain in right toe(s): Secondary | ICD-10-CM

## 2014-09-24 DIAGNOSIS — L608 Other nail disorders: Secondary | ICD-10-CM

## 2014-09-24 DIAGNOSIS — M79676 Pain in unspecified toe(s): Secondary | ICD-10-CM | POA: Insufficient documentation

## 2014-09-24 MED ORDER — METOPROLOL SUCCINATE ER 25 MG PO TB24: 25.0000 mg | ORAL_TABLET | Freq: Every day | ORAL | Status: DC

## 2014-09-24 NOTE — Telephone Encounter (Signed)
Called patient and informed him to increase his Metoprolol Succinate to 25 mg from 12.5 mg daily, per Dr. Mayford Knifeurner. Sent this to his pharmacy of choice a prescription of new dose. Patient was also asked to check his blood pressure and heart rate daily for one week and call Dr. Norris Crossurner's office with results. Patient verbalized understanding. Patient had no other questions at this time.  Cindi CarbonPamela Pate RN

## 2014-09-24 NOTE — Telephone Encounter (Signed)
Increase Toprol to 25mg  daily to try to suppress PAF.  Have patient check BP and HR daily for 1 week and call with results

## 2014-09-24 NOTE — Telephone Encounter (Signed)
Will forward to Dr.Turner 

## 2014-09-24 NOTE — Patient Instructions (Signed)
Right great toe nail both border surgery done. Follow soaking instruction and return in one week.

## 2014-09-24 NOTE — Progress Notes (Signed)
Subjective: 53 year old male presents accompanied by his daughter complaining of having ingrown nail on right great toe all the time. Patient wants the nail fixed.  Objective: Neurovascular status are within normal. Dorsal bunion both feet. Thick deformed nail ingrown on both border right foot symptomatic.  Assessment: Chronic painful ingrown nail right great toe both border. Deformed nail with onychomycosis right hallux.  Plan: Phenol and Alcohol Matrixectomy done on right great toe on bothChDelorise ShKandis [address] ChDelorise ShKandis [address] RoaKoreadF7[facility] DbChDelorise ShKandis [address]F8[place] EnChDelorise ShKandis [address] LittleChDelorise ShKandis 8706 SierrChDelorise ShKandis [address] DrivKorChDelorise ShKandis 364 ChDelorise ShKandis [address] StreeKoreatF7ChDelorise ShKandis [address]Korea.F7StrategicChDelorise ShKandis [address] CirclKoreaeF2ChiChDelorise ShKandis [address] Maple StKorea.ChDelorise ShKandis 52 PearlChDelorChDelorise ShKandis [address] RoaKoreadF5The RehabilitaChDelorise ShKandis [address] StKorChDelorise ShKandis [address] StKorea.F7CaribChDelorise ShKandis [address] LanKoreaeChDelorise ShKandis [address] RoaKoreadF1Franciscan SurgeChDelorise ShKandis [address] CourKoreatF2[facility] HRachelle H31ShareneGleKandis [address] StKoChDelorise ShKandis [address] Ma[address] StKorea.F8[facility]orial ChDelorise ShKandis [address] StKoChDelorise ShKandisChDelorise ShKandis 9775 CorChDelorise ShKandis [address] LanKoChDelorise ShKandis 2 GonzalesChDelorise ShKandis [address] AveKorea.F3Ambulatory Surgical CentChDelorise ShKandis 9311 CatheChDelorise ShKandis [address] AveKorea.F9[facility] HospChDelorise ShKandis 74 BaChDelorise ShKandis 3ChDelorise ShKandis [address] DrKorea.F1ChDelorise ShKandis [address] AveKorea.F2[facility] CentralRachelle H10Sharene Skeanseya [facility] SouthRachelle H50Sharene S70mJomCuCumberland MemoriaGlee cted with a nail elevator and excised with nail nipper. Proximal nail matrix tissue was cauterized with Phenol soaked cotton applicator x 4 and neutralized with Alcohol soaked cotton applicator. The wound was dressed with Amerigel ointment dressing. Home care instructions and supply dispensed.  Return in 1 week for follow up.

## 2014-09-24 NOTE — Telephone Encounter (Signed)
New message      Bp readings Nov 27th 126/71 pulse 66; 28th 127/71 pulse 67; 29th 116/68 pulse 126 pt in afib this day; 30th 129/81 pulse 77; 1st 127/76 pulse 63; 2nd 126/80 pulse 72; 3rd 128/83 pulse 66; 4th 125/73 pulse 71

## 2014-10-01 ENCOUNTER — Ambulatory Visit (INDEPENDENT_AMBULATORY_CARE_PROVIDER_SITE_OTHER): Payer: BC Managed Care – PPO | Admitting: Podiatry

## 2014-10-01 ENCOUNTER — Encounter: Payer: Self-pay | Admitting: Podiatry

## 2014-10-01 ENCOUNTER — Other Ambulatory Visit: Payer: Self-pay | Admitting: *Deleted

## 2014-10-01 DIAGNOSIS — L600 Ingrowing nail: Secondary | ICD-10-CM

## 2014-10-01 MED ORDER — METOPROLOL SUCCINATE ER 25 MG PO TB24: 25.0000 mg | ORAL_TABLET | Freq: Every day | ORAL | Status: DC

## 2014-10-01 NOTE — Progress Notes (Signed)
Post op check. Healing good without complication. Return as needed. 

## 2014-10-01 NOTE — Patient Instructions (Signed)
Post op check. Healing good without complication. Return as needed.

## 2014-10-06 ENCOUNTER — Telehealth: Payer: Self-pay | Admitting: Cardiology

## 2014-10-06 NOTE — Telephone Encounter (Signed)
To Dr. Turner for review and recommendations. 

## 2014-10-06 NOTE — Telephone Encounter (Signed)
New message     Bp readings Dec 5th 145/90 HR 138; 6th 128/80 HR 71; 7th 130/80 HR 72; 8th 126/68 HR 65; 9th 122/71 HR 74; 10th 118/67 HR 67; 11th 128/75 HR 64; 12th 128/70 HR 63; 13th 133/68 HR 59; 14th 123/67 HR 62; 15th 131/71 HR 65

## 2014-10-07 NOTE — Telephone Encounter (Signed)
Left message on private cell phone and advised that BP and HR are good and to continue current medical therapy per Dr.Turner.

## 2014-10-07 NOTE — Telephone Encounter (Signed)
BP and HR are good - continue current medical therapy

## 2014-10-12 ENCOUNTER — Encounter: Payer: Self-pay | Admitting: Cardiology

## 2014-12-17 ENCOUNTER — Ambulatory Visit (INDEPENDENT_AMBULATORY_CARE_PROVIDER_SITE_OTHER): Payer: BLUE CROSS/BLUE SHIELD | Admitting: Cardiology

## 2014-12-17 ENCOUNTER — Encounter: Payer: Self-pay | Admitting: Cardiology

## 2014-12-17 VITALS — BP 132/78 | HR 80 | Ht 73.0 in | Wt 296.1 lb

## 2014-12-17 DIAGNOSIS — Z9989 Dependence on other enabling machines and devices: Secondary | ICD-10-CM

## 2014-12-17 DIAGNOSIS — G4733 Obstructive sleep apnea (adult) (pediatric): Secondary | ICD-10-CM

## 2014-12-17 DIAGNOSIS — R002 Palpitations: Secondary | ICD-10-CM

## 2014-12-17 DIAGNOSIS — I10 Essential (primary) hypertension: Secondary | ICD-10-CM

## 2014-12-17 DIAGNOSIS — I480 Paroxysmal atrial fibrillation: Secondary | ICD-10-CM

## 2014-12-17 LAB — BASIC METABOLIC PANEL
BUN: 11 mg/dL (ref 6–23)
CO2: 25 mEq/L (ref 19–32)
Calcium: 9.4 mg/dL (ref 8.4–10.5)
Chloride: 104 mEq/L (ref 96–112)
Creat: 0.86 mg/dL (ref 0.50–1.35)
GLUCOSE: 92 mg/dL (ref 70–99)
Potassium: 3.8 mEq/L (ref 3.5–5.3)
SODIUM: 140 meq/L (ref 135–145)

## 2014-12-17 NOTE — Patient Instructions (Signed)
Your physician has recommended that you wear an event monitor. Event monitors are medical devices that record the heart's electrical activity. Doctors most often us these monitors to diagnose arrhythmias. Arrhythmias are problems with the speed or rhythm of the heartbeat. The monitor is a small, portable device. You can wear one while you do your normal daily activities. This is usually used to diagnose what is causing palpitations/syncope (passing out).  Your physician recommends that you have lab work TODAY (BMET)  Your physician wants you to follow-up in: 6 months with Dr. Mayford Knifeurner. You will receive a reminder letter in the mail two months in advance. If you don't receive a letter, please call our office to schedule the follow-up appointment.

## 2014-12-17 NOTE — Progress Notes (Signed)
Cardiology Office Note   Date:  12/17/2014   ID:  Christopher Phillips, DOB 1961/07/26, MRN [id number]  PCP:  No primary care provider on file.  Cardiologist:   Quintella Reichert, MD   Chief Complaint  Patient presents with  . Sleep Apnea  . Hypertension  . Atrial Fibrillation      History of Present Illness: This is a 54yo male with history fo PAF, low normal LVF EF 50-55% and severe OSA on CPAP.  He now presents for followup. He uses a nasal pillow mask which he tolerates well and tolerates the pressure. He says that he wakes up every hour to look at the clock and then goes back to sleep.  He feels rested in the am and has no daytime sleepiness.He has some mild nasal congestion. He has stopped walking due to the cold weather but is going to start a diet and get back into exercise.  He says that he has been having some palpitations off and on and feels his heart flip flop and HR will increase to 116bpm.  It is very erratic when it occurs.     Past Medical History  Diagnosis Date  . Hypertension   . Hyperlipidemia   . Complication of anesthesia     slow to wake up  . History of kidney stones   . OSA on CPAP     severe with AHI 24.84/hr now on CPAP at 13cm H2O  . PAF (paroxysmal atrial fibrillation)     CHADS2VASC is 1 (HTN)    Past Surgical History  Procedure Laterality Date  . Knee surgery      BILATERAL  . Kidney stone surgery      20 YRS AGO  . Nephrolithotomy Right 02/08/2014    Procedure: NEPHROLITHOTOMY PERCUTANEOUS;  Surgeon: Milford Cage, MD;  Location: [facility];  Service: Urology;  Laterality: Right;  1ST STAGE (RT) PCNL      . Cystoscopy with ureteroscopy Right 02/08/2014    Procedure: Cystoscopy Left retrograde pyleogram, right antegrade pyleogram;  Surgeon: Milford Cage, MD;  Location: [facility];  Service: Urology;  Laterality: Right;  . Nephrolithotomy Right 02/10/2014    Procedure: RIGHT NEPHROLITHOTOMY PERCUTANEOUS SECOND LOOK;  Surgeon: Milford Cage, MD;  Location: [facility];  Service: Urology;  Laterality: Right;  2ND STAGE (RT) PCNL      Current Outpatient Prescriptions  Medication Sig Dispense Refill  . allopurinol (ZYLOPRIM) 300 MG tablet Take 300 mg by mouth daily.    [name] aspirin 81 MG chewable tablet Chew 81 mg by mouth daily.    . Flaxseed, Linseed, (FLAXSEED OIL) 1200 MG CAPS Take 1,200 mg by mouth 2 (two) times daily.    [name] lisinopril-hydrochlorothiazide (PRINZIDE,ZESTORETIC) 20-25 MG per tablet Take 1 tablet by mouth every morning.    . metoprolol succinate (TOPROL-XL) 25 MG 24 hr tablet Take 1 tablet (25 mg total) by mouth daily. 90 tablet 1  . Omega-3 Fatty Acids (OMEGA-3 FISH OIL) 1200 MG CAPS Take by mouth 2 (two) times daily.    . Red Yeast Rice 600 MG CAPS Take 600 mg by mouth 2 (two) times daily.     No current facility-administered medications for this visit.    Allergies:   Review of patient's allergies indicates no known allergies.    Social History:  The patient  reports that he quit smoking about 21 years ago. He has never used smokeless tobacco. He reports that he does not drink alcohol or  use illicit drugs.   Family History:  The patient's family history includes Heart failure (age of onset: 2270) in his father and mother.    ROS:  Please see the history of present illness.   Otherwise, review of systems are positive for none.   All other systems are reviewed and negative.    PHYSICAL EXAM: VS:  BP 132/78 mmHg  Pulse 80  Ht 6\' 1"  (1.854 m)  Wt 296 lb 1.9 oz (134.319 kg)  BMI 39.08 kg/m2  SpO2 98% , BMI Body mass index is 39.08 kg/(m^2). GEN: Well nourished, well developed, in no acute distress HEENT: normal Neck: no JVD, carotid bruits, or masses Cardiac: RRR; no murmurs, rubs, or gallops,no edema  Respiratory:  clear to auscultation bilaterally, normal work of breathing GI: soft, nontender, nondistended, + BS MS: no deformity or atrophy Skin: warm and dry, no rash Neuro:  Strength and  sensation are intact Psych: euthymic mood, full affect   EKG:  EKG is not ordered today.    Recent Labs: 02/08/2014: Platelets 166 02/10/2014: ALT 17 02/12/2014: Hemoglobin 13.6 06/21/2014: BUN 11; Creatinine 0.9; Potassium 3.8; Sodium 138    Lipid Panel No results found for: CHOL, TRIG, HDL, CHOLHDL, VLDL, LDLCALC, LDLDIRECT    Wt Readings from Last 3 Encounters:  12/17/14 296 lb 1.9 oz (134.319 kg)  09/24/14 304 lb (137.893 kg)  06/21/14 296 lb (134.265 kg)        ASSESSMENT AND PLAN:  1. Severe OSA now on CPAP at 13cm H2O and tolerating well. His most recent d/l showed an AHI of 0.4/hr on 13cm H2O and 100% compliance in using more than 4 hours nightly. He has had significant improvement in daytime sleepiness and feeling of well being after starting CPAP. He will continue on current meds. 2. PAF maintaining NSR - CHADS2VASC is 1 (HTN) so no systemic anticoagulation indicated. Continue ASA.  He has been having a lot more palpitations so I will place an event monitor to assess further.  He does not drink any caffeine.   3. HTN - controlled.  Continue metoprolol and Prinizide - check BMET   Current medicines are reviewed at length with the patient today.  The patient does not have concerns regarding medicines.  The following changes have been made:  no change  Labs/ tests ordered today include: BMET     Disposition:   FU with me in 6 months   Signed, Quintella ReichertURNER,TRACI R, MD  12/17/2014 4:00 PM    [facility] [address], [place], [place]  [address] Phone: [phone]) [phone]; Fax: [phone]

## 2014-12-20 ENCOUNTER — Encounter: Payer: Self-pay | Admitting: *Deleted

## 2014-12-20 ENCOUNTER — Encounter (INDEPENDENT_AMBULATORY_CARE_PROVIDER_SITE_OTHER): Payer: BLUE CROSS/BLUE SHIELD

## 2014-12-20 DIAGNOSIS — R002 Palpitations: Secondary | ICD-10-CM | POA: Diagnosis not present

## 2014-12-20 NOTE — Progress Notes (Signed)
Patient ID: Christopher ChardBarry Phillips, male   DOB: 03/16/1961, 54 y.o.   MRN: [id number] [employer] 30 day cardiac event monitor applied to patient.

## 2014-12-27 ENCOUNTER — Telehealth: Payer: Self-pay | Admitting: Physician Assistant

## 2014-12-27 NOTE — Telephone Encounter (Signed)
     I received a call from [employer] afterhours. They called to report that Mr Christopher Phillips converted from NSR--> afib with RVR around 5 pm. Peak HR 192. Average 150 bpm. I called the patient who reported that he was aware of the episode and had some chest discomfort/diaphoresis. It lasted about 12 minutes and now it is completely resolved. His last HR was in 80s and he feels quite well. I will forward this to Dr. Mayford Knifeurner for further instructions.    Cline CrockKathryn Meggie Laseter PA-C  [facility]

## 2014-12-28 ENCOUNTER — Telehealth: Payer: Self-pay

## 2014-12-28 DIAGNOSIS — R079 Chest pain, unspecified: Secondary | ICD-10-CM

## 2014-12-28 MED ORDER — METOPROLOL SUCCINATE ER 50 MG PO TB24
50.0000 mg | ORAL_TABLET | Freq: Every day | ORAL | Status: DC
Start: 2014-12-28 — End: 2015-07-08

## 2014-12-28 NOTE — Telephone Encounter (Signed)
Left message for patient to call back. Patient called back. Informed patient, per Dr. Mayford Knifeurner and Carlean JewsKatie Thompson PA notes, he needs to increase Toprol to 50 mg and have a Lexiscan myoview to rule out ischemia. Patent's new dose of Toprol sent to pharmacy of patient's choice. Lexiscan ordered. Patient is to continue with heart monitor. Patient is to see Dr. Mayford Knifeurner next available on schedule. Informed [facility] that [facility] these two appointments. Patient verbalized understanding. Will forward to [facility].

## 2015-01-06 ENCOUNTER — Ambulatory Visit (HOSPITAL_COMMUNITY): Payer: BLUE CROSS/BLUE SHIELD | Attending: Internal Medicine | Admitting: Radiology

## 2015-01-06 DIAGNOSIS — R079 Chest pain, unspecified: Secondary | ICD-10-CM | POA: Diagnosis not present

## 2015-01-06 MED ORDER — REGADENOSON 0.4 MG/5ML IV SOLN
0.4000 mg | Freq: Once | INTRAVENOUS | Status: AC
  Administered 2015-01-06: 0.4 mg via INTRAVENOUS

## 2015-01-06 MED ORDER — TECHNETIUM TC 99M SESTAMIBI GENERIC - CARDIOLITE
11.0000 | Freq: Once | INTRAVENOUS | Status: AC | PRN
  Administered 2015-01-06: 11 via INTRAVENOUS

## 2015-01-06 MED ORDER — TECHNETIUM TC 99M SESTAMIBI GENERIC - CARDIOLITE
33.0000 | Freq: Once | INTRAVENOUS | Status: AC | PRN
  Administered 2015-01-06: 33 via INTRAVENOUS

## 2015-01-06 NOTE — Progress Notes (Signed)
[facility] SITE 3 NUCLEAR MED [address] [place], [place] [address] [phone]    Cardiology Nuclear Med Study  Reita ChardBarry Morris is a 54 y.o. male     MRN : [id number]     DOB: 02/01/1961  Procedure Date: 01/06/2015  Nuclear Med Background Indication for Stress Test:  Evaluation for Ischemia History:  MPI: 8 yrs ago AFIB RVR Cardiac Risk Factors: Hypertension  Symptoms:  Palpitations and SOB   Nuclear Pre-Procedure Caffeine/Decaff Intake:  None NPO After: 7:00pm   Lungs:  clear O2 Sat: 96% on room air. IV 0.9% NS with Angio Cath:  22g  IV Site: R Hand  IV Started by:  Bonnita LevanJackie Smith, RN  Chest Size (in):  50+ Cup Size: n/a  Height: 6\' 1"  (1.854 m)  Weight:  288 lb (130.636 kg)  BMI:  Body mass index is 38.01 kg/(m^2). Tech Comments:  N/A    Nuclear Med Study 1 or 2 day study: 1 day  Stress Test Type:  Lexiscan  Reading MD: N/A  Order Authorizing Provider:  Armanda Magicraci Turner, MD   Resting Radionuclide: Technetium 4517m Sestamibi  Resting Radionuclide Dose: 11.0 mCi   Stress Radionuclide:  Technetium 5417m Sestamibi  Stress Radionuclide Dose: 33.0 mCi           Stress Protocol Rest HR: 59 Stress HR: 83  Rest BP: 117/67 Stress BP: 116/79  Exercise Time (min): n/a METS: n/a   Predicted Max HR: 165 bpm % Max HR: 50.3 bpm Rate Pressure Product: 9711   Dose of Adenosine (mg):  n/a Dose of Lexiscan: 0.4 mg  Dose of Atropine (mg): n/a Dose of Dobutamine: n/a mcg/kg/min (at max HR)  Stress Test Technologist: Milana NaSabrina Williams, EMT-P  Nuclear Technologist:  Kerby NoraElzbieta Kubak, CNMT     Rest Procedure:  Myocardial perfusion imaging was performed at rest 45 minutes following the intravenous administration of Technetium 6117m Sestamibi. Rest ECG: Sinus bradycardia, septal MI.  Stress Procedure:  The patient received IV Lexiscan 0.4 mg over 15-seconds.  Technetium 3917m Sestamibi injected at 30-seconds. This patient had sob and chest tightness with the Lexiscan injection.  Quantitative spect images were obtained after a 45 minute delay. Stress ECG: No significant ST segment change suggestive of ischemia.  QPS Raw Data Images:  Acquisition technically good; LVE. Stress Images:  There is decreased uptake in the apex. Rest Images:  There is decreased uptake in the apex. Subtraction (SDS):  No evidence of ischemia. Transient Ischemic Dilatation (Normal <1.22):  1.14 Lung/Heart Ratio (Normal <0.45):  0.29  Quantitative Gated Spect Images QGS EDV:  181 ml QGS ESV:  93 ml  Impression Exercise Capacity:  Lexiscan with no exercise. BP Response:  Normal blood pressure response. Clinical Symptoms:  There is dyspnea and chest tightness. ECG Impression:  No significant ST segment change suggestive of ischemia. Comparison with Prior Nuclear Study: No images to compare  Overall Impression:  Low risk stress nuclear study with a small, mild, fixed apical defect consistent with thinning; no ischemia.  LV Ejection Fraction: 48%.  LV Wall Motion:  Mild global hyokinesis.  Olga MillersBrian Crenshaw

## 2015-01-11 NOTE — Progress Notes (Signed)
Cardiology Office Note   Date:  01/12/2015   ID:  Christopher Phillips, DOB 1961/08/25, MRN [id number]  PCP:  No primary care provider on file.  Cardiologist:   Quintella Reichert, MD   Chief Complaint  Patient presents with  . Sleep Apnea  . Hypertension  . Atrial Fibrillation      History of Present Illness: This is a 54yo male with history fo PAF, low normal LVF EF 50-55% and severe OSA on CPAP. He now presents for followup. He uses a nasal pillow mask which he tolerates well and tolerates the pressure. He says that he wakes up every hour to look at the clock and then goes back to sleep. He feels rested in the am and has no daytime sleepiness.When I last saw him he was complaining of palpitations and he wore a heart monitor showing some PAF with RVR and his BB was increased.  He also had chest discomfort during the episodes and stress test showed no ischemia.  He denies any chest pain, SOB, DOE, dizziness,  or syncope.  Since increasing the metoprolol he has not had any further palpitations.   He has started back walking 3-5 miles 3 times weekly.   Past Medical History  Diagnosis Date  . Hypertension   . Hyperlipidemia   . Complication of anesthesia     slow to wake up  . History of kidney stones   . OSA on CPAP     severe with AHI 24.84/hr now on CPAP at 13cm H2O  . PAF (paroxysmal atrial fibrillation)     CHADS2VASC is 1 (HTN)    Past Surgical History  Procedure Laterality Date  . Knee surgery      BILATERAL  . Kidney stone surgery      20 YRS AGO  . Nephrolithotomy Right 02/08/2014    Procedure: NEPHROLITHOTOMY PERCUTANEOUS;  Surgeon: Milford Cage, MD;  Location: [facility];  Service: Urology;  Laterality: Right;  1ST STAGE (RT) PCNL      . Cystoscopy with ureteroscopy Right 02/08/2014    Procedure: Cystoscopy Left retrograde pyleogram, right antegrade pyleogram;  Surgeon: Milford Cage, MD;  Location: [facility];  Service: Urology;  Laterality: Right;  .  Nephrolithotomy Right 02/10/2014    Procedure: RIGHT NEPHROLITHOTOMY PERCUTANEOUS SECOND LOOK;  Surgeon: Milford Cage, MD;  Location: [facility];  Service: Urology;  Laterality: Right;  2ND STAGE (RT) PCNL      Current Outpatient Prescriptions  Medication Sig Dispense Refill  . allopurinol (ZYLOPRIM) 300 MG tablet Take 300 mg by mouth daily.    [name] aspirin 81 MG chewable tablet Chew 81 mg by mouth daily.    . Flaxseed, Linseed, (FLAXSEED OIL) 1200 MG CAPS Take 1,200 mg by mouth 2 (two) times daily.    [name] lisinopril-hydrochlorothiazide (PRINZIDE,ZESTORETIC) 20-25 MG per tablet Take 1 tablet by mouth every morning.    . metoprolol succinate (TOPROL-XL) 50 MG 24 hr tablet Take 1 tablet (50 mg total) by mouth daily. 90 tablet 3  . Omega-3 Fatty Acids (OMEGA-3 FISH OIL) 1200 MG CAPS Take by mouth 2 (two) times daily.    . Red Yeast Rice 600 MG CAPS Take 600 mg by mouth 2 (two) times daily.     No current facility-administered medications for this visit.    Allergies:   Review of patient's allergies indicates no known allergies.    Social History:  The patient  reports that he quit smoking about 21 years ago. He has  never used smokeless tobacco. He reports that he does not drink alcohol or use illicit drugs.   Family History:  The patient's family history includes Heart failure (age of onset: 8670) in his father and mother.    ROS:  Please see the history of present illness.   Otherwise, review of systems are positive for none.   All other systems are reviewed and negative.    PHYSICAL EXAM: VS:  BP 120/78 mmHg  Pulse 60  Ht 6\' 1"  (1.854 m)  Wt 294 lb 12.8 oz (133.72 kg)  BMI 38.90 kg/m2 , BMI Body mass index is 38.9 kg/(m^2). GEN: Well nourished, well developed, in no acute distress HEENT: normal Neck: no JVD, carotid bruits, or masses Cardiac: RRR; no murmurs, rubs, or gallops,no edema  Respiratory:  clear to auscultation bilaterally, normal work of breathing GI: soft, nontender,  nondistended, + BS MS: no deformity or atrophy Skin: warm and dry, no rash Neuro:  Strength and sensation are intact Psych: euthymic mood, full affect   EKG:  EKG is not ordered today.    Recent Labs: 02/08/2014: Platelets 166 02/10/2014: ALT 17 02/12/2014: Hemoglobin 13.6 12/17/2014: BUN 11; Creatinine 0.86; Potassium 3.8; Sodium 140    Lipid Panel No results found for: CHOL, TRIG, HDL, CHOLHDL, VLDL, LDLCALC, LDLDIRECT    Wt Readings from Last 3 Encounters:  01/12/15 294 lb 12.8 oz (133.72 kg)  01/06/15 288 lb (130.636 kg)  12/17/14 296 lb 1.9 oz (134.319 kg)     ASSESSMENT AND PLAN:  1. Severe OSA now on CPAP at 13cm H2O and tolerating well. His most recent d/l showed an AHI of 0.5/hr on 13cm H2O and 100% compliance in using more than 4 hours nightly. He has had significant improvement in daytime sleepiness and feeling of well being after starting CPAP. He will continue on current meds. 2. PAF maintaining NSR after increasing BB - CHADS2VASC is 1 (HTN) so no systemic anticoagulation indicated. Continue ASA.  3. HTN - controlled.  Continue metoprolol and Prinizide - check BMET   4.       Chest pain in the setting of PAF with normal stress myoview     Current medicines are reviewed at length with the patient today.  The patient does not have concerns regarding medicines.  The following changes have been made:  no change  Labs/ tests ordered today include: none  No orders of the defined types were placed in this encounter.     Disposition:   FU with me in 6 months   Signed, Quintella ReichertURNER,Dylin Ihnen R, MD  01/12/2015 9:13 AM    [facility] [address] [place], [place], [place]  [address] Phone: [phone]; Fax: [phone]

## 2015-01-12 ENCOUNTER — Ambulatory Visit (INDEPENDENT_AMBULATORY_CARE_PROVIDER_SITE_OTHER): Payer: BLUE CROSS/BLUE SHIELD | Admitting: Cardiology

## 2015-01-12 ENCOUNTER — Encounter: Payer: Self-pay | Admitting: Cardiology

## 2015-01-12 VITALS — BP 120/78 | HR 60 | Ht 73.0 in | Wt 294.8 lb

## 2015-01-12 DIAGNOSIS — Z9989 Dependence on other enabling machines and devices: Secondary | ICD-10-CM

## 2015-01-12 DIAGNOSIS — I10 Essential (primary) hypertension: Secondary | ICD-10-CM

## 2015-01-12 DIAGNOSIS — I480 Paroxysmal atrial fibrillation: Secondary | ICD-10-CM

## 2015-01-12 DIAGNOSIS — G4733 Obstructive sleep apnea (adult) (pediatric): Secondary | ICD-10-CM

## 2015-01-12 LAB — BASIC METABOLIC PANEL
BUN: 12 mg/dL (ref 6–23)
CO2: 28 mEq/L (ref 19–32)
CREATININE: 0.85 mg/dL (ref 0.40–1.50)
Calcium: 9.5 mg/dL (ref 8.4–10.5)
Chloride: 103 mEq/L (ref 96–112)
GFR: 99.17 mL/min (ref >60.00)
Glucose, Bld: 124 mg/dL — ABNORMAL HIGH (ref 70–99)
Potassium: 4 mEq/L (ref 3.5–5.1)
Sodium: 137 mEq/L (ref 135–145)

## 2015-01-12 NOTE — Patient Instructions (Signed)
Your physician wants you to follow-up in: 6 months with Dr. Turner. You will receive a reminder letter in the mail two months in advance. If you don't receive a letter, please call our office to schedule the follow-up appointment.  

## 2015-01-13 ENCOUNTER — Telehealth: Payer: Self-pay | Admitting: Cardiology

## 2015-01-13 NOTE — Telephone Encounter (Signed)
Called patient back. His HR is now 84-93 and he denies any SOB, dizziness or other problems. He states he mostly just wanted Dr. Mayford Knifeurner to know that he was back in it because he had turned his heart monitor back in yesterday. Discussed with Dr. Eldridge DaceVaranasi. He states his ASA is sufficient and just route information to Dr. Mayford Knifeurner for her knowledge. Patient also informed to please call back for any changes or symptoms, such as SOB, dizziness, CP. Patient is also to monitor rate and to call back should HR go above 100 bmp. Patient verbalized understanding and agreement with plan.

## 2015-01-13 NOTE — Telephone Encounter (Signed)
Patient states he went into AFib around lunchtime today. HR is 96-101 consistently. Denies dizziness, SOB, cough or other cardiac sx. Patient does take ASA 81 mg daily. He is going to go ahead and take his dose a little early today. He will sit down and rest for an hour. Nurse will call him back in one hour to check on his status again. However if he should experience any new sx, especially dizziness, SOB, faster HR, cough then he will call Triage back immediately.

## 2015-01-13 NOTE — Telephone Encounter (Signed)
New Message        Pt calling stating that he is in A-fib and wants to speak to someone about it. Please call back and advise.

## 2015-01-18 NOTE — Telephone Encounter (Signed)
Please have patient come in for EKG on Wednesday with nurse

## 2015-01-19 NOTE — Telephone Encounter (Signed)
Left message to call back  

## 2015-01-19 NOTE — Telephone Encounter (Signed)
Patient st he is no longer in Afib. Per Dr. Mayford Knifeurner, it in neccessary for patient to come in for EKG if he is out of afib. Instructed patient to call the office when afib starts again. Patient agrees with treatment plan.

## 2015-01-26 ENCOUNTER — Telehealth: Payer: Self-pay | Admitting: Cardiology

## 2015-01-26 DIAGNOSIS — I480 Paroxysmal atrial fibrillation: Secondary | ICD-10-CM

## 2015-01-26 NOTE — Addendum Note (Signed)
Addended by: Murrell ReddenHARDING, Bailey Kolbe E on: 01/26/2015 04:40 PM   Modules accepted: Orders

## 2015-01-26 NOTE — Telephone Encounter (Signed)
Please let patient know that heart monitor showed NSR with episodes of PAF with RVR.  Nuclear stress test showed no ischemia with EF 48%.  Please repeat 2D echo and if EF ok then will start Flecainide for suppression of PAF

## 2015-01-26 NOTE — Telephone Encounter (Signed)
Notified of heart monitor results and stress test results.  Advised Dr.Turner would like to get a echo to assess his EF.  He is agreeable and advised someone will call him when it is scheduled.

## 2015-01-31 ENCOUNTER — Ambulatory Visit (HOSPITAL_COMMUNITY): Payer: BLUE CROSS/BLUE SHIELD | Attending: Cardiology

## 2015-01-31 ENCOUNTER — Encounter (HOSPITAL_COMMUNITY): Payer: Self-pay

## 2015-01-31 DIAGNOSIS — I480 Paroxysmal atrial fibrillation: Secondary | ICD-10-CM | POA: Insufficient documentation

## 2015-01-31 DIAGNOSIS — I4891 Unspecified atrial fibrillation: Secondary | ICD-10-CM | POA: Diagnosis not present

## 2015-01-31 MED ORDER — PERFLUTREN LIPID MICROSPHERE
1.5000 mL | Freq: Once | INTRAVENOUS | Status: AC
  Administered 2015-01-31: 1.5 mL via INTRAVENOUS

## 2015-01-31 NOTE — Progress Notes (Addendum)
2D Echo with Definity completed. Patient complained of a-fib started Saturday (01/29/2015).  Patsy, RN performed a 12-lead EKG and spoke with Dr. Tenny Crawoss about it. Patient was sent home.  01/31/2015

## 2015-01-31 NOTE — Progress Notes (Signed)
Christopher Phillips (DOB: ) here for an Echo. Baseline rhythm was AFIB with controlled ventricular rate.  IV 20G angiocath (L) hand x 1 by Doyne Keelonya Yount, CNMT. IV Definity 1.5 mL given slow IVP by [name], RN without symptoms. Patient states has been having intermittent palpitations but unable to document AFIB. Patient feels palpitations at present. EKG done revealing Atrial Fibrillation per Dr. Dietrich PatesPaula Ross with order to continue ASA and give EKG to Dr. Mayford Knifeurner to review for documentation. The EKG was given to [facility] Kemp,RN(Dr. Gloris Manchesterraci Turner's nurse). Irean HongPatsy [name], RN.

## 2015-02-02 ENCOUNTER — Telehealth: Payer: Self-pay

## 2015-02-02 ENCOUNTER — Ambulatory Visit (INDEPENDENT_AMBULATORY_CARE_PROVIDER_SITE_OTHER): Payer: BLUE CROSS/BLUE SHIELD

## 2015-02-02 VITALS — BP 130/86 | HR 71 | Ht 73.0 in | Wt 296.4 lb

## 2015-02-02 DIAGNOSIS — Z79899 Other long term (current) drug therapy: Secondary | ICD-10-CM | POA: Diagnosis not present

## 2015-02-02 DIAGNOSIS — I480 Paroxysmal atrial fibrillation: Secondary | ICD-10-CM | POA: Diagnosis not present

## 2015-02-02 MED ORDER — FLECAINIDE ACETATE 50 MG PO TABS: 50.0000 mg | ORAL_TABLET | Freq: Two times a day (BID) | ORAL | Status: DC

## 2015-02-02 NOTE — Telephone Encounter (Signed)
Per Dr. Mayford Knifeurner notes after Echo Results:  Pt called made aware of results and need to come in for EKG. Pt stated he feels he is back in NSR it flipped last night and HR in the 60's. Pt stated today he feels like he is 54 years old now he is back in rhythm. Pt stated he flips into A. Fib about every 2 weeks, last about 2-3 days then he flips back.  Pt made aware of need to come into office, pt to come to our office after he gets off work today (around 3:30pm) as walk-in for EKG check.

## 2015-02-02 NOTE — Patient Instructions (Addendum)
Medication Instructions:  Your physician has recommended you make the following change in your medication:  1) START Flecainide 50 mg tablet twice daily   Labwork: NONE  Testing/Procedures: Your physician has requested that you have an exercise tolerance test. For further information please visit [email or web address]. Please also follow instruction sheet, as given. SCHEDULE WITHIN THE NEXT WEEK    Follow-Up: Your physician recommends that you schedule a follow-up appointment in: 3 weeks with PA/NP  Your physician recommends that you schedule a follow-up appointment in: 2 months with Dr. Mayford Knifeurner    Any Other Special Instructions Will Be Listed Below (If Applicable).

## 2015-02-02 NOTE — Progress Notes (Signed)
1.) Reason for visit: EKG  2.) Name of MD requesting visit: Dr. Mayford Knifeurner   3.) H&P: pt has history of converting in and out of a. Fib. Per the pt he converted back to NS last night.   ROS related to problem: Pt arrived stated he was in NSR.  EKG done, reviewed with DOD, to verify in NSR.  Dr. Mayford Knifeurner paged to make aware.  Ordered for pt to start Flecainide 50 mg BID, have a GXT in the next 7-10 days, follow-up with NP/PA in 3 weeks, follow up with Dr. Mayford Knifeurner in 2 months. Pt unable to schedule anything today. Told pt to called back tomorrow 4/14 to schedule, if he does not call then I will call him on Friday to schedule GXT for early next week.  He agreed with plan and will discuss will with his wife and review his work schedule.

## 2015-02-03 ENCOUNTER — Encounter: Payer: Self-pay | Admitting: Cardiology

## 2015-02-16 ENCOUNTER — Other Ambulatory Visit: Payer: Self-pay

## 2015-02-16 ENCOUNTER — Observation Stay (HOSPITAL_COMMUNITY)
Admission: EM | Admit: 2015-02-16 | Discharge: 2015-02-17 | Disposition: A | Discharge status: Home / Self Care | Payer: BLUE CROSS/BLUE SHIELD | Attending: Cardiology | Admitting: Cardiology

## 2015-02-16 ENCOUNTER — Ambulatory Visit (HOSPITAL_COMMUNITY)
Admission: RE | Admit: 2015-02-16 | Discharge: 2015-02-16 | Disposition: A | Discharge status: Home / Self Care | Payer: BLUE CROSS/BLUE SHIELD | Source: Ambulatory Visit | Attending: Cardiology | Admitting: Cardiology

## 2015-02-16 ENCOUNTER — Encounter (HOSPITAL_COMMUNITY): Payer: Self-pay | Admitting: *Deleted

## 2015-02-16 DIAGNOSIS — Z87891 Personal history of nicotine dependence: Secondary | ICD-10-CM | POA: Insufficient documentation

## 2015-02-16 DIAGNOSIS — E785 Hyperlipidemia, unspecified: Secondary | ICD-10-CM | POA: Diagnosis not present

## 2015-02-16 DIAGNOSIS — I2510 Atherosclerotic heart disease of native coronary artery without angina pectoris: Secondary | ICD-10-CM | POA: Diagnosis not present

## 2015-02-16 DIAGNOSIS — I10 Essential (primary) hypertension: Secondary | ICD-10-CM | POA: Insufficient documentation

## 2015-02-16 DIAGNOSIS — I480 Paroxysmal atrial fibrillation: Principal | ICD-10-CM | POA: Insufficient documentation

## 2015-02-16 DIAGNOSIS — Z79899 Other long term (current) drug therapy: Secondary | ICD-10-CM | POA: Insufficient documentation

## 2015-02-16 DIAGNOSIS — Z6839 Body mass index (BMI) 39.0-39.9, adult: Secondary | ICD-10-CM | POA: Diagnosis not present

## 2015-02-16 DIAGNOSIS — G4733 Obstructive sleep apnea (adult) (pediatric): Secondary | ICD-10-CM | POA: Diagnosis not present

## 2015-02-16 DIAGNOSIS — E119 Type 2 diabetes mellitus without complications: Secondary | ICD-10-CM

## 2015-02-16 DIAGNOSIS — R001 Bradycardia, unspecified: Secondary | ICD-10-CM

## 2015-02-16 DIAGNOSIS — I4891 Unspecified atrial fibrillation: Secondary | ICD-10-CM | POA: Diagnosis not present

## 2015-02-16 DIAGNOSIS — Z7982 Long term (current) use of aspirin: Secondary | ICD-10-CM | POA: Diagnosis not present

## 2015-02-16 DIAGNOSIS — R9439 Abnormal result of other cardiovascular function study: Secondary | ICD-10-CM | POA: Diagnosis present

## 2015-02-16 DIAGNOSIS — Z9989 Dependence on other enabling machines and devices: Secondary | ICD-10-CM | POA: Diagnosis present

## 2015-02-16 HISTORY — DX: Type 2 diabetes mellitus without complications: E11.9

## 2015-02-16 HISTORY — DX: Morbid (severe) obesity due to excess calories: E66.01

## 2015-02-16 HISTORY — DX: Atherosclerotic heart disease of native coronary artery without angina pectoris: I25.10

## 2015-02-16 LAB — CBC WITH DIFFERENTIAL/PLATELET
Basophils Absolute: 0 10*3/uL (ref 0.0–0.1)
Basophils Relative: 1 % (ref 0–1)
EOS PCT: 4 % (ref 0–5)
Eosinophils Absolute: 0.2 10*3/uL (ref 0.0–0.7)
HEMATOCRIT: 45.7 % (ref 39.0–52.0)
Hemoglobin: 16.1 g/dL (ref 13.0–17.0)
LYMPHS ABS: 1.9 10*3/uL (ref 0.7–4.0)
LYMPHS PCT: 32 % (ref 12–46)
MCH: 31.4 pg (ref 26.0–34.0)
MCHC: 35.2 g/dL (ref 30.0–36.0)
MCV: 89.3 fL (ref 78.0–100.0)
Monocytes Absolute: 0.4 10*3/uL (ref 0.1–1.0)
Monocytes Relative: 6 % (ref 3–12)
Neutro Abs: 3.4 10*3/uL (ref 1.7–7.7)
Neutrophils Relative %: 57 % (ref 43–77)
Platelets: 152 10*3/uL (ref 150–400)
RBC: 5.12 MIL/uL (ref 4.22–5.81)
RDW: 13.2 % (ref 11.5–15.5)
WBC: 5.9 10*3/uL (ref 4.0–10.5)

## 2015-02-16 LAB — MRSA PCR SCREENING: MRSA BY PCR: NEGATIVE

## 2015-02-16 LAB — TROPONIN I
Troponin I: <0.03 ng/mL (ref <0.031)
Troponin I: <0.03 ng/mL (ref <0.031)

## 2015-02-16 LAB — I-STAT TROPONIN, ED: Troponin i, poc: 0 ng/mL (ref 0.00–0.08)

## 2015-02-16 LAB — COMPREHENSIVE METABOLIC PANEL
ALT: 31 U/L (ref 0–53)
AST: 30 U/L (ref 0–37)
Albumin: 4.4 g/dL (ref 3.5–5.2)
Alkaline Phosphatase: 54 U/L (ref 39–117)
Anion gap: 11 (ref 5–15)
BUN: 10 mg/dL (ref 6–23)
CALCIUM: 9.4 mg/dL (ref 8.4–10.5)
CO2: 22 mmol/L (ref 19–32)
Chloride: 105 mmol/L (ref 96–112)
Creatinine, Ser: 0.95 mg/dL (ref 0.50–1.35)
Glucose, Bld: 151 mg/dL — ABNORMAL HIGH (ref 70–99)
POTASSIUM: 3.8 mmol/L (ref 3.5–5.1)
SODIUM: 138 mmol/L (ref 135–145)
Total Bilirubin: 0.8 mg/dL (ref 0.3–1.2)
Total Protein: 7.3 g/dL (ref 6.0–8.3)

## 2015-02-16 LAB — HEPARIN LEVEL (UNFRACTIONATED): Heparin Unfractionated: 0.17 IU/mL — ABNORMAL LOW (ref 0.30–0.70)

## 2015-02-16 LAB — TSH: TSH: 1.879 u[IU]/mL (ref 0.350–4.500)

## 2015-02-16 LAB — BRAIN NATRIURETIC PEPTIDE: B Natriuretic Peptide: 51.6 pg/mL (ref 0.0–100.0)

## 2015-02-16 MED ORDER — DEXTROSE 5 % IV SOLN
5.0000 mg/h | INTRAVENOUS | Status: DC
  Administered 2015-02-16: 5 mg/h via INTRAVENOUS

## 2015-02-16 MED ORDER — ASPIRIN 81 MG PO CHEW
81.0000 mg | CHEWABLE_TABLET | ORAL | Status: AC
  Administered 2015-02-17: 81 mg via ORAL
  Filled 2015-02-16: qty 1

## 2015-02-16 MED ORDER — SODIUM CHLORIDE 0.9 % IV SOLN
1.0000 mL/kg/h | INTRAVENOUS | Status: DC
  Administered 2015-02-17: 1 mL/kg/h via INTRAVENOUS

## 2015-02-16 MED ORDER — ONDANSETRON HCL 4 MG/2ML IJ SOLN: 4.0000 mg | Freq: Four times a day (QID) | INTRAMUSCULAR | Status: DC | PRN

## 2015-02-16 MED ORDER — ASPIRIN 81 MG PO CHEW: 81.0000 mg | CHEWABLE_TABLET | Freq: Every day | ORAL | Status: DC

## 2015-02-16 MED ORDER — DILTIAZEM LOAD VIA INFUSION
20.0000 mg | Freq: Once | INTRAVENOUS | Status: AC
  Administered 2015-02-16: 20 mg via INTRAVENOUS

## 2015-02-16 MED ORDER — ACETAMINOPHEN 325 MG PO TABS: 650.0000 mg | ORAL_TABLET | ORAL | Status: DC | PRN

## 2015-02-16 MED ORDER — HEPARIN BOLUS VIA INFUSION
4000.0000 [IU] | Freq: Once | INTRAVENOUS | Status: AC
  Administered 2015-02-16: 4000 [IU] via INTRAVENOUS
  Filled 2015-02-16: qty 4000

## 2015-02-16 MED ORDER — HEPARIN (PORCINE) IN NACL 100-0.45 UNIT/ML-% IJ SOLN
2000.0000 [IU]/h | INTRAMUSCULAR | Status: DC
  Administered 2015-02-16 – 2015-02-17 (×2): 1600 [IU]/h via INTRAVENOUS
  Filled 2015-02-16 (×2): qty 250

## 2015-02-16 MED ORDER — ALLOPURINOL 300 MG PO TABS
300.0000 mg | ORAL_TABLET | Freq: Every day | ORAL | Status: DC
  Administered 2015-02-17: 300 mg via ORAL
  Filled 2015-02-16: qty 1

## 2015-02-16 MED ORDER — METOPROLOL TARTRATE 1 MG/ML IV SOLN
5.0000 mg | Freq: Once | INTRAVENOUS | Status: AC
  Administered 2015-02-16: 5 mg via INTRAVENOUS
  Filled 2015-02-16: qty 5

## 2015-02-16 NOTE — ED Notes (Signed)
Patient was here doing stress test and had noted afib with rvr with rate close to 200.  He was brought to us from heart and vascular. Patient is alert.  He is noted to be diaphoretic.  Patient current rate is 120-140.  Patient denies chest pain.  He has hx of afib.  Patient with no complaints of sob, pulse ox is 95 percent on room air.  Patient is seen by Dr Mayford Knifeurner.  She is aware that he is here.

## 2015-02-16 NOTE — H&P (Signed)
Patient ID: Christopher ChardBarry Phillips MRN: [id number], DOB/AGE: 06/04/1961   Admit date: 02/16/2015   Primary Physician: PROVIDER NOT IN SYSTEM Primary Cardiologist: Dr. Mayford Knifeurner  Pt. Profile:  Christopher Phillips is a 54 y.o. male with a history of HTN, HLD, PAF on Flecainide, low normal EF 50-55% and severe OSA on CPAP who [facility] Flecainide GXT. During his GXT he went into afib with RVR (HRs 200s) and he was sent to the ED for direct admission.   He is followed closely by Dr. Mayford Knifeurner for afib and OSA. He had a recent low risk nuclear stress test on 01/07/15. His CHADS2VASC is 1 (HTN) so systemic anticoagulation was not felt warranted. He was continued on ASA and BB. He was still having issues with PAF and would flip in and out a few times a week so he was started on Flecainide and set up for a GXT. Today [facility]. He presented in NSR and said he had been out of afib since starting Flecanide and was feeling quite well.   He walked 8 minutes 51 seconds on Bruce protocol and the test was terminated as he had reached his target HR of 140 bpm. Just as we were slowing the treadmill down his HR jumped into the 200s and he converted in to afib with RVR. He did feel palpitations and SOB. He slowed down into 150s during recovery but remained in afib. ECG also remarkable for ST depressions in inferolateral leads.   Dr Mayford Knifeurner, his primary cardiologist, came down and recommended admission for HR control and cardiac catheterization for further ischemic evaluation in the setting of ischemic ECG changes and flecainide therapy. Dr. Mayford Knifeurner spoke to the patient's wife who reported that the patient has felt quite tired lately and not seemed himself.    Problem List  Past Medical History  Diagnosis Date  . Hypertension   . Hyperlipidemia   . Complication of anesthesia     slow to wake up  . History of kidney stones   . OSA on CPAP     severe with  AHI 24.84/hr now on CPAP at 13cm H2O  . PAF (paroxysmal atrial fibrillation)     CHADS2VASC is 1 (HTN)    Past Surgical History  Procedure Laterality Date  . Knee surgery      BILATERAL  . Kidney stone surgery      20 YRS AGO  . Nephrolithotomy Right 02/08/2014    Procedure: NEPHROLITHOTOMY PERCUTANEOUS;  Surgeon: Milford Cageaniel Young Woodruff, MD;  Location: [facility];  Service: Urology;  Laterality: Right;  1ST STAGE (RT) PCNL      . Cystoscopy with ureteroscopy Right 02/08/2014    Procedure: Cystoscopy Left retrograde pyleogram, right antegrade pyleogram;  Surgeon: Milford Cageaniel Young Woodruff, MD;  Location: [facility];  Service: Urology;  Laterality: Right;  . Nephrolithotomy Right 02/10/2014    Procedure: RIGHT NEPHROLITHOTOMY PERCUTANEOUS SECOND LOOK;  Surgeon: Milford Cageaniel Young Woodruff, MD;  Location: [facility];  Service: Urology;  Laterality: Right;  2ND STAGE (RT) PCNL      Allergies  No Known Allergies   Home Medications  Prior to Admission medications   Medication Sig Start Date End Date Taking? Authorizing Provider  allopurinol (ZYLOPRIM) 300 MG tablet Take 300 mg by mouth daily. 05/25/14   Historical Provider, MD  aspirin 81 MG chewable tablet Chew 81 mg by mouth daily.    Historical Provider, MD  Flaxseed, Linseed, (  FLAXSEED OIL) 1200 MG CAPS Take 1,200 mg by mouth 2 (two) times daily.    Historical Provider, MD  flecainide (TAMBOCOR) 50 MG tablet Take 1 tablet (50 mg total) by mouth 2 (two) times daily. 02/02/15   Quintella Reichert, MD  lisinopril-hydrochlorothiazide (PRINZIDE,ZESTORETIC) 20-25 MG per tablet Take 1 tablet by mouth every morning.    Historical Provider, MD  metoprolol succinate (TOPROL-XL) 50 MG 24 hr tablet Take 1 tablet (50 mg total) by mouth daily. 12/28/14   Quintella Reichert, MD  Omega-3 Fatty Acids (OMEGA-3 FISH OIL) 1200 MG CAPS Take by mouth 2 (two) times daily.    Historical Provider, MD  Red Yeast Rice 600 MG CAPS Take 600 mg by mouth 2 (two) times daily.    Historical Provider,  MD    Family History  Family History  Problem Relation Age of Onset  . Heart failure Mother 71  . Heart failure Father 29   No family status information on file.     Social History  History   Social History  . Marital Status: Married    Spouse Name: N/A  . Number of Children: N/A  . Years of Education: N/A   Occupational History  . Not on file.   Social History Main Topics  . Smoking status: Former Smoker    Quit date: 01/29/1993  . Smokeless tobacco: Never Used  . Alcohol Use: No  . Drug Use: No  . Sexual Activity: Not on file   Other Topics Concern  . Not on file   Social History Narrative     All other systems reviewed and are otherwise negative except as noted above.  Physical Exam  Blood pressure 120/66, pulse 70, temperature 98.1 F (36.7 C), temperature source Oral, resp. rate 21, height  (1.854 m), SpO2 89 %.  General: Pleasant, NAD. Overweight. Psych: Normal affect. Neuro: Alert and oriented X 3. Moves all extremities spontaneously. HEENT: Normal  Neck: Supple without bruits or JVD. Lungs:  Resp regular and unlabored, CTA. Heart: irreg irreg, tachy. no s3, s4, or murmurs. Abdomen: Soft, non-tender, non-distended, BS + x 4.  Extremities: No clubbing, cyanosis or edema. DP/PT/Radials 2+ and equal bilaterally.  Labs  No results for input(s): CKTOTAL, CKMB, TROPONINI in the last 72 hours. Lab Results  Component Value Date   WBC 5.9 02/16/2015   HGB 16.1 02/16/2015   HCT 45.7 02/16/2015   MCV 89.3 02/16/2015   PLT 152 02/16/2015     Recent Labs Lab 02/16/15 1210  NA 138  K 3.8  CL 105  CO2 22  BUN 10  CREATININE 0.95  CALCIUM 9.4  PROT 7.3  BILITOT 0.8  ALKPHOS 54  ALT 31  AST 30  GLUCOSE 151*   No results found for: CHOL, HDL, LDLCALC, TRIG No results found for: DDIMER   Radiology/Studies  No results found.  ECG HR 130 afib with RVR. Nonspecific ST and T wave abnormality  ASSESSMENT AND PLAN  Christopher Phillips is  a 54 y.o. male with a history of HTN, HLD, PAF on Flecainide, low normal EF 50-55% and severe OSA on CPAP who presented to [facility] for an outpatient Flecainide GXT. During his GXT he went into afib with RVR (HRs 200s) and he was sent to the ED for direct admission.   Afib with RVR- He walked 8 minutes 51 seconds on Bruce protocol and the test was terminated as he had reached his target HR of 140 bpm. Just as we were  slowing the treadmill down his HR jumped into the 200s and he converted in to afib with RVR. He did feel palpitations and SOB. He slowed down into 150s during recovery but remained in afib. ECG also remarkable for significant ST depressions in inferolateral leads.  -- CHADSVASC score 1 (HTN)- will placed on IV heparin -- Continue IV dilt.  Abnormal GXT- -- He had significant ST depression in inferolateral leads and will need a heart cath despite recent low risk Myoview. Especially in the setting of Flecainide therapy. This will be set up for tomorrow.  -- NPO after midnight.   OSA- continue CPAP   Signed, Janetta Hora, PA-C 02/16/2015, 2:02 PM  Pager [phone]

## 2015-02-16 NOTE — Progress Notes (Signed)
ANTICOAGULATION CONSULT NOTE - Initial Consult  Pharmacy Consult for heparin Indication: atrial fibrillation  No Known Allergies  Patient Measurements: Height: 6\' 1"  (185.4 cm) Weight: 294 lb 12.1 oz (133.7 kg) IBW/kg (Calculated) : 79.9 Heparin Dosing Weight: 98 kg  Vital Signs: Temp: 98.2 F (36.8 C) (04/27 1501) Temp Source: Oral (04/27 1501) BP: 117/78 mmHg (04/27 1501) Pulse Rate: 79 (04/27 1501)  Labs:  Recent Labs  02/16/15 1210  HGB 16.1  HCT 45.7  PLT 152  CREATININE 0.95    Estimated Creatinine Clearance: 126 mL/min (by C-G formula based on Cr of 0.95).   Medical History: Past Medical History  Diagnosis Date  . Hypertension   . Hyperlipidemia   . Complication of anesthesia     slow to wake up  . History of kidney stones   . OSA on CPAP     severe with AHI 24.84/hr now on CPAP at 13cm H2O  . PAF (paroxysmal atrial fibrillation)     CHADS2VASC is 1 (HTN)    Medications:  Prescriptions prior to admission  Medication Sig Dispense Refill Last Dose  . allopurinol (ZYLOPRIM) 300 MG tablet Take 300 mg by mouth daily.   02/16/2015 at Unknown time  . aspirin 81 MG chewable tablet Chew 81 mg by mouth daily.   02/16/2015 at Unknown time  . Flaxseed, Linseed, (FLAXSEED OIL) 1200 MG CAPS Take 1,200 mg by mouth 2 (two) times daily.   02/16/2015 at Unknown time  . flecainide (TAMBOCOR) 50 MG tablet Take 1 tablet (50 mg total) by mouth 2 (two) times daily. 180 tablet 3 02/16/2015 at Unknown time  . lisinopril-hydrochlorothiazide (PRINZIDE,ZESTORETIC) 20-25 MG per tablet Take 1 tablet by mouth every morning.   02/16/2015 at Unknown time  . MAGNESIUM PO Take 1 tablet by mouth daily.   02/16/2015 at Unknown time  . metoprolol succinate (TOPROL-XL) 50 MG 24 hr tablet Take 1 tablet (50 mg total) by mouth daily. 90 tablet 3 02/15/2015 at 1800  . Omega-3 Fatty Acids (OMEGA-3 FISH OIL) 1200 MG CAPS Take by mouth 2 (two) times daily.   02/16/2015 at Unknown time  . Red Yeast Rice  600 MG CAPS Take 600 mg by mouth 2 (two) times daily.   02/16/2015 at Unknown time    Assessment: 54 yo man to start heparin for afib.  He was not on anticoagulants pta.  His baseline Hg 16.1 and PTLC 152. Goal of Therapy:  Heparin level 0.3-0.7 units/ml Monitor platelets by anticoagulation protocol: Yes   Plan:  Heparin bolus 4000 units and drip at 1600 units/hr Check heparin level 6 hours after start Daily HL and CC while on heparin Monitor for bleeding complications  Thanks for allowing pharmacy to be a part of this patient's care.  Talbert CageLora Haedyn Ancrum, PharmD Clinical Pharmacist, [phone] 02/16/2015,3:39 PM

## 2015-02-16 NOTE — ED Provider Notes (Signed)
CSN: [id number]     Arrival date & time 02/16/15  1148 History   First MD Initiated Contact with Patient 02/16/15 1204     Chief Complaint  Patient presents with  . Tachycardia     (Consider location/radiation/quality/duration/timing/severity/associated sxs/prior Treatment) HPI Patient has history of H of fibrillation for which she had been started on flecainide within about the past 2 weeks. Due to being on the fleck in night he was in getting an elective stress test today. He reports that his heart rate went up he felt himself go into atrial fibrillation. He denies any associated chest pain. He could perceive the palpitations. He denies any other associated symptoms. He did not feel syncopal nor short of breath. He has had a number of prior episodes of A. fib and he reports this feels similar. He reports he felt well before going into the study today. He reports that he held his Lopressor this morning based on instructions for a stress test. He otherwise took his regular a.m. flecainide dose. Past Medical History  Diagnosis Date  . Hypertension   . Hyperlipidemia   . Complication of anesthesia     slow to wake up  . History of kidney stones   . OSA on CPAP     severe with AHI 24.84/hr now on CPAP at 13cm H2O  . PAF (paroxysmal atrial fibrillation)     CHADS2VASC is 1 (HTN)   Past Surgical History  Procedure Laterality Date  . Knee surgery      BILATERAL  . Kidney stone surgery      20 YRS AGO  . Nephrolithotomy Right 02/08/2014    Procedure: NEPHROLITHOTOMY PERCUTANEOUS;  Surgeon: Milford Cage, MD;  Location: [facility];  Service: Urology;  Laterality: Right;  1ST STAGE (RT) PCNL      . Cystoscopy with ureteroscopy Right 02/08/2014    Procedure: Cystoscopy Left retrograde pyleogram, right antegrade pyleogram;  Surgeon: Milford Cage, MD;  Location: [facility];  Service: Urology;  Laterality: Right;  . Nephrolithotomy Right 02/10/2014    Procedure: RIGHT NEPHROLITHOTOMY  PERCUTANEOUS SECOND LOOK;  Surgeon: Milford Cage, MD;  Location: [facility];  Service: Urology;  Laterality: Right;  2ND STAGE (RT) PCNL    Family History  Problem Relation Age of Onset  . Heart failure Mother 66  . Heart failure Father 55   History  Substance Use Topics  . Smoking status: Former Smoker    Quit date: 01/29/1993  . Smokeless tobacco: Never Used  . Alcohol Use: No    Review of Systems  10 Systems reviewed and are negative for acute change except as noted in the HPI.   Allergies  Review of patient's allergies indicates no known allergies.  Home Medications   Prior to Admission medications   Medication Sig Start Date End Date Taking? Authorizing Provider  allopurinol (ZYLOPRIM) 300 MG tablet Take 300 mg by mouth daily. 05/25/14   Historical Provider, MD  aspirin 81 MG chewable tablet Chew 81 mg by mouth daily.    Historical Provider, MD  Flaxseed, Linseed, (FLAXSEED OIL) 1200 MG CAPS Take 1,200 mg by mouth 2 (two) times daily.    Historical Provider, MD  flecainide (TAMBOCOR) 50 MG tablet Take 1 tablet (50 mg total) by mouth 2 (two) times daily. 02/02/15   Quintella Reichert, MD  lisinopril-hydrochlorothiazide (PRINZIDE,ZESTORETIC) 20-25 MG per tablet Take 1 tablet by mouth every morning.    Historical Provider, MD  metoprolol succinate (TOPROL-XL) 50 MG 24  hr tablet Take 1 tablet (50 mg total) by mouth daily. 12/28/14   Quintella Reichertraci R Turner, MD  Omega-3 Fatty Acids (OMEGA-3 FISH OIL) 1200 MG CAPS Take by mouth 2 (two) times daily.    Historical Provider, MD  Red Yeast Rice 600 MG CAPS Take 600 mg by mouth 2 (two) times daily.    Historical Provider, MD   BP 120/66 mmHg  Pulse 70  Temp(Src) 98.1 F (36.7 C) (Oral)  Resp 21  Ht 6\' 1"  (1.854 m)  SpO2 89% Physical Exam  Constitutional: He is oriented to person, place, and time.  Patient is moderately obese. He is nontoxic and alert. He is well in appearance. He shows no signs of respiratory distress. His color is good.  His mental status is clear.  HENT:  Head: Normocephalic and atraumatic.  Mouth/Throat: Oropharynx is clear and moist.  Eyes: EOM are normal. Pupils are equal, round, and reactive to light.  Neck: Neck supple.  Cardiovascular: Normal heart sounds and intact distal pulses.   Heart is irregularly irregular with tachycardia.  Pulmonary/Chest: Effort normal and breath sounds normal.  Abdominal: Soft. Bowel sounds are normal. He exhibits no distension. There is no tenderness.  Musculoskeletal: Normal range of motion. He exhibits no edema.  Neurological: He is alert and oriented to person, place, and time. He has normal strength. Coordination normal. GCS eye subscore is 4. GCS verbal subscore is 5. GCS motor subscore is 6.  Skin: Skin is warm, dry and intact.  Psychiatric: He has a normal mood and affect.    ED Course  Procedures (including critical care time) Labs Review Labs Reviewed  COMPREHENSIVE METABOLIC PANEL - Abnormal; Notable for the following:    Glucose, Bld 151 (*)    All other components within normal limits  CBC WITH DIFFERENTIAL/PLATELET  I-STAT TROPOININ, ED    Imaging Review No results found.   EKG Interpretation   Date/Time:  Wednesday February 16 2015 11:57:03 EDT Ventricular Rate:  130 PR Interval:    QRS Duration: 90 QT Interval:  310 QTC Calculation: 456 R Axis:   40 Text Interpretation:  Atrial fibrillation with rapid ventricular response  Nonspecific ST and T wave abnormality Abnormal ECG agree Confirmed by  Donnald GarrePfeiffer, MD, Lebron ConnersMarcy [id number]) on 02/16/2015 12:05:23 PM     Recheck 1300. Patient's heart rate is in the 70s. Monitor continues to show H of fibrillation. The patient is in no distress and feels well. CRITICAL CARE Performed by: Arby BarrettePfeiffer, Kaizer Dissinger   Total critical care time: 30 Critical care time was exclusive of separately billable procedures and treating other patients.  Critical care was necessary to treat or prevent imminent or life-threatening  deterioration.  Critical care was time spent personally by me on the following activities: development of treatment plan with patient and/or surrogate as well as nursing, discussions with consultants, evaluation of patient's response to treatment, examination of patient, obtaining history from patient or surrogate, ordering and performing treatments and interventions, ordering and review of laboratory studies, ordering and review of radiographic studies, pulse oximetry and re-evaluation of patient's condition. MDM   Final diagnoses:  Atrial fibrillation with rapid ventricular response   The patient had undergone stress testing today and developed H of fibrillation with rapid ventricular response. He right in emergency department with a heart rate in the 140s. He responded to Lopressor IV with rate control and continued to remain in atrial flutter fibrillation.. Cardiology is admitting the patient for ongoing treatment reporting that he did have significant ST  depressions during his stress test.    Arby Barrette, MD 02/16/15 1350

## 2015-02-16 NOTE — Progress Notes (Signed)
Pt placed pt on CPAP. Nasal mask, 13cm H20. No O2 bled in. Water added to humidity chamber. Mask fit to patient. Pt stated that it was comfortable. RT explained to patient if he has any questions please have RN contact RT. Pt did state that he may want his family to bring in his home machine if he does not tolerate our machine. I advised pt that BIOMED must check his machine before it can be used. He normally wears Nasal pillows at home and is worried ours will not be comfortable. I told patient that he can just bring mask/tubing if he would like and it can be used with out machine. RT will pass to dayshift.

## 2015-02-16 NOTE — ED Notes (Signed)
Pt placed in gown and in bed. Pt monitored by pulse ox, bp cuff, and 12-lead. 

## 2015-02-16 NOTE — Progress Notes (Signed)
Patient HR dropped below 40 per monitor alarm, HR currently 58. Stopped Cardizem gtt. Spoke with Dr. Okey DupreEnd from Cardiology. Patient was sleeping at the time.

## 2015-02-16 NOTE — ED Notes (Signed)
Informed by EDP that patient had ST depression during stress test today needing the patient to go to stepdown.  Pt denies chest pain or sob at this time.  His 02 sats drop to the upper 80s when he is sleeping (wheres cpap at home).  Pt is on Cardizem at 5cc/hr with rate in the 60-70s. .  Pt is 94% on RA.

## 2015-02-17 ENCOUNTER — Encounter (HOSPITAL_COMMUNITY)
Admission: EM | Disposition: A | Discharge status: Home / Self Care | Payer: Self-pay | Source: Home / Self Care | Attending: Emergency Medicine

## 2015-02-17 ENCOUNTER — Encounter (HOSPITAL_COMMUNITY): Payer: Self-pay | Admitting: Physician Assistant

## 2015-02-17 DIAGNOSIS — I2510 Atherosclerotic heart disease of native coronary artery without angina pectoris: Secondary | ICD-10-CM | POA: Diagnosis not present

## 2015-02-17 DIAGNOSIS — E785 Hyperlipidemia, unspecified: Secondary | ICD-10-CM

## 2015-02-17 DIAGNOSIS — I480 Paroxysmal atrial fibrillation: Secondary | ICD-10-CM | POA: Diagnosis not present

## 2015-02-17 DIAGNOSIS — I10 Essential (primary) hypertension: Secondary | ICD-10-CM | POA: Diagnosis not present

## 2015-02-17 DIAGNOSIS — R001 Bradycardia, unspecified: Secondary | ICD-10-CM

## 2015-02-17 DIAGNOSIS — G4733 Obstructive sleep apnea (adult) (pediatric): Secondary | ICD-10-CM | POA: Diagnosis not present

## 2015-02-17 DIAGNOSIS — E119 Type 2 diabetes mellitus without complications: Secondary | ICD-10-CM

## 2015-02-17 HISTORY — PX: LEFT HEART CATHETERIZATION WITH CORONARY ANGIOGRAM: SHX5451

## 2015-02-17 LAB — LIPID PANEL
CHOL/HDL RATIO: 7 ratio
Cholesterol: 231 mg/dL — ABNORMAL HIGH (ref 0–200)
HDL: 33 mg/dL — AB (ref >39)
LDL Cholesterol: 155 mg/dL — ABNORMAL HIGH (ref 0–99)
Triglycerides: 215 mg/dL — ABNORMAL HIGH (ref <150)
VLDL: 43 mg/dL — ABNORMAL HIGH (ref 0–40)

## 2015-02-17 LAB — CBC
HEMATOCRIT: 50.1 % (ref 39.0–52.0)
Hemoglobin: 17.3 g/dL — ABNORMAL HIGH (ref 13.0–17.0)
MCH: 31.2 pg (ref 26.0–34.0)
MCHC: 34.5 g/dL (ref 30.0–36.0)
MCV: 90.3 fL (ref 78.0–100.0)
Platelets: 159 10*3/uL (ref 150–400)
RBC: 5.55 MIL/uL (ref 4.22–5.81)
RDW: 13.3 % (ref 11.5–15.5)
WBC: 7.2 10*3/uL (ref 4.0–10.5)

## 2015-02-17 LAB — BASIC METABOLIC PANEL
Anion gap: 10 (ref 5–15)
BUN: 7 mg/dL (ref 6–23)
CALCIUM: 9.2 mg/dL (ref 8.4–10.5)
CO2: 25 mmol/L (ref 19–32)
Chloride: 103 mmol/L (ref 96–112)
Creatinine, Ser: 0.87 mg/dL (ref 0.50–1.35)
GFR calc Af Amer: >90 mL/min (ref >90)
GLUCOSE: 131 mg/dL — AB (ref 70–99)
Potassium: 3.7 mmol/L (ref 3.5–5.1)
Sodium: 138 mmol/L (ref 135–145)

## 2015-02-17 LAB — GLUCOSE, CAPILLARY
GLUCOSE-CAPILLARY: 144 mg/dL — AB (ref 70–99)
Glucose-Capillary: 129 mg/dL — ABNORMAL HIGH (ref 70–99)

## 2015-02-17 LAB — HEMOGLOBIN A1C
Hgb A1c MFr Bld: 6.5 % — ABNORMAL HIGH (ref 4.8–5.6)
MEAN PLASMA GLUCOSE: 140 mg/dL

## 2015-02-17 LAB — TROPONIN I

## 2015-02-17 LAB — PROTIME-INR
INR: 1.04 (ref 0.00–1.49)
Prothrombin Time: 13.7 seconds (ref 11.6–15.2)

## 2015-02-17 SURGERY — LEFT HEART CATHETERIZATION WITH CORONARY ANGIOGRAM

## 2015-02-17 MED ORDER — LIVING WELL WITH DIABETES BOOK
Freq: Once | Status: AC
  Administered 2015-02-17: 10:00:00
  Filled 2015-02-17: qty 1

## 2015-02-17 MED ORDER — HEPARIN SODIUM (PORCINE) 1000 UNIT/ML IJ SOLN
INTRAMUSCULAR | Status: AC
  Filled 2015-02-17: qty 1

## 2015-02-17 MED ORDER — NITROGLYCERIN 1 MG/10 ML FOR IR/CATH LAB
INTRA_ARTERIAL | Status: AC
Start: 2015-02-17 — End: 2015-02-17
  Filled 2015-02-17: qty 10

## 2015-02-17 MED ORDER — FLECAINIDE ACETATE 100 MG PO TABS: 100.0000 mg | ORAL_TABLET | Freq: Two times a day (BID) | ORAL | Status: DC

## 2015-02-17 MED ORDER — METOPROLOL SUCCINATE ER 50 MG PO TB24
50.0000 mg | ORAL_TABLET | Freq: Every day | ORAL | Status: DC
Start: 2015-02-17 — End: 2015-02-18
  Administered 2015-02-17: 50 mg via ORAL
  Filled 2015-02-17: qty 1

## 2015-02-17 MED ORDER — MIDAZOLAM HCL 2 MG/2ML IJ SOLN
INTRAMUSCULAR | Status: AC
Start: 2015-02-17 — End: 2015-02-17
  Filled 2015-02-17: qty 2

## 2015-02-17 MED ORDER — DILTIAZEM HCL 30 MG PO TABS: 30.0000 mg | ORAL_TABLET | Freq: Four times a day (QID) | ORAL | Status: DC

## 2015-02-17 MED ORDER — FLECAINIDE ACETATE 100 MG PO TABS
100.0000 mg | ORAL_TABLET | Freq: Two times a day (BID) | ORAL | Status: DC
  Filled 2015-02-17: qty 1

## 2015-02-17 MED ORDER — HEPARIN BOLUS VIA INFUSION
3000.0000 [IU] | Freq: Once | INTRAVENOUS | Status: AC
  Administered 2015-02-17: 3000 [IU] via INTRAVENOUS
  Filled 2015-02-17: qty 3000

## 2015-02-17 MED ORDER — HEPARIN (PORCINE) IN NACL 2-0.9 UNIT/ML-% IJ SOLN
INTRAMUSCULAR | Status: AC
  Filled 2015-02-17: qty 500

## 2015-02-17 MED ORDER — INSULIN ASPART 100 UNIT/ML ~~LOC~~ SOLN
0.0000 [IU] | Freq: Three times a day (TID) | SUBCUTANEOUS | Status: DC
  Administered 2015-02-17 (×2): 1 [IU] via SUBCUTANEOUS

## 2015-02-17 MED ORDER — SODIUM CHLORIDE 0.9 % IV SOLN: INTRAVENOUS | Status: AC

## 2015-02-17 MED ORDER — POTASSIUM CHLORIDE CRYS ER 20 MEQ PO TBCR
40.0000 meq | EXTENDED_RELEASE_TABLET | ORAL | Status: AC
  Administered 2015-02-17: 40 meq via ORAL
  Filled 2015-02-17: qty 2

## 2015-02-17 MED ORDER — RIVAROXABAN 20 MG PO TABS: 20.0000 mg | ORAL_TABLET | Freq: Every day | ORAL | Status: DC

## 2015-02-17 MED ORDER — FENTANYL CITRATE (PF) 100 MCG/2ML IJ SOLN
INTRAMUSCULAR | Status: AC
  Filled 2015-02-17: qty 2

## 2015-02-17 MED ORDER — ATORVASTATIN CALCIUM 80 MG PO TABS
80.0000 mg | ORAL_TABLET | Freq: Every day | ORAL | Status: DC
Start: 2015-02-17 — End: 2015-02-18
  Administered 2015-02-17: 80 mg via ORAL
  Filled 2015-02-17: qty 1

## 2015-02-17 MED ORDER — ATORVASTATIN CALCIUM 80 MG PO TABS: 80.0000 mg | ORAL_TABLET | Freq: Every evening | ORAL | Status: DC

## 2015-02-17 MED ORDER — LIDOCAINE HCL (PF) 1 % IJ SOLN
INTRAMUSCULAR | Status: AC
Start: 2015-02-17 — End: 2015-02-17
  Filled 2015-02-17: qty 30

## 2015-02-17 NOTE — Progress Notes (Signed)
Patient up to bathroom, HR up to 144, once back in bed HR 60s-70s. Dr. Okey DupreEnd with Cardiology called. He would like Cardizem drip to remain off. Patient with no chest pain or shortness of breath. Will continue to monitor.

## 2015-02-17 NOTE — Discharge Summary (Signed)
Discharge Summary   Patient ID: Christopher Phillips,  MRN: [id number], DOB/AGE: Nov 23, 1960 54 y.o.  Admit date: 02/16/2015 Discharge date: 02/17/2015  Primary Care Provider: PROVIDER NOT IN SYSTEM Primary Cardiologist: Dr. Mayford Knife  Discharge Diagnoses Active Problems:   Morbid obesity   Essential hypertension, benign   OSA on CPAP   Abnormal stress electrocardiogram test using treadmill   Atrial fibrillation with rapid ventricular response   Diabetes mellitus   Hyperlipidemia   Sinus bradycardia   Allergies No Known Allergies  Procedures  Cath 02/17/2015 Angiographic Findings:  Left main: No obstructive disease  Left Anterior Descending Artery: Large caliber vessel that courses to the apex. The proximal vessel has mild plaque. The mid vessel has a tubular 40% stenosis after the takeoff of the first diagonal branch. The distal LAD has mild diffuse plaque. The first diagonal branch is a moderate caliber vessel with mild plaque. The second diagonal branch is a small caliber vessel with ostial 70% stenosis.   Circumflex Artery: Large caliber vessel with diffuse 20% stenosis in the proximal and mid vessel. The first obtuse marginal branch is a moderate caliber vessel with mild plaque. The second obtuse marginal branch is a moderate caliber vessel with mild plaque disease.   Right Coronary Artery: Large dominant vessel with proximal 30% stenosis. The mid and distal vessel has mild plaque. The PDA is a moderate caliber vessel with diffuse 30% stenosis.   Left Ventricular Angiogram: LVEF=60-65%.   Impression: 1. Non-obstructive coronary artery disease.  2. Normal LV systolic function  History of Present Illness   [name] is a 54 y.o. male with a history of HTN, HLD, PAF on Flecainide, low normal EF 50-55% and severe OSA on CPAP who presented to [facility] for an outpatient Flecainide GXT. During his GXT he went into afib with RVR (HRs 200s) and he was sent to the ED 4/27 for direct  admission.    He is followed closely by Dr. Mayford Knife for afib and OSA. He had a recent low risk nuclear stress test on 01/07/15. His CHADS2VASC was previously felt to be 1 (HTN - but see below for updated info) so systemic anticoagulation was not felt warranted. He was continued on ASA and BB. He was still having issues with PAF and would flip in and out a few times a week so he was started on Flecainide and set up for a GXT. Yesterday he presented to the [facility] for GXT. He presented in NSR and said he had been out of afib since starting Flecanide and was feeling quite well. He walked 8 minutes 51 seconds on Bruce protocol and the test was terminated as he had reached his target HR of 140 bpm. Just as we were slowing the treadmill down his HR jumped into the 200s and he converted in to afib with RVR. He did feel palpitations and SOB. He slowed down into 150s during recovery but remained in afib. ECG also remarkable for ST depressions in inferolateral leads. Dr Mayford Knife, his primary cardiologist, recommended admission for HR control and cardiac catheterization for further ischemic evaluation in the setting of ischemic ECG changes and flecainide therapy.   Hospital Course  1. PAF with RVR/abnormal GXT -- Pt was placed on IV heparin and IV dilt. Flecainide was held. His HR dropped below 40 at evening of 4/27, and Cardizem gtt was discontinued. His heart rate remained below 80 in AM of 4/28, however at times jumped over 120s. His home does of Toprol- XL  was started. Lytes, TSH normal and Creatinine were normal. Trop x3 negative. He denied chest pain or SOB. At times, experienced palpation. He was spontaneously converted to NSR prior to his cath. Cath 4/28 reveled mild LAD 40% stenosis, distal LAD has mild diffuse plaque, 2nd diagonal branch 70% stenosis, circu with diffuse 20% stenosis, RCA with proximal 30% stenosis. LVEF 60-65%. After discussion between Dr. Johney FrameAllred and Dr. Anne FuSkains, his  Flecainide increased to 100mg  BID. Dr. Johney FrameAllred did not feel that his nonobstructive CAD precluded flecainide use. Risk and benefit was also discussed with the patient. Dr. Johney FrameAllred recommended outpatient f/u in afib clinic within a week. Dr. Johney FrameAllred recommended Tikosyn as next option if he fails medical therapy with flecainide, and aggressive attempts at lifestyle modifications before considering ablation. After considering his HTN, probable new diagnosis of DM (A1C found to be 6.5), and CAD, his CHADS2VASCs score is 3. Dr. Anne FuSkains put him on a Xarelto 20mg  starting 4/29 and plans for anticoagulation for at least 4 weeks. Plan for further management of anticoagulation per Dr. Allred/Dr. Mayford Knifeurner. His BP and HR remained stable and denied symptoms of chest pain, orthopnea, PND, lower extremity edema, dizziness, presyncope, syncope, or neurologic sequela on discharge. Plan to resume home Toprol XL 50mg  and Lisi/HCTZ 20-25mg ; His Flecainide increased to 100mg  BID. Note that BB could not be increased because sinus HR tended to run in 50s-60s. Start Lipitor 80mg  today and Xarelto 20mg  tomorrow as long as cath site is not bleeding.   2. HL --He was started on Lipitor 80mg  for 02/17/2015: Cholesterol, Total 231*; HDL-C 33*; LDL (calc) 155*; Triglycerides 215*; VLDL 43*.    3.DM --He was given diabetic education for his HgbA1c 6.5 and advice to f/u with PCP.   Discharge Vitals Blood pressure 131/84, pulse 66, temperature 97.7 F (36.5 C), temperature source Oral, resp. rate 20, height 6\' 1"  (1.854 m), weight 290 lb 8 oz (131.77 kg), SpO2 97 %.  Filed Weights   02/16/15 1501 02/17/15 0404  Weight: 294 lb 12.1 oz (133.7 kg) 290 lb 8 oz (131.77 kg)    Labs  CBC  Recent Labs  02/16/15 1210 02/17/15 0500  WBC 5.9 7.2  NEUTROABS 3.4  --   HGB 16.1 17.3*  HCT 45.7 50.1  MCV 89.3 90.3  PLT 152 159   Basic Metabolic Panel  Recent Labs  02/16/15 1210 02/17/15 0500  NA 138 138  K 3.8 3.7  CL 105 103  CO2  22 25  GLUCOSE 151* 131*  BUN 10 7  CREATININE 0.95 0.87  CALCIUM 9.4 9.2   Liver Function Tests  Recent Labs  02/16/15 1210  AST 30  ALT 31  ALKPHOS 54  BILITOT 0.8  PROT 7.3  ALBUMIN 4.4   Cardiac Enzymes  Recent Labs  02/16/15 1603 02/16/15 2149 02/17/15 0500  TROPONINI <0.03 <0.03 <0.03   Hemoglobin A1C  Recent Labs  02/16/15 1603  HGBA1C 6.5*   Fasting Lipid Panel  Recent Labs  02/17/15 0500  CHOL 231*  HDL 33*  LDLCALC 155*  TRIG 215*  CHOLHDL 7.0   Thyroid Function Tests  Recent Labs  02/16/15 1603  TSH 1.879    Disposition  Pt is being discharged home today in good condition.  Follow-up Plans & Appointments      Follow-up Information    [facility]   Specialty:  Cardiology   Why:  Office will call you for your follow-up appointment in the atrial fib  clinic. This may be at a different location. Please make sure you verify when they call.   Contact information:   [address] [place] [address] [phone]      Follow up with Primary Care Provider.   Why:  Your blood sugar was high. HgbA1C 6.5. Management per PCP. Will start on high dose statin for prevention of CAD. Weight and dietary management.    Discharge instruction No driving for 2 days. No lifting over 5 lbs for 1 week. No sexual activity for 1 week. You may return to work on Monday. Keep procedure site clean & dry. If you notice increased pain, swelling, bleeding or pus, call/return! You may shower, but no soaking baths/hot tubs/pools for 1 week.   F/u Labs/Studies Consider OP f/u labs 6-8 weeks given statin initiation this admission.   Discharge Medications    Medication List    STOP taking these medications        aspirin 81 MG chewable tablet      TAKE these medications        allopurinol 300 MG tablet  Commonly known as:  ZYLOPRIM  Take 300 mg by mouth daily.     atorvastatin 80 MG  tablet  Commonly known as:  LIPITOR  Take 1 tablet (80 mg total) by mouth every evening.     Flaxseed Oil 1200 MG Caps  Take 1,200 mg by mouth 2 (two) times daily.     flecainide 100 MG tablet  Commonly known as:  TAMBOCOR  Take 1 tablet (100 mg total) by mouth 2 (two) times daily.     lisinopril-hydrochlorothiazide 20-25 MG per tablet  Commonly known as:  PRINZIDE,ZESTORETIC  Take 1 tablet by mouth every morning.     MAGNESIUM PO  Take 1 tablet by mouth daily.     metoprolol succinate 50 MG 24 hr tablet  Commonly known as:  TOPROL-XL  Take 1 tablet (50 mg total) by mouth daily.     Omega-3 Fish Oil 1200 MG Caps  Take by mouth 2 (two) times daily.     Red Yeast Rice 600 MG Caps  Take 600 mg by mouth 2 (two) times daily.     rivaroxaban 20 MG Tabs tablet  Commonly known as:  XARELTO  Take 1 tablet (20 mg total) by mouth daily with supper.  Start taking on:  02/18/2015        Duration of Discharge Encounter   Greater than 30 minutes including physician time.  Signed, Bhagat,Bhavinkumar PA-C 02/17/2015, 8:17 PM  Personally seen and examined. Agree with above. Discussed with Dr. Johney Frame, appreciate consultation. Given his hemoglobin A1c of 6.5, new diagnosis of diabetes potentially and recent episode of atrial fibrillation, we will place on anticoagulation, Xarelto at least for the next 4 weeks. Recommend follow-up with primary care physician for further evaluation for diabetes.  Dr. Johney Frame was comfortable with utilization of increased use of flecainide 100 mg twice a day. He does not have flow limiting coronary artery disease although moderate plaque is noted on angiogram.  Close follow-up.  Donato Schultz, MD

## 2015-02-17 NOTE — Progress Notes (Signed)
D/c instructions reviewed with patient and wife, they both verbalized understanding. Xarelto prescription and pharmacy card given to patient with instructions on how to use. VSS, no bleeding at R radial vascular site upon discharge. All belongings returned to patient.

## 2015-02-17 NOTE — Progress Notes (Signed)
Pt converted to NSR, HR 70s. Pt on way to cath lab, MD updated. Will continue to monitor.

## 2015-02-17 NOTE — Discharge Instructions (Signed)

## 2015-02-17 NOTE — Progress Notes (Signed)
Pressure dressing applied to R radial site, no bleeding, VSS

## 2015-02-17 NOTE — Interval H&P Note (Signed)
History and Physical Interval Note:  02/17/2015 1:55 PM  Christopher Phillips  has presented today for cardiac cath with the diagnosis of abnormal stress test.  The various methods of treatment have been discussed with the patient and family. After consideration of risks, benefits and other options for treatment, the patient has consented to  Procedure(s): LEFT HEART CATHETERIZATION WITH CORONARY ANGIOGRAM (N/A) as a surgical intervention .  The patient's history has been reviewed, patient examined, no change in status, stable for surgery.  I have reviewed the patient's chart and labs.  Questions were answered to the patient's satisfaction.    Cath Lab Visit (complete for each Cath Lab visit)  Clinical Evaluation Leading to the Procedure:   ACS: No.  Non-ACS:    Anginal Classification: CCS II  Anti-ischemic medical therapy: No Therapy  Non-Invasive Test Results: No non-invasive testing performed  Prior CABG: No previous CABG         [name]

## 2015-02-17 NOTE — Progress Notes (Signed)
ANTICOAGULATION CONSULT NOTE   Pharmacy Consult for heparin Indication: atrial fibrillation  No Known Allergies  Patient Measurements: Height: 6\' 1"  (185.4 cm) Weight: 290 lb 8 oz (131.77 kg) IBW/kg (Calculated) : 79.9 Heparin Dosing Weight: 98 kg  Vital Signs: Temp: 98.3 F (36.8 C) (04/28 1128) Temp Source: Oral (04/28 1128) BP: 130/94 mmHg (04/28 1128) Pulse Rate: 129 (04/28 1128)  Labs:  Recent Labs  02/16/15 1210 02/16/15 1603 02/16/15 2149 02/17/15 0500  HGB 16.1  --   --  17.3*  HCT 45.7  --   --  50.1  PLT 152  --   --  159  LABPROT  --   --   --  13.7  INR  --   --   --  1.04  HEPARINUNFRC  --   --  0.17*  --   CREATININE 0.95  --   --  0.87  TROPONINI  --  <0.03 <0.03 <0.03    Estimated Creatinine Clearance: 136.6 mL/min (by C-G formula based on Cr of 0.87).    Assessment: 54 yo man on heparin for afib.  He was not on anticoagulants pta.  His baseline Hg 16.1 and PTLC 152.  His HL after a 4000 units bolus and dripat 1600 units/hr is below goal at 0.17 at 2149 4/27.   CBC stable, no bleeding reported. Cath scheduled for 1330  Goal of Therapy:  Heparin level 0.3-0.7 units/ml Monitor platelets by anticoagulation protocol: Yes   Plan:  Heparin bolus 3000 units and increase drip to 2000 units/hr Daily HL and CBC while on heparin Monitor for bleeding complications  Herby AbrahamMichelle T. Dresden Lozito, Pharm.D. [phone] 02/17/2015 12:05 PM

## 2015-02-17 NOTE — Care Management Note (Unsigned)
    Page 1 of 1   02/17/2015     4:05:24 PM CARE MANAGEMENT NOTE 02/17/2015  Patient:  Christopher Phillips,Christopher Phillips   Account Number:  [id number]  Date Initiated:  02/17/2015  Documentation initiated by:  GRAVES-BIGELOW,Kilee Hedding  Subjective/Objective Assessment:   Pt admitted for cp. S/p cath. Plan for Xarelto vs Eliquis for home.     Action/Plan:   Benefits check in process for medications. Once completed will provide 30 day free/ co pay card.   Anticipated DC Date:  02/18/2015   Anticipated DC Plan:  HOME/SELF CARE      DC Planning Services  CM consult      Choice offered to / List presented to:             Status of service:  In process, will continue to follow Medicare Important Message given?  NO (If response is "NO", the following Medicare IM given date fields will be blank) Date Medicare IM given:   Medicare IM given by:   Date Additional Medicare IM given:   Additional Medicare IM given by:    Discharge Disposition:    Per UR Regulation:  Reviewed for med. necessity/level of care/duration of stay  If discussed at Long Length of Stay Meetings, dates discussed:    Comments:

## 2015-02-17 NOTE — Plan of Care (Signed)
Problem: Food- and Nutrition-Related Knowledge Deficit (NB-1.1) Goal: Nutrition education Formal process to instruct or train a patient/client in a skill or to impart knowledge to help patients/clients voluntarily manage or modify food choices and eating behavior to maintain or improve health. Outcome: Completed/Met Date Met:  02/17/15  RD consulted for nutrition education regarding diabetes.   Patient reports that he has recently switched to a vegetarian diet 6 weeks ago and has lost from 317 lbs to 290 lbs today. He sees a PA and a dietitian at his job. He does not want to see an OP dietitian at the nutrition and diabetes management center. Emphasized following a balanced diet.    Lab Results  Component Value Date    HGBA1C 6.5* 02/16/2015    RD provided "Carbohydrate Counting for People with Diabetes" handout from the Academy of Nutrition and Dietetics. Discussed different food groups and their effects on blood sugar, emphasizing carbohydrate-containing foods. Provided list of carbohydrates and recommended serving sizes of common foods.  Discussed importance of controlled and consistent carbohydrate intake throughout the day. Provided examples of ways to balance meals/snacks and encouraged intake of high-fiber, whole grain complex carbohydrates. Teach back method used.  Expect fair compliance.  Body mass index is 38.34 kg/(m^2). Pt meets criteria for obesity, class 2 based on current BMI.  Current diet order is NPO for cardiac cath today. Labs and medications reviewed. No further nutrition interventions warranted at this time. RD contact information provided. If additional nutrition issues arise, please re-consult RD.  Molli Barrows, RD, LDN, [place] Pager [phone] After Hours Pager [phone]

## 2015-02-17 NOTE — Progress Notes (Signed)
Consult Note:   Spoke with pt about new diabetes diagnosis. Discussed A1C results (6.5% on 02/16/15) and explained what an A1C is, basic pathophysiology of DM Type 2, basic home care, importance of checking CBGs and maintaining good CBG control to prevent long-term and short-term complications. Patient said he had an A1c checked at work a year ago and was told to watch his glucose levels. He has family members who have DM and have meters. He has been checking his glucose levels every morning and in the afternoon for over a year. Patient mentions eating a lot of potatoes and fruits on his vegetarian diet and eats meat once a week. Discussed carbohydrates, carbohydrate goals per day and meal, along with portion sizes. Reviewed signs and symptoms of hyperglycemia and hypoglycemia along with treatment for both. RNs to provide ongoing basic DM education at bedside with this patient. Have ordered educational booklet and DM videos. Patient concerned about DM diagnosis showing up after discharge. He is planning on retiring in the next 5 years and says it will not look good on his insurance.  Thanks,  Christena DeemShannon Ulice Follett RN, MSN, [facility] Inpatient Diabetes Coordinator Team Pager [phone]

## 2015-02-17 NOTE — Progress Notes (Addendum)
    No obstructive CAD (diag dz, LAD 40, 30% RCA) NSR - converted  Will discuss DC home Start Flecanide 100 BID (Dr. Johney FrameAllred and I discussed) Xarelto 20mg  to start tomorrow since he just had cath. Complete anticoagulation for at least  for 4 weeks.  Appreciate EP consult, Dr. Johney FrameAllred.    Donato SchultzSKAINS, Rashan Rounsaville, MD

## 2015-02-17 NOTE — CV Procedure (Signed)
      Cardiac Catheterization Operative Report  Christopher ChardBarry Phillips [id number] 4/28/20162:25 PM PROVIDER NOT IN SYSTEM  Procedure Performed:  1. Left Heart Catheterization 2. Selective Coronary Angiography 3. Left ventricular angiogram  Operator: Verne Carrowhristopher McAlhany, MD  Arterial access site:  Right radial artery.   Indication:  54 yo male with history of DM, HTN, HLD, PAF admitted after he converted to rapid atrial fib during GXT with ST depression noted. Cath to exclude CAD.                                      Procedure Details: The risks, benefits, complications, treatment options, and expected outcomes were discussed with the patient. The patient and/or family concurred with the proposed plan, giving informed consent. The patient was brought to the cath lab after IV hydration was begun and oral premedication was given. The patient was further sedated with Versed and Fentanyl. The right wrist was assessed with a modified Allens test which was positive. The right wrist was prepped and draped in a sterile fashion. 1% lidocaine was used for local anesthesia. Using the modified Seldinger access technique, a 5 French sheath was placed in the right radial artery. 3 mg Verapamil was given through the sheath. 6000 units IV heparin was given. Standard diagnostic catheters were used to perform selective coronary angiography. A pigtail catheter was used to perform a left ventricular angiogram. The sheath was removed from the right radial artery and a Terumo hemostasis band was applied at the arteriotomy site on the right wrist.   There were no immediate complications. The patient was taken to the recovery area in stable condition.   Hemodynamic Findings: Central aortic pressure: 118/74 Left ventricular pressure: 135/3/16  Angiographic Findings:  Left main: No obstructive disease  Left Anterior Descending Artery: Large caliber vessel that courses to the apex. The proximal vessel has mild plaque.  The mid vessel has a tubular 40% stenosis after the takeoff of the first diagonal branch. The distal LAD has mild diffuse plaque. The first diagonal branch is a moderate caliber vessel with mild plaque. The second diagonal branch is a small caliber vessel with ostial 70% stenosis.   Circumflex Artery: Large caliber vessel with diffuse 20% stenosis in the proximal and mid vessel. The first obtuse marginal branch is a moderate caliber vessel with mild plaque. The second obtuse marginal branch is a moderate caliber vessel with mild plaque disease.   Right Coronary Artery: Large dominant vessel with proximal 30% stenosis. The mid and distal vessel has mild plaque. The PDA is a moderate caliber vessel with diffuse 30% stenosis.   Left Ventricular Angiogram: LVEF=60-65%.   Impression: 1. Non-obstructive coronary artery disease.  2. Normal LV systolic function  Recommendations: Medical management of CAD. Aggressive risk factor modification. Further treatment of his atrial fibrillation and planning for medical therapy/long term anti-coagulation per the rounding team.        Complications:  None. The patient tolerated the procedure well.

## 2015-02-17 NOTE — Progress Notes (Addendum)
 Patient Name: Christopher Phillips Date of Encounter: 02/17/2015   SUBJECTIVE  Denies chest pain or SOB. At times, experience palpation. He stated he remains in A.fib for approximally 2 days and then spontaneously converts to SR.  CURRENT MEDS . allopurinol  300 mg Oral Daily  . [START ON 02/18/2015] aspirin  81 mg Oral Daily  . atorvastatin  80 mg Oral q1800  . insulin aspart  0-9 Units Subcutaneous TID WC  . living well with diabetes book   Does not apply Once  . metoprolol succinate  50 mg Oral Daily    OBJECTIVE  Filed Vitals:   02/17/15 0042 02/17/15 0350 02/17/15 0404 02/17/15 0745  BP: 107/59 124/83  119/75  Pulse: 78     Temp: 98 F (36.7 C) 98.2 F (36.8 C)  97.8 F (36.6 C)  TempSrc: Oral Oral  Oral  Resp:  20  20  Height:      Weight:   290 lb 8 oz (131.77 kg)   SpO2: 98% 96%  100%    Intake/Output Summary (Last 24 hours) at 02/17/15 0950 Last data filed at 02/17/15 0347  Gross per 24 hour  Intake    200 ml  Output    400 ml  Net   -200 ml   Filed Weights   02/16/15 1501 02/17/15 0404  Weight: 294 lb 12.1 oz (133.7 kg) 290 lb 8 oz (131.77 kg)    PHYSICAL EXAM  General: Pleasant, NAD. Laying in bed.  Neuro: Alert and oriented X 3. Moves all extremities spontaneously. Psych: Normal affect. HEENT:  Normal  Neck: Supple without bruits or JVD. Lungs:  Resp regular and unlabored, CTA. Heart: irregularly irregular,  no s3, s4, or murmurs. Abdomen: Soft, non-tender, non-distended, BS + x 4.  Extremities: No clubbing, cyanosis or edema. DP/PT/Radials 2+ and equal bilaterally.  Accessory Clinical Findings  CBC  Recent Labs  02/16/15 1210 02/17/15 0500  WBC 5.9 7.2  NEUTROABS 3.4  --   HGB 16.1 17.3*  HCT 45.7 50.1  MCV 89.3 90.3  PLT 152 159   Basic Metabolic Panel  Recent Labs  02/16/15 1210 02/17/15 0500  NA 138 138  K 3.8 3.7  CL 105 103  CO2 22 25  GLUCOSE 151* 131*  BUN 10 7  CREATININE 0.95 0.87  CALCIUM 9.4 9.2   Liver Function  Tests  Recent Labs  02/16/15 1210  AST 30  ALT 31  ALKPHOS 54  BILITOT 0.8  PROT 7.3  ALBUMIN 4.4   Cardiac Enzymes  Recent Labs  02/16/15 1603 02/16/15 2149 02/17/15 0500  TROPONINI <0.03 <0.03 <0.03   Hemoglobin A1C  Recent Labs  02/16/15 1603  HGBA1C 6.5*   Fasting Lipid Panel  Recent Labs  02/17/15 0500  CHOL 231*  HDL 33*  LDLCALC 155*  TRIG 215*  CHOLHDL 7.0   Thyroid Function Tests  Recent Labs  02/16/15 1603  TSH 1.879    TELE  A.fin. Rates in 70s to 130s.    ASSESSMENT AND PLAN Active Problems:   Morbid obesity   A-fib   Abnormal stress electrocardiogram test using treadmill   Atrial fibrillation with RVR   Atrial fibrillation with rapid ventricular response   Diabetes mellitus     Christopher Phillips is a 54 y.o. male with a history of HTN, HLD, PAF on Flecainide, low normal EF 50-55% and severe OSA on CPAP who presented to [facility] for an outpatient Flecainide GXT. During his GXT he went into afib   with RVR (HRs 200s) and he was sent to the ED for direct admission.   1. Afib with RVR- He walked 8 minutes 51 seconds on Bruce protocol and the test was terminated as he had reached his target HR of 140 bpm. Just as we were slowing the treadmill down his HR jumped into the 200s and he converted in to afib with RVR. He did feel palpitations and SOB. He slowed down into 150s during recovery but remained in afib. ECG also remarkable for significant ST depressions in inferolateral leads.  -- CHADSVASC score 1 (HTN)-  placed on IV heparin. He does not need long term anticoagulation unless he has CAD on cath then that would increased score to 2. -- Pt was placed on Cardizem gtt, HR dropped to 40s ~11pm last night. Stopped Cardizem gtt. HR remained below 80, however at times jumped over 100s. Continue to monitor. Continue to hold dilt and restart home does of Toprol -XL 50mg. MD advise on home med Lisinopril/HCTZ 20/25mg.  -- Continue to hold flecainide. If cath  shows no obstructive disease then increased flecainide to 100mg BID. If he fails outpatient flecainide, then EP consult.   2. Abnormal GXT- -- He had significant ST depression in inferolateral leads and schedule for heart cath, Dr McAlhany  today @ 1330 despite recent low risk Myoview. Especially in the setting of Flecainide therapy.  -- He is NPO, Lytes normal, TSH normal, Creatinine normal, trop x3 negative.  --02/17/2015: Cholesterol, Total 231*; HDL-C 33*; LDL (calc) 155*; Triglycerides 215*; VLDL 43* - Will add high dose statin.  LFT was normal 4/27.   3. OSA- continue CPAP  4. DM- HgbA1c 6.5. New diagnosis. Add sliding scale insulin and diabetic education. f/u with PCP.  Signed, Bhagat,Bhavinkumar PA-C Pager [phone]  Personally seen and examined. Agree with above. AFIB PAROX Cath today - discussed with Dr. Turner ST depression noted on treadmill If CAD - no flecainide, plan on EP eval for ablation Will need to restart DOAC post cath Restarting metoprolol XL 50  SKAINS, MARK, MD   

## 2015-02-17 NOTE — Consult Note (Addendum)
ELECTROPHYSIOLOGY CONSULT NOTE    Primary Care Physician: PROVIDER NOT IN SYSTEM Referring Physician:  Dr Anne Fu  Admit Date: 02/16/2015  Reason for consultation:  afib  Christopher Phillips is a 54 y.o. male with a h/o paroxysmal atrial fibrillation, obesity, and severe OSA who presents with afib.   He presented to [facility] for an outpatient Flecainide GXT. During his GXT he went into afib with RVR (HRs 200s) and he was sent to the ED for direct admission.  He had recently started flecainide  BID.  His afib was better with this regimen.  He continues to have symptoms of palpitations however.  He reports associated SOB.  He underwent cath today which showed nonobstructive CAD and preserved EF.  Today, he denies symptoms of chest pain, orthopnea, PND, lower extremity edema, dizziness, presyncope, syncope, or neurologic sequela. The patient is tolerating medications without difficulties and is otherwise without complaint today.   Past Medical History  Diagnosis Date  . Hypertension   . Hyperlipidemia   . Complication of anesthesia     slow to wake up  . History of kidney stones   . OSA on CPAP     severe with AHI 24.84/hr now on CPAP at 13cm H2O  . PAF (paroxysmal atrial fibrillation)     CHADS2VASC is 1 (HTN)  . Diabetes mellitus   . CAD (coronary artery disease)     nonobstructive   Past Surgical History  Procedure Laterality Date  . Knee surgery      BILATERAL  . Kidney stone surgery      20 YRS AGO  . Nephrolithotomy Right 02/08/2014    Procedure: NEPHROLITHOTOMY PERCUTANEOUS;  Surgeon: Milford Cage, MD;  Location: [facility];  Service: Urology;  Laterality: Right;  1ST STAGE (RT) PCNL      . Cystoscopy with ureteroscopy Right 02/08/2014    Procedure: Cystoscopy Left retrograde pyleogram, right antegrade pyleogram;  Surgeon: Milford Cage, MD;  Location: [facility];  Service: Urology;  Laterality: Right;  . Nephrolithotomy Right 02/10/2014    Procedure: RIGHT  NEPHROLITHOTOMY PERCUTANEOUS SECOND LOOK;  Surgeon: Milford Cage, MD;  Location: [facility];  Service: Urology;  Laterality: Right;  2ND STAGE (RT) PCNL     . allopurinol  300 mg Oral Daily  . [START ON 02/18/2015] aspirin  81 mg Oral Daily  . atorvastatin  80 mg Oral q1800  . insulin aspart  0-9 Units Subcutaneous TID WC  . metoprolol succinate  50 mg Oral Daily  . potassium chloride  40 mEq Oral STAT   . sodium chloride 75 mL/hr at 02/17/15 1536    No Known Allergies  History   Social History  . Marital Status: Married    Spouse Name: N/A  . Number of Children: N/A  . Years of Education: N/A   Occupational History  . Not on file.   Social History Main Topics  . Smoking status: Former Smoker    Quit date: 01/29/1993  . Smokeless tobacco: Never Used  . Alcohol Use: No  . Drug Use: No  . Sexual Activity: Yes   Other Topics Concern  . Not on file   Social History Narrative    Family History  Problem Relation Age of Onset  . Heart failure Mother 60  . Heart failure Father 70    ROS- All systems are reviewed and negative except as per the HPI above  Physical Exam: Telemetry: afib with RVR, now in sinus rhythm Filed Vitals:   02/17/15  1535 02/17/15 1550 02/17/15 1609 02/17/15 1620  BP: 115/70 116/73 119/68 115/79  Pulse: 58 65 62 59  Temp:   97.7 F (36.5 C)   TempSrc:   Oral   Resp:      Height:      Weight:      SpO2: 96% 98% 98% 98%    GEN- The patient is overweight appearing, alert and oriented x 3 today.   Head- normocephalic, atraumatic Eyes-  Sclera clear, conjunctiva pink Ears- hearing intact Oropharynx- clear Neck- supple,   Lungs- Clear to ausculation bilaterally, normal work of breathing Heart- Regular rate and rhythm, no murmurs, rubs or gallops, PMI not laterally displaced GI- soft, NT, ND, + BS Extremities- no clubbing, cyanosis, or edema MS- no significant deformity or atrophy Skin- no rash or lesion Psych- euthymic mood, full  affect Neuro- strength and sensation are intact  EKG reveals sinus rhythm, Qtc 430 msec Echo reveals preserve EF. LA 50 mm  Labs:   Lab Results  Component Value Date   WBC 7.2 02/17/2015   HGB 17.3* 02/17/2015   HCT 50.1 02/17/2015   MCV 90.3 02/17/2015   PLT 159 02/17/2015    Recent Labs Lab 02/16/15 1210 02/17/15 0500  NA 138 138  K 3.8 3.7  CL 105 103  CO2 22 25  BUN 10 7  CREATININE 0.95 0.87  CALCIUM 9.4 9.2  PROT 7.3  --   BILITOT 0.8  --   ALKPHOS 54  --   ALT 31  --   AST 30  --   GLUCOSE 151* 131*   Lab Results  Component Value Date   TROPONINI <0.03 02/17/2015    Lab Results  Component Value Date   CHOL 231* 02/17/2015   Lab Results  Component Value Date   HDL 33* 02/17/2015   Lab Results  Component Value Date   LDLCALC 155* 02/17/2015   Lab Results  Component Value Date   TRIG 215* 02/17/2015   Lab Results  Component Value Date   CHOLHDL 7.0 02/17/2015   No results found for: LDLDIRECT    ASSESSMENT AND PLAN: 1. Paroxysmal atrial fibrillation The patient has symptomatic recurrent afib. His afib has been difficult to control.  He has tolerated flecainide 50mg  BID without difficulty but has had additional afib.  I worry that with his obesity, severe OSA, and moderate to severe LA enlargement that our ability to maintain sinus rhythm long term is reduced.  I spoke at length with the patient regarding the importance of lifestyle modification including compliance with CPAP, weight reduction, and regular exercise (CARDIOFIT and ARREST AF data).   At this time, I would favor increasing flecainide to 100mg  BID.  He does not have obstructive CAD and therefore I think that this is a reasonable option.  Chads2vasc score is 1-2.  Continue ASA at this time.  Continue to titrate metoprolol as tolerated. Could consider tikosyn as our next option should he fail medical therapy with flecainide.  I think that before we consider ablation, aggressive attempts at  lifestyle modification have to be made.  2. OSA As above  3. Morbid obesity Body mass index is 38.34 kg/(m^2). As above  4. Nonobstructive CAD ASA, beta blocker, statin  5. HTN Stable No change required today  He has difficult to control afib and is at risk for recurrent hospitalization/ decompensation.  A high level of decision making was required for this consult today.  OK to discharge from EP standpoint.  Follow-up  with Rudi Coco NP in the AF clinic in 1-2 weeks.  Hillis Range, MD 02/17/2015  5:11 PM

## 2015-02-17 NOTE — Progress Notes (Signed)
UR completed 

## 2015-02-17 NOTE — H&P (View-Only) (Signed)
Patient Name: Christopher Phillips Date of Encounter: 02/17/2015   SUBJECTIVE  Denies chest pain or SOB. At times, experience palpation. He stated he remains in A.fib for approximally 2 days and then spontaneously converts to SR.  CURRENT MEDS . allopurinol  300 mg Oral Daily  . [START ON 02/18/2015] aspirin  81 mg Oral Daily  . atorvastatin  80 mg Oral q1800  . insulin aspart  0-9 Units Subcutaneous TID WC  . living well with diabetes book   Does not apply Once  . metoprolol succinate  50 mg Oral Daily    OBJECTIVE  Filed Vitals:   02/17/15 0042 02/17/15 0350 02/17/15 0404 02/17/15 0745  BP: 107/59 124/83  119/75  Pulse: 78     Temp: 98 F (36.7 C) 98.2 F (36.8 C)  97.8 F (36.6 C)  TempSrc: Oral Oral  Oral  Resp:  20  20  Height:      Weight:   290 lb 8 oz (131.77 kg)   SpO2: 98% 96%  100%    Intake/Output Summary (Last 24 hours) at 02/17/15 0950 Last data filed at 02/17/15 0347  Gross per 24 hour  Intake    200 ml  Output    400 ml  Net   -200 ml   Filed Weights   02/16/15 1501 02/17/15 0404  Weight: 294 lb 12.1 oz (133.7 kg) 290 lb 8 oz (131.77 kg)    PHYSICAL EXAM  General: Pleasant, NAD. Laying in bed.  Neuro: Alert and oriented X 3. Moves all extremities spontaneously. Psych: Normal affect. HEENT:  Normal  Neck: Supple without bruits or JVD. Lungs:  Resp regular and unlabored, CTA. Heart: irregularly irregular,  no s3, s4, or murmurs. Abdomen: Soft, non-tender, non-distended, BS + x 4.  Extremities: No clubbing, cyanosis or edema. DP/PT/Radials 2+ and equal bilaterally.  Accessory Clinical Findings  CBC  Recent Labs  02/16/15 1210 02/17/15 0500  WBC 5.9 7.2  NEUTROABS 3.4  --   HGB 16.1 17.3*  HCT 45.7 50.1  MCV 89.3 90.3  PLT 152 159   Basic Metabolic Panel  Recent Labs  02/16/15 1210 02/17/15 0500  NA 138 138  K 3.8 3.7  CL 105 103  CO2 22 25  GLUCOSE 151* 131*  BUN 10 7  CREATININE 0.95 0.87  CALCIUM 9.4 9.2   Liver Function  Tests  Recent Labs  02/16/15 1210  AST 30  ALT 31  ALKPHOS 54  BILITOT 0.8  PROT 7.3  ALBUMIN 4.4   Cardiac Enzymes  Recent Labs  02/16/15 1603 02/16/15 2149 02/17/15 0500  TROPONINI <0.03 <0.03 <0.03   Hemoglobin A1C  Recent Labs  02/16/15 1603  HGBA1C 6.5*   Fasting Lipid Panel  Recent Labs  02/17/15 0500  CHOL 231*  HDL 33*  LDLCALC 155*  TRIG 215*  CHOLHDL 7.0   Thyroid Function Tests  Recent Labs  02/16/15 1603  TSH 1.879    TELE  A.fin. Rates in 70s to 130s.    ASSESSMENT AND PLAN Active Problems:   Morbid obesity   A-fib   Abnormal stress electrocardiogram test using treadmill   Atrial fibrillation with RVR   Atrial fibrillation with rapid ventricular response   Diabetes mellitus     Christopher ChardBarry Bellemare is a 54 y.o. male with a history of HTN, HLD, PAF on Flecainide, low normal EF 50-55% and severe OSA on CPAP who [facility] Fle[facility]ainide G[facility]. During his GXT he went into afib  with RVR (HRs 200s) and he was sent to the ED for direct admission.   1. Afib with RVR- He walked 8 minutes 51 seconds on Bruce protocol and the test was terminated as he had reached his target HR of 140 bpm. Just as we were slowing the treadmill down his HR jumped into the 200s and he converted in to afib with RVR. He did feel palpitations and SOB. He slowed down into 150s during recovery but remained in afib. ECG also remarkable for significant ST depressions in inferolateral leads.  -- CHADSVASC score 1 (HTN)-  placed on IV heparin. He does not need long term anticoagulation unless he has CAD on cath then that would increased score to 2. -- Pt was placed on Cardizem gtt, HR dropped to 40s ~11pm last night. Stopped Cardizem gtt. HR remained below 80, however at times jumped over 100s. Continue to monitor. Continue to hold dilt and restart home does of Toprol -XL . MD advise on home med Lisinopril/HCTZ 20/25mg .  -- Continue to hold flecainide. If cath  shows no obstructive disease then increased flecainide to  BID. If he fails outpatient flecainide, then EP consult.   2. Abnormal GXT- -- He had significant ST depression in inferolateral leads and schedule for heart cath, Dr Clifton James  today @ 1330 despite recent low risk Myoview. Especially in the setting of Flecainide therapy.  -- He is NPO, Lytes normal, TSH normal, Creatinine normal, trop x3 negative.  --02/17/2015: Cholesterol, Total 231*; HDL-C 33*; LDL (calc) 155*; Triglycerides 215*; VLDL 43* - Will add high dose statin.  LFT was normal 4/27.   3. OSA- continue CPAP  4. DM- HgbA1c 6.5. New diagnosis. Add sliding scale insulin and diabetic education. f/u with PCP.  Signed, Manson Passey PA-C Pager [phone]  Personally seen and examined. Agree with above. AFIB PAROX Cath today - discussed with Dr. Mayford Knife ST depression noted on treadmill If CAD - no flecainide, plan on EP eval for ablation Will need to restart DOAC post cath Restarting metoprolol XL 50  SKAINS, MARK, MD

## 2015-02-21 ENCOUNTER — Telehealth: Payer: Self-pay | Admitting: Physician Assistant

## 2015-02-21 NOTE — Telephone Encounter (Signed)
Note opened in error.

## 2015-02-21 NOTE — Discharge Summary (Signed)
Please see progress note dated 02/17/15 for discharge summary. This was accidentally listed as a progress note.

## 2015-03-02 ENCOUNTER — Encounter (HOSPITAL_COMMUNITY): Payer: Self-pay | Admitting: Nurse Practitioner

## 2015-03-02 ENCOUNTER — Ambulatory Visit (HOSPITAL_COMMUNITY)
Admission: RE | Admit: 2015-03-02 | Discharge: 2015-03-02 | Disposition: A | Discharge status: Home / Self Care | Payer: BLUE CROSS/BLUE SHIELD | Source: Ambulatory Visit | Attending: Nurse Practitioner | Admitting: Nurse Practitioner

## 2015-03-02 VITALS — BP 130/82 | HR 62 | Ht 73.0 in | Wt 294.2 lb

## 2015-03-02 DIAGNOSIS — I2510 Atherosclerotic heart disease of native coronary artery without angina pectoris: Secondary | ICD-10-CM | POA: Diagnosis not present

## 2015-03-02 DIAGNOSIS — G4733 Obstructive sleep apnea (adult) (pediatric): Secondary | ICD-10-CM | POA: Diagnosis not present

## 2015-03-02 DIAGNOSIS — I10 Essential (primary) hypertension: Secondary | ICD-10-CM | POA: Diagnosis not present

## 2015-03-02 DIAGNOSIS — I480 Paroxysmal atrial fibrillation: Secondary | ICD-10-CM | POA: Insufficient documentation

## 2015-03-02 NOTE — Progress Notes (Signed)
Patient ID: Christopher Phillips, male   DOB: 05/09/1961, 54 y.o.   MRN: [id number]     Primary Cardiologist: Dr. Mayford Knifeurner Consulting EP: Dr. Berneda RoseAllred    Christopher Phillips is a 54 y.o. male with a h/o PAF,obesity, OSA on CPAP that recently had PAF with RVR(HR 200) while undergoing an outpatient GXT for f/u flecainide therapy. He was sent to the ER for direct admission.He also underwent LHC and had non obstrucutive disease. He converted to SR. Flecainide was increased to 100 mg bid per advise of Dr. Johney FrameAllred.He was started on Xarelto 20 mg qd.  Since d/c he had around 12 hours of afib last Monday pm. Long discussion re lifestyle changes to reduce afib burden. Pt has lost 12 lbs recently and aspires to lose much more. No regular exercise program but walks around 12 miles a day in his job. Is being complaint with CPAP. No alcohol/tobacco use.  Today, he denies symptoms of palpitations, chest pain, shortness of breath, orthopnea, PND, lower extremity edema, dizziness, presyncope, syncope, or neurologic sequela. The patient is tolerating medications without difficulties and is otherwise without complaint today.   Past Medical History  Diagnosis Date  . Hypertension   . Hyperlipidemia   . Complication of anesthesia     slow to wake up  . History of kidney stones   . OSA on CPAP     severe with AHI 24.84/hr now on CPAP at 13cm H2O  . PAF (paroxysmal atrial fibrillation)     a. Started on flecainide - developed AF RVR with GXT. Increased to 100mg  BID, started on Xarelto. CHADSVASC = 3.  . Diabetes mellitus     a. A1c 6.5 in 01/2015.  [name]. CAD (coronary artery disease)     a. nonobstructive by cath 01/2015.  . Morbid obesity    Past Surgical History  Procedure Laterality Date  . Knee surgery      BILATERAL  . Kidney stone surgery      20 YRS AGO  . Nephrolithotomy Right 02/08/2014    Procedure: NEPHROLITHOTOMY PERCUTANEOUS;  Surgeon: Milford Cageaniel Young Woodruff, MD;  Location: [facility];  Service: Urology;  Laterality:  Right;  1ST STAGE (RT) PCNL      . Cystoscopy with ureteroscopy Right 02/08/2014    Procedure: Cystoscopy Left retrograde pyleogram, right antegrade pyleogram;  Surgeon: Milford Cageaniel Young Woodruff, MD;  Location: [facility];  Service: Urology;  Laterality: Right;  . Nephrolithotomy Right 02/10/2014    Procedure: RIGHT NEPHROLITHOTOMY PERCUTANEOUS SECOND LOOK;  Surgeon: Milford Cageaniel Young Woodruff, MD;  Location: [facility];  Service: Urology;  Laterality: Right;  2ND STAGE (RT) PCNL   . Left heart catheterization with coronary angiogram N/A 02/17/2015    Procedure: LEFT HEART CATHETERIZATION WITH CORONARY ANGIOGRAM;  Surgeon: Kathleene Hazelhristopher D McAlhany, MD;  [facility];  Service: Cardiovascular;  Laterality: N/A;    Current Outpatient Prescriptions  Medication Sig Dispense Refill  . allopurinol (ZYLOPRIM) 300 MG tablet Take 300 mg by mouth daily.    [name]. atorvastatin (LIPITOR) 80 MG tablet Take 1 tablet (80 mg total) by mouth every evening. 30 tablet 6  . Flaxseed, Linseed, (FLAXSEED OIL) 1200 MG CAPS Take 1,200 mg by mouth 2 (two) times daily.    . flecainide (TAMBOCOR) 100 MG tablet Take 1 tablet (100 mg total) by mouth 2 (two) times daily. 60 tablet 2  . lisinopril-hydrochlorothiazide (PRINZIDE,ZESTORETIC) 20-25 MG per tablet Take 1 tablet by mouth every morning.    [name]. MAGNESIUM PO Take 1 tablet by mouth  daily.    . metoprolol succinate (TOPROL-XL) 50 MG 24 hr tablet Take 1 tablet (50 mg total) by mouth daily. 90 tablet 3  . Omega-3 Fatty Acids (OMEGA-3 FISH OIL) 1200 MG CAPS Take by mouth 2 (two) times daily.    . Red Yeast Rice 600 MG CAPS Take 600 mg by mouth 2 (two) times daily.    . rivaroxaban (XARELTO) 20 MG TABS tablet Take 1 tablet (20 mg total) by mouth daily with supper. 30 tablet 3   No current facility-administered medications for this encounter.    No Known Allergies  History   Social History  . Marital Status: Married    Spouse Name: N/A  . Number of Children: N/A  . Years of  Education: N/A   Occupational History  . Not on file.   Social History Main Topics  . Smoking status: Former Smoker    Quit date: 01/29/1993  . Smokeless tobacco: Never Used  . Alcohol Use: No  . Drug Use: No  . Sexual Activity: Yes   Other Topics Concern  . Not on file   Social History Narrative    Family History  Problem Relation Age of Onset  . Heart failure Mother [id number]  . Heart failure Father 70    ROS- All systems are reviewed and negative except as per the HPI above  Physical Exam: Filed Vitals:   03/02/15 1542  BP: 130/82  Pulse: 62  Height: 6\' 1"  (1.854 m)  Weight: 294 lb 3.2 oz (133.448 kg)    GEN- The patient is well appearing, alert and oriented x 3 today.   Head- normocephalic, atraumatic Eyes-  Sclera clear, conjunctiva pink Ears- hearing intact Oropharynx- clear Neck- supple, no JVP Lymph- no cervical lymphadenopathy Lungs- Clear to ausculation bilaterally, normal work of breathing Heart- Regular rate and rhythm, no murmurs, rubs or gallops, PMI not laterally displaced GI- soft, NT, ND, + BS Extremities- no clubbing, cyanosis, or edema MS- no significant deformity or atrophy Skin- no rash or lesion Psych- euthymic mood, full affect Neuro- strength and sensation are intact  EKG-Sinus rhythm, normal EKG, PR int 201 ms, QRS 90 ms, QTc 446 ms.  Echo  01/31/15- Left ventricle: The cavity size was normal. Wall thickness was normal. Systolic function was normal. The estimated ejection fraction was in the range of 55% to 60%. Wall motion was normal; there were no regional wall motion abnormalities. - Left atrium: The atrium was mildly to moderately dilated. - Right ventricle: The cavity size was mildly dilated. - Right atrium: The atrium was mildly dilated.  Impressions:  - Technically difficult; definity used; normal LV function; biatrial enlargement; mild RVE; trace TR.  Epic records reviewed in detail.   Assessment and Plan: 1.  PAF  Symptomatic recurrent afib, even since increase in flecainide. Afib is proving to be difficult to control.With obesity, OSA, mod to severe LA enlargement  ability to maintain SR long term is reduced. Importance of lifestyle changes discussed in detail. Chads  vasc score is 1-2. Continue xarelto. Tikosyn may be the next option, if fails flecainide, with ablation reserved until aggressive attempts at lifestyle modification have been made.  2. OSA Compliance with CPAP is a must.  3, Non obstructive CAD BB, Statin  4. HTN  Stable  F/u with Dr. Mayford Knifeurner as scheduled 6/7.

## 2015-03-03 ENCOUNTER — Ambulatory Visit: Payer: BLUE CROSS/BLUE SHIELD | Admitting: Physician Assistant

## 2015-03-14 ENCOUNTER — Encounter: Payer: Self-pay | Admitting: Cardiology

## 2015-03-23 ENCOUNTER — Telehealth: Payer: Self-pay

## 2015-03-23 ENCOUNTER — Telehealth: Payer: Self-pay | Admitting: Cardiology

## 2015-03-23 NOTE — Telephone Encounter (Signed)
Spoke with pt and informed him that he needed to stay on the Xarelto. Pt verbalized understanding and was in agreement with this plan.

## 2015-03-23 NOTE — Telephone Encounter (Signed)
Prior auth obtained for Xarelto 20mg  daily thru [facility]. Notification sent to pharmacy.

## 2015-03-23 NOTE — Telephone Encounter (Signed)
New message      Called to resc appt because Dr Mayford Knifeurner is out of office.  He resc on 05-12-15.  Pt is out of xarelto and want to know if he can take an aspirin and not refill xarelto?

## 2015-03-23 NOTE — Telephone Encounter (Signed)
He needs to stay on Xarelto

## 2015-03-24 ENCOUNTER — Ambulatory Visit: Payer: BLUE CROSS/BLUE SHIELD | Admitting: Cardiology

## 2015-03-29 ENCOUNTER — Ambulatory Visit: Payer: BLUE CROSS/BLUE SHIELD | Admitting: Cardiology

## 2015-05-11 NOTE — Progress Notes (Signed)
Cardiology Office Note   Date:  05/12/2015   ID:  Christopher Phillips, DOB 03/24/1961, MRN [id number]  PCP:  PROVIDER NOT IN SYSTEM    Chief Complaint  Patient presents with  . Follow-up    OSA      History of Present Illness: This is a 54yo male with history fo PAF, low normal LVF EF 50-55% and severe OSA on CPAP. He now presents for followup. He uses a nasal pillow mask which he tolerates well and tolerates the pressure. He says that he wakes up every hour to look at the clock and then goes back to sleep. He feels rested in the am and has no daytime sleepiness.  He denies any chest pain, SOB, DOE, dizziness, or syncope. Since increasing the metoprolol he has not had any further palpitations. He has started back walking 3-5 miles 3 times weekly.      Past Medical History  Diagnosis Date  . Hypertension   . Hyperlipidemia   . Complication of anesthesia     slow to wake up  . History of kidney stones   . OSA on CPAP     severe with AHI 24.84/hr now on CPAP at 13cm H2O  . PAF (paroxysmal atrial fibrillation)     a. Started on flecainide - developed AF RVR with GXT. Increased to 100mg  BID, started on Xarelto. CHADSVASC = 2  (HTN and nonobstructive CAD)  . Diabetes mellitus     a. A1c 6.5 in 01/2015.  [name]. CAD (coronary artery disease)     a. nonobstructive by cath 01/2015.  . Morbid obesity     Past Surgical History  Procedure Laterality Date  . Knee surgery      BILATERAL  . Kidney stone surgery      20 YRS AGO  . Nephrolithotomy Right 02/08/2014    Procedure: NEPHROLITHOTOMY PERCUTANEOUS;  Surgeon: Milford Cageaniel Young Woodruff, MD;  Location: [facility];  Service: Urology;  Laterality: Right;  1ST STAGE (RT) PCNL      . Cystoscopy with ureteroscopy Right 02/08/2014    Procedure: Cystoscopy Left retrograde pyleogram, right antegrade pyleogram;  Surgeon: Milford Cageaniel Young Woodruff, MD;  Location: [facility];  Service: Urology;  Laterality: Right;  . Nephrolithotomy  Right 02/10/2014    Procedure: RIGHT NEPHROLITHOTOMY PERCUTANEOUS SECOND LOOK;  Surgeon: Milford Cageaniel Young Woodruff, MD;  Location: [facility];  Service: Urology;  Laterality: Right;  2ND STAGE (RT) PCNL   . Left heart catheterization with coronary angiogram N/A 02/17/2015    Procedure: LEFT HEART CATHETERIZATION WITH CORONARY ANGIOGRAM;  Surgeon: Kathleene Hazelhristopher D McAlhany, MD;  [facility];  Service: Cardiovascular;  Laterality: N/A;     Current Outpatient Prescriptions  Medication Sig Dispense Refill  . allopurinol (ZYLOPRIM) 300 MG tablet Take 300 mg by mouth daily.    [name]. atorvastatin (LIPITOR) 80 MG tablet Take 1 tablet (80 mg total) by mouth every evening. 30 tablet 6  . Flaxseed, Linseed, (FLAXSEED OIL) 1200 MG CAPS Take 1,200 mg by mouth 2 (two) times daily.    . flecainide (TAMBOCOR) 100 MG tablet Take 1 tablet (100 mg total) by mouth 2 (two) times daily. 60 tablet 2  . lisinopril-hydrochlorothiazide (PRINZIDE,ZESTORETIC) 20-25 MG per tablet Take 1 tablet by mouth every morning.    [name]. MAGNESIUM PO Take 1 tablet by mouth daily.    . metoprolol succinate (TOPROL-XL) 50 MG 24 hr tablet Take 1 tablet (  50 mg total) by mouth daily. 90 tablet 3  . Omega-3 Fatty Acids (OMEGA-3 FISH OIL) 1200 MG CAPS Take by mouth 2 (two) times daily.    . Red Yeast Rice 600 MG CAPS Take 600 mg by mouth 2 (two) times daily.    . rivaroxaban (XARELTO) 20 MG TABS tablet Take 1 tablet (20 mg total) by mouth daily with supper. 30 tablet 3   No current facility-administered medications for this visit.    Allergies:   Review of patient's allergies indicates no known allergies.    Social History:  The patient  reports that he quit smoking about 22 years ago. He has never used smokeless tobacco. He reports that he does not drink alcohol or use illicit drugs.   Family History:  The patient's family history includes Heart failure (age of onset: 28) in his father and mother.    ROS:  Please see the history of present  illness.   Otherwise, review of systems are positive for none.   All other systems are reviewed and negative.    PHYSICAL EXAM: VS:  BP 118/72 mmHg  Pulse 66  Ht  (1.854 m)  Wt 300 lb 6.4 oz (136.261 kg)  BMI 39.64 kg/m2  SpO2 97% , BMI Body mass index is 39.64 kg/(m^2). GEN: Well nourished, well developed, in no acute distress HEENT: normal Neck: no JVD, carotid bruits, or masses Cardiac: RRR; no murmurs, rubs, or gallops,no edema  Respiratory:  clear to auscultation bilaterally, normal work of breathing GI: soft, nontender, nondistended, + BS MS: no deformity or atrophy Skin: warm and dry, no rash Neuro:  Strength and sensation are intact Psych: euthymic mood, full affect   EKG:  EKG is not ordered today.    Recent Labs: 02/16/2015: ALT 31; B Natriuretic Peptide 51.6; TSH 1.879 02/17/2015: BUN 7; Creatinine, Ser 0.87; Hemoglobin 17.3*; Platelets 159; Potassium 3.7; Sodium 138    Lipid Panel    Component Value Date/Time   CHOL 231* 02/17/2015 0500   TRIG 215* 02/17/2015 0500   HDL 33* 02/17/2015 0500   CHOLHDL 7.0 02/17/2015 0500   VLDL 43* 02/17/2015 0500   LDLCALC 155* 02/17/2015 0500      Wt Readings from Last 3 Encounters:  05/12/15 300 lb 6.4 oz (136.261 kg)  03/02/15 294 lb 3.2 oz (133.448 kg)  02/17/15 290 lb 8 oz (131.77 kg)    ASSESSMENT AND PLAN:  1. Severe OSA now on CPAP at 13cm H2O and tolerating well. His most recent d/l showed an AHI of 0.3/hr on 13cm H2O and 100% compliance in using more than 4 hours nightly. He has had significant improvement in daytime sleepiness and feeling of well being after starting CPAP. He will continue on current meds. 2. PAF maintaining NSR after increasing BB - CHADS2VASC is 1 (HTN) so no systemic anticoagulation indicated. Continue ASA/BB/Flecainide. 3. HTN - controlled.  Continue metoprolol and Prinizide  4. Chest pain in the setting of PAF with normal stress myoview    Current medicines are  reviewed at length with the patient today.  The patient does not have concerns regarding medicines.  The following changes have been made:  no change  Labs/ tests ordered today: See above Assessment and Plan No orders of the defined types were placed in this encounter.     Disposition:   FU with me in 6 months  Signed, Quintella Reichert, MD  05/12/2015 3:23 PM    [facility] [address]  [address], [place], [place]  [address] Phone: [phone]; Fax: [phone]

## 2015-05-12 ENCOUNTER — Encounter: Payer: Self-pay | Admitting: Cardiology

## 2015-05-12 ENCOUNTER — Ambulatory Visit (INDEPENDENT_AMBULATORY_CARE_PROVIDER_SITE_OTHER): Payer: BLUE CROSS/BLUE SHIELD | Admitting: Cardiology

## 2015-05-12 VITALS — BP 118/72 | HR 66 | Ht 73.0 in | Wt 300.4 lb

## 2015-05-12 DIAGNOSIS — I10 Essential (primary) hypertension: Secondary | ICD-10-CM | POA: Diagnosis not present

## 2015-05-12 DIAGNOSIS — I480 Paroxysmal atrial fibrillation: Secondary | ICD-10-CM

## 2015-05-12 DIAGNOSIS — G4733 Obstructive sleep apnea (adult) (pediatric): Secondary | ICD-10-CM

## 2015-05-12 DIAGNOSIS — E785 Hyperlipidemia, unspecified: Secondary | ICD-10-CM | POA: Diagnosis not present

## 2015-05-12 DIAGNOSIS — Z9989 Dependence on other enabling machines and devices: Secondary | ICD-10-CM

## 2015-05-12 NOTE — Patient Instructions (Addendum)
Medication Instructions:  Your physician recommends that you continue on your current medications as directed. Please refer to the Current Medication list given to you today.   Labwork: FASTING LABS IN ONE WEEK.  Testing/Procedures: None  Follow-Up: Your physician recommends that you schedule a follow-up appointment with Dr. Johney Frame.  Your physician wants you to follow-up in: 6 months with Dr. Mayford Knife. You will receive a reminder letter in the mail two months in advance. If you don't receive a letter, please call our office to schedule the follow-up appointment.   Any Other Special Instructions Will Be Listed Below (If Applicable).

## 2015-05-17 ENCOUNTER — Telehealth: Payer: Self-pay | Admitting: Cardiology

## 2015-05-17 NOTE — Telephone Encounter (Signed)
Walk in pt form-pt dropped off labs-gave to [facility]

## 2015-05-18 ENCOUNTER — Other Ambulatory Visit: Payer: Self-pay | Admitting: Physician Assistant

## 2015-05-20 ENCOUNTER — Encounter: Payer: Self-pay | Admitting: Cardiology

## 2015-06-06 ENCOUNTER — Encounter: Payer: Self-pay | Admitting: Internal Medicine

## 2015-06-06 ENCOUNTER — Ambulatory Visit (INDEPENDENT_AMBULATORY_CARE_PROVIDER_SITE_OTHER): Payer: BLUE CROSS/BLUE SHIELD | Admitting: Internal Medicine

## 2015-06-06 VITALS — BP 126/82 | HR 61 | Ht 73.0 in | Wt 303.4 lb

## 2015-06-06 DIAGNOSIS — Z9989 Dependence on other enabling machines and devices: Secondary | ICD-10-CM

## 2015-06-06 DIAGNOSIS — I480 Paroxysmal atrial fibrillation: Secondary | ICD-10-CM | POA: Diagnosis not present

## 2015-06-06 DIAGNOSIS — G4733 Obstructive sleep apnea (adult) (pediatric): Secondary | ICD-10-CM

## 2015-06-06 NOTE — Progress Notes (Signed)
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 male who presents today for routine electrophysiology followup.  Since I saw him in the hospital, the patient reports doing very well.  He continues to have afib about every 2 weeks, lasting up to 12 hours.  His symptoms are currently tolerable. Today, he denies symptoms of chest pain, shortness of breath,  lower extremity edema, dizziness, presyncope, or syncope.  The patient is otherwise without complaint today.   Past Medical History  Diagnosis Date  . Hypertension   . Hyperlipidemia   . Complication of anesthesia     slow to wake up  . History of kidney stones   . OSA on CPAP     severe with AHI 24.84/hr now on CPAP at 13cm H2O  . PAF (paroxysmal atrial fibrillation)     a. Started on flecainide - developed AF RVR with GXT. Increased to  BID, started on Xarelto. CHADSVASC = 2  (HTN and nonobstructive CAD)  . Diabetes mellitus     a. A1c 6.5 in 01/2015.  [name] CAD (coronary artery disease)     a. nonobstructive by cath 01/2015.  . Morbid obesity    Past Surgical History  Procedure Laterality Date  . Knee surgery      BILATERAL  . Kidney stone surgery      20 YRS AGO  . Nephrolithotomy Right 02/08/2014    Procedure: NEPHROLITHOTOMY PERCUTANEOUS;  Surgeon: Milford Cage, MD;  Location: [facility];  Service: Urology;  Laterality: Right;  1ST STAGE (RT) PCNL      . Cystoscopy with ureteroscopy Right 02/08/2014    Procedure: Cystoscopy Left retrograde pyleogram, right antegrade pyleogram;  Surgeon: Milford Cage, MD;  Location: [facility];  Service: Urology;  Laterality: Right;  . Nephrolithotomy Right 02/10/2014    Procedure: RIGHT NEPHROLITHOTOMY PERCUTANEOUS SECOND LOOK;  Surgeon: Milford Cage, MD;  Location: [facility];  Service: Urology;  Laterality: Right;  2ND STAGE (RT) PCNL   . Left heart catheterization with coronary angiogram N/A 02/17/2015    Procedure: LEFT HEART CATHETERIZATION WITH CORONARY  ANGIOGRAM;  Surgeon: Kathleene Hazel, MD;  Location: [facility] C[facility] LAB;  Service: Cardiovascular;  Laterality: N/A;    ROS- all systems are reviewed and negatives except as per HPI above  Current Outpatient Prescriptions  Medication Sig Dispense Refill  . allopurinol (ZYLOPRIM) 300 MG tablet Take 300 mg by mouth daily.    [name] atorvastatin (LIPITOR) 80 MG tablet Take 1 tablet (80 mg total) by mouth every evening. 30 tablet 6  . Flaxseed, Linseed, (FLAXSEED OIL) 1200 MG CAPS Take 1,200 mg by mouth 2 (two) times daily.    . flecainide (TAMBOCOR) 100 MG tablet TAKE ONE TABLET BY MOUTH TWICE DAILY 60 tablet 6  . lisinopril-hydrochlorothiazide (PRINZIDE,ZESTORETIC) 20-25 MG per tablet Take 1 tablet by mouth every morning.    [name] MAGNESIUM PO Take 1 tablet by mouth daily.    . metoprolol succinate (TOPROL-XL) 50 MG 24 hr tablet Take 1 tablet (50 mg total) by mouth daily. 90 tablet 3  . Omega-3 Fatty Acids (OMEGA-3 FISH OIL) 1200 MG CAPS Take by mouth 2 (two) times daily.    . rivaroxaban (XARELTO) 20 MG TABS tablet Take 1 tablet (20 mg total) by mouth daily with supper. 30 tablet 3   No current facility-administered medications for this visit.    Physical Exam: Filed Vitals:   06/06/15 1615  BP: 126/82  Pulse: 61  Height:  (1.854  m)  Weight: 137.621 kg (303 lb 6.4 oz)    GEN- The patient is well appearing, alert and oriented x 3 today.   Head- normocephalic, atraumatic Eyes-  Sclera clear, conjunctiva pink Ears- hearing intact Oropharynx- clear Lungs- Clear to ausculation bilaterally, normal work of breathing Heart- Regular rate and rhythm, no murmurs, rubs or gallops, PMI not laterally displaced GI- soft, NT, ND, + BS Extremities- no clubbing, cyanosis, or edema  ekg today reveals sinus rhythm 61 bpm, Qtc 406 msec  Echo is reviewed Dr Malachy Mood notes are reviewed AF clinic notes are reviewed  Assessment and Plan:  1. AF Reasonably controlled Therapeutic strategies for afib  including medicine and ablation were discussed in detail with the patient today.  I have advised tikosyn.  At this time, he would like to continue his current strategy of flecainide.  He may be more willing to consider tikosyn if his af worsens.  Given his size, LA size, I would defer ablation until he has failed medical therapy with tikosyn.  2. HTn Stable No change required today  3. OSA Compliant with CPAP  4. Obesity Weight loss is advised  Follow-up with Rudi Coco NP in the AF clinic in 3 months Follow-up with Dr Mayford Knife as scheduled I will see when needed going forward.

## 2015-06-06 NOTE — Patient Instructions (Signed)
Medication Instructions:  Your physician has recommended you make the following change in your medication:  1) Stop Red Yeast Rice   Labwork: None ordered  Testing/Procedures: None ordered  Follow-Up: Your physician wants you to follow-up in: 3 months with Rudi Coco, NP  Any Other Special Instructions Will Be Listed Below (If Applicable).

## 2015-07-08 ENCOUNTER — Other Ambulatory Visit: Payer: Self-pay

## 2015-07-08 MED ORDER — FLECAINIDE ACETATE 100 MG PO TABS: 100.0000 mg | ORAL_TABLET | Freq: Two times a day (BID) | ORAL | Status: DC

## 2015-07-08 MED ORDER — METOPROLOL SUCCINATE ER 50 MG PO TB24: 50.0000 mg | ORAL_TABLET | Freq: Every day | ORAL | Status: DC

## 2015-07-08 NOTE — Telephone Encounter (Signed)
Hillis Range, MD at 06/06/2015 5:53 PM  1. AF Reasonably controlled Therapeutic strategies for afib including medicine and ablation were discussed in detail with the patient today. I have advised tikosyn. At this time, he would like to continue his current strategy of flecainide. He may be more willing to consider tikosyn if his af worsens. Given his size, LA size, I would defer ablation until he has failed medical therapy with tikosyn. flecainide (TAMBOCOR) 100 MG tabletTAKE ONE TABLET BY MOUTH TWICE DAILY metoprolol succinate (TOPROL-XL) 50 MG 24 hr tablet Take 1 tablet (50 mg total) by mouth daily  Patient wife called in needing [facility]

## 2015-07-22 ENCOUNTER — Other Ambulatory Visit: Payer: Self-pay | Admitting: *Deleted

## 2015-07-22 MED ORDER — RIVAROXABAN 20 MG PO TABS: 20.0000 mg | ORAL_TABLET | Freq: Every day | ORAL | Status: DC

## 2015-08-02 ENCOUNTER — Other Ambulatory Visit: Payer: Self-pay

## 2015-08-02 ENCOUNTER — Telehealth: Payer: Self-pay | Admitting: Cardiology

## 2015-08-02 MED ORDER — ATORVASTATIN CALCIUM 80 MG PO TABS: 80.0000 mg | ORAL_TABLET | Freq: Every evening | ORAL | Status: DC

## 2015-08-02 MED ORDER — RIVAROXABAN 20 MG PO TABS: 20.0000 mg | ORAL_TABLET | Freq: Every day | ORAL | Status: DC

## 2015-08-02 NOTE — Telephone Encounter (Signed)
Pt called wanting to know why the Atorvastatin 80 mg and Xarelto 20 mg was never sent to [facility]. I took care of that for him, both for 90 days with 3 refills.

## 2015-08-02 NOTE — Telephone Encounter (Signed)
New message      [facility] said [facility] many faxes to refill atorvastatin  and xarelto  and no response.  Please send presc [facility].  If there is a problem, call wife back

## 2015-08-03 NOTE — Telephone Encounter (Signed)
Pt's refills of lipitor 80 mg and Xarelto 20 mg Rx was already sent to [facility] on 08/02/15. Confirmation received.

## 2015-08-25 ENCOUNTER — Encounter: Payer: Self-pay | Admitting: Cardiology

## 2015-09-07 ENCOUNTER — Encounter (HOSPITAL_COMMUNITY): Payer: Self-pay | Admitting: Nurse Practitioner

## 2015-09-07 ENCOUNTER — Ambulatory Visit (HOSPITAL_COMMUNITY)
Admission: RE | Admit: 2015-09-07 | Discharge: 2015-09-07 | Disposition: A | Discharge status: Home / Self Care | Payer: BLUE CROSS/BLUE SHIELD | Source: Ambulatory Visit | Attending: Nurse Practitioner | Admitting: Nurse Practitioner

## 2015-09-07 VITALS — BP 132/84 | HR 62 | Ht 73.0 in | Wt 304.6 lb

## 2015-09-07 DIAGNOSIS — I2510 Atherosclerotic heart disease of native coronary artery without angina pectoris: Secondary | ICD-10-CM | POA: Insufficient documentation

## 2015-09-07 DIAGNOSIS — G4733 Obstructive sleep apnea (adult) (pediatric): Secondary | ICD-10-CM | POA: Diagnosis not present

## 2015-09-07 DIAGNOSIS — I10 Essential (primary) hypertension: Secondary | ICD-10-CM | POA: Diagnosis not present

## 2015-09-07 DIAGNOSIS — I480 Paroxysmal atrial fibrillation: Secondary | ICD-10-CM | POA: Diagnosis not present

## 2015-09-07 NOTE — Progress Notes (Signed)
Patient ID: Christopher Phillips, male   DOB: 10/25/1960, 54 y.o.   MRN: [id number]     Primary Cardiologist: Dr. Mayford Knifeurner  EP: Dr. Berneda RoseAllred   Christopher Phillips is a 54 y.o. male with a h/o PAF,obesity, OSA on CPAP that recently had PAF with RVR(HR 200) while undergoing an outpatient GXT for f/u flecainide therapy. He was sent to the ER for direct admission.He also underwent LHC and had non obstrucutive disease. He converted to SR. Flecainide was increased to 100 mg bid per advise of Dr. Johney FrameAllred.He was started on Xarelto 20 mg qd.   Pt reports low afib burden and is pleased with control of afib with flecainide 100 mg bid. He is tolerating drug without side effects. Even when he is in afib, the episodes ars short and not as worrisome.Is being complaint with CPAP. No alcohol/tobacco use.  Today, he denies symptoms of palpitations, chest pain, shortness of breath, orthopnea, PND, lower extremity edema, dizziness, presyncope, syncope, or neurologic sequela. The patient is tolerating medications without difficulties and is otherwise without complaint today.   Past Medical History  Diagnosis Date  . Hypertension   . Hyperlipidemia   . Complication of anesthesia     slow to wake up  . History of kidney stones   . OSA on CPAP     severe with AHI 24.84/hr now on CPAP at 13cm H2O  . PAF (paroxysmal atrial fibrillation) (HCC)     a. Started on flecainide - developed AF RVR with GXT. Increased to 100mg  BID, started on Xarelto. CHADSVASC = 2  (HTN and nonobstructive CAD)  . Diabetes mellitus (HCC)     a. A1c 6.5 in 01/2015.  [name]. CAD (coronary artery disease)     a. nonobstructive by cath 01/2015.  . Morbid obesity [facility](HCC)    Past Surgical History  Procedure Laterality Date  . Knee surgery      BILATERAL  . Kidney stone surgery      20 YRS AGO  . Nephrolithotomy Right 02/08/2014    Procedure: NEPHROLITHOTOMY PERCUTANEOUS;  Surgeon: Milford Cageaniel Young Woodruff, MD;  Location: [facility];  Service: Urology;  Laterality: Right;   1ST STAGE (RT) PCNL      . Cystoscopy with ureteroscopy Right 02/08/2014    Procedure: Cystoscopy Left retrograde pyleogram, right antegrade pyleogram;  Surgeon: Milford Cageaniel Young Woodruff, MD;  Location: [facility];  Service: Urology;  Laterality: Right;  . Nephrolithotomy Right 02/10/2014    Procedure: RIGHT NEPHROLITHOTOMY PERCUTANEOUS SECOND LOOK;  Surgeon: Milford Cageaniel Young Woodruff, MD;  Location: [facility];  Service: Urology;  Laterality: Right;  2ND STAGE (RT) PCNL   . Left heart catheterization with coronary angiogram N/A 02/17/2015    Procedure: LEFT HEART CATHETERIZATION WITH CORONARY ANGIOGRAM;  Surgeon: Kathleene Hazelhristopher D McAlhany, MD;  [facility];  Service: Cardiovascular;  Laterality: N/A;    Current Outpatient Prescriptions  Medication Sig Dispense Refill  . allopurinol (ZYLOPRIM) 300 MG tablet Take 300 mg by mouth daily.    [name]. atorvastatin (LIPITOR) 80 MG tablet Take 1 tablet (80 mg total) by mouth every evening. 90 tablet 3  . Flaxseed, Linseed, (FLAXSEED OIL) 1200 MG CAPS Take 1,200 mg by mouth 2 (two) times daily.    . flecainide (TAMBOCOR) 100 MG tablet Take 1 tablet (100 mg total) by mouth 2 (two) times daily. 180 tablet 3  . lisinopril-hydrochlorothiazide (PRINZIDE,ZESTORETIC) 20-25 MG per tablet Take 1 tablet by mouth every morning.    [name]. MAGNESIUM PO Take 1 tablet by mouth daily.    .[name]  metoprolol succinate (TOPROL-XL) 50 MG 24 hr tablet Take 1 tablet (50 mg total) by mouth daily. 90 tablet 3  . Omega-3 Fatty Acids (OMEGA-3 FISH OIL) 1200 MG CAPS Take by mouth 2 (two) times daily.    . rivaroxaban (XARELTO) 20 MG TABS tablet Take 1 tablet (20 mg total) by mouth daily with supper. 90 tablet 3   No current facility-administered medications for this encounter.    No Known Allergies  Social History   Social History  . Marital Status: Married    Spouse Name: N/A  . Number of Children: N/A  . Years of Education: N/A   Occupational History  . Not on file.   Social History  Main Topics  . Smoking status: Former Smoker    Quit date: 01/29/1993  . Smokeless tobacco: Never Used  . Alcohol Use: No  . Drug Use: No  . Sexual Activity: Yes   Other Topics Concern  . Not on file   Social History Narrative    Family History  Problem Relation Age of Onset  . Heart failure Mother 1  . Heart failure Father 70    ROS- All systems are reviewed and negative except as per the HPI above  Physical Exam: Filed Vitals:   09/07/15 1511  BP: 132/84  Pulse: 62  Height:  (1.854 m)  Weight: 304 lb 9.6 oz (138.166 kg)    GEN- The patient is well appearing, alert and oriented x 3 today.   Head- normocephalic, atraumatic Eyes-  Sclera clear, conjunctiva pink Ears- hearing intact Oropharynx- clear Neck- supple, no JVP Lymph- no cervical lymphadenopathy Lungs- Clear to ausculation bilaterally, normal work of breathing Heart- Regular rate and rhythm, no murmurs, rubs or gallops, PMI not laterally displaced GI- soft, NT, ND, + BS Extremities- no clubbing, cyanosis, or edema MS- no significant deformity or atrophy Skin- no rash or lesion Psych- euthymic mood, full affect Neuro- strength and sensation are intact  EKG-Sinus rhythm, normal EKG, PR int 201 ms, QRS 90 ms, QTc 446 ms.  Echo  01/31/15- Left ventricle: The cavity size was normal. Wall thickness was normal. Systolic function was normal. The estimated ejection fraction was in the range of 55% to 60%. Wall motion was normal; there were no regional wall motion abnormalities. - Left atrium: The atrium was mildly to moderately dilated. - Right ventricle: The cavity size was mildly dilated. - Right atrium: The atrium was mildly dilated.  Impressions:  - Technically difficult; definity used; normal LV function; biatrial enlargement; mild RVE; trace TR.  EKG- NSR at 62 bpm, pr int 194 ms, qrs int 96 ms, qtc 428 ms Epic records reviewed   Assessment and Plan: 1. PAF Symptomatic recurrent  afib  Afib currently under good control with flecinide. With obesity, OSA, mod to severe LA enlargement  ability to maintain SR long term is reduced. Importance of lifestyle changes discussed. Chads  vasc score is 1-2. Continue xarelto. Tikosyn may be the next option, if fails flecainide, with ablation reserved until aggressive attempts at lifestyle modification have been made.  2. OSA Compliance with CPAP is a must.  3, Non obstructive CAD BB, Statin  4. HTN  Stable  F/u with Dr. Mayford Knife as scheduled 1/17, afib clinic in 6 months.

## 2015-10-06 IMAGING — RF DG ABDOMEN 1V
1 series · 2 of 2 positions shown · non-contrast
Comparison: DG RETROGRADE PYELOGRAM dated 02/08/2014

CLINICAL DATA: Right renal stone removal and stent placement.

EXAM:
ABDOMEN - 1 VIEW

[Series 1: run · 2 of 2 slices shown]
[im 1/2]
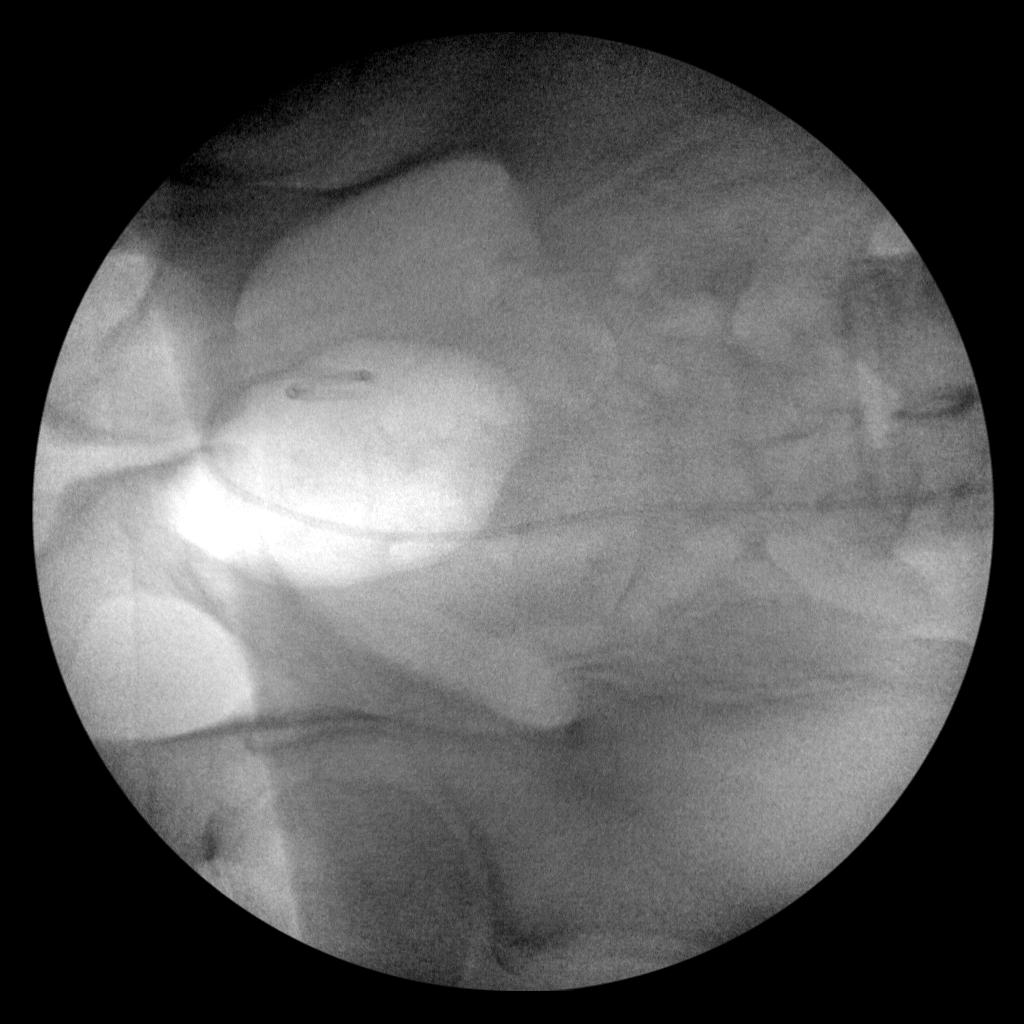
[im 2/2]
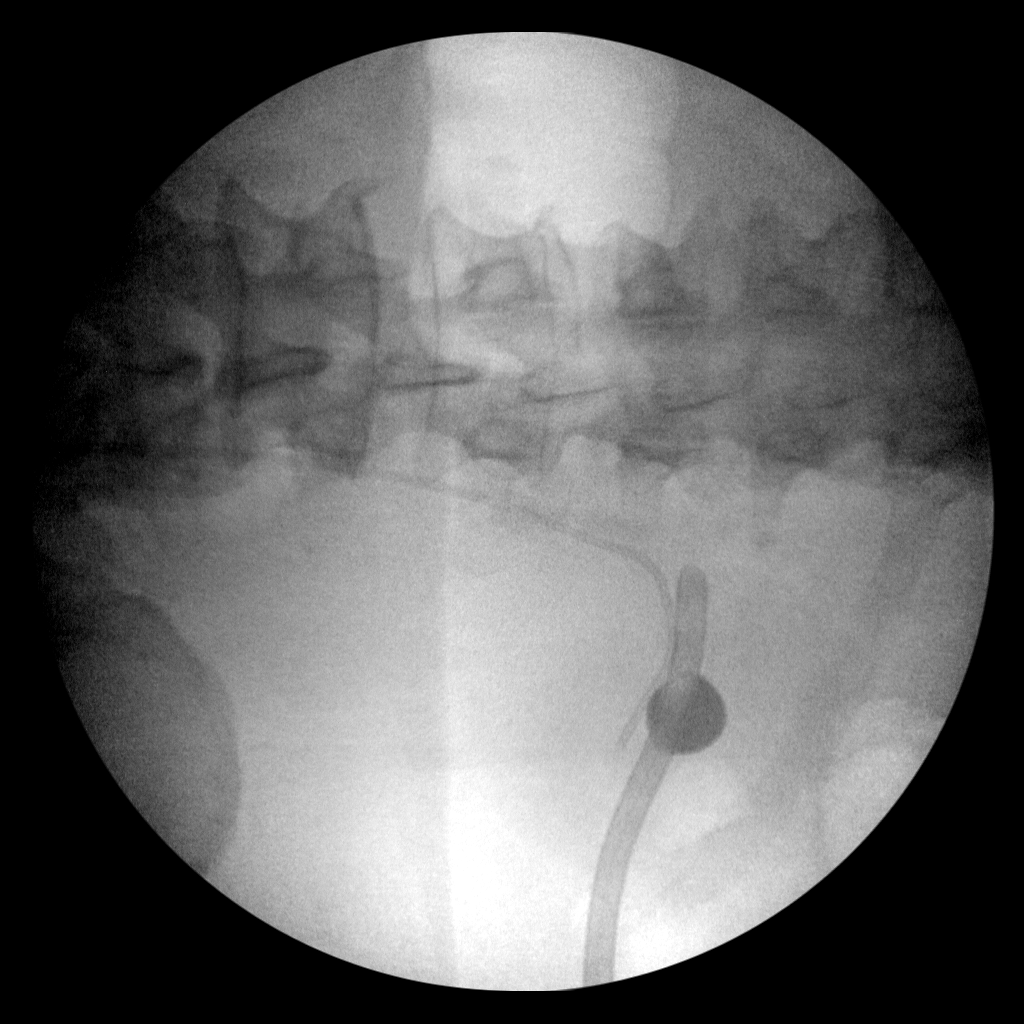

[2 of 2 positions shown; findings below may reference images not displayed]

FINDINGS: Two fluoroscopic spot images demonstrate double-J right ureteral
stent, with distal loop reconstituted in the urinary bladder. The
proximal loop is not reconstituted and is situated by a large poor
nephrostomy tube.
IMPRESSION: Right nephrostomy placement with right ureteral stent.

## 2015-10-06 IMAGING — CT CT ABDOMEN W/O CM
2 of 4 series · 16 of 46 positions shown, 18 images · non-contrast
Comparison: None.

CLINICAL DATA: Status post lithotripsy x2 4 right staghorn
calculus, right nephrostomy in place

EXAM:
CT ABDOMEN WITHOUT CONTRAST
TECHNIQUE: Multidetector CT imaging of the abdomen was performed following the
standard protocol without IV contrast.

[Series 2: rtn a/p w/o · axial · non-contrast · 0.74mm/px · z∈[-368,-63]mm · 13 of 69 slices shown, 15 images]
[im 4/69  soft-tissue]
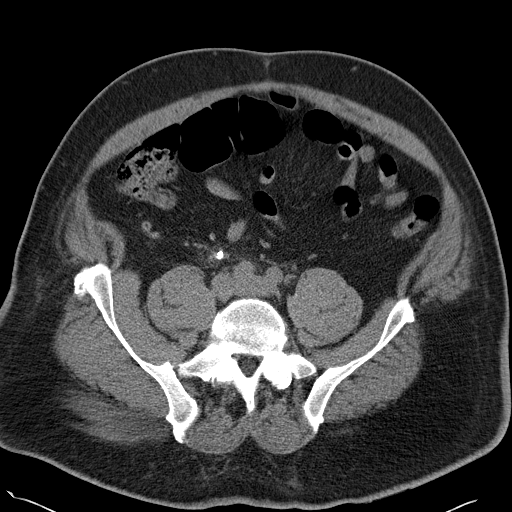
[im 4/69  bone]
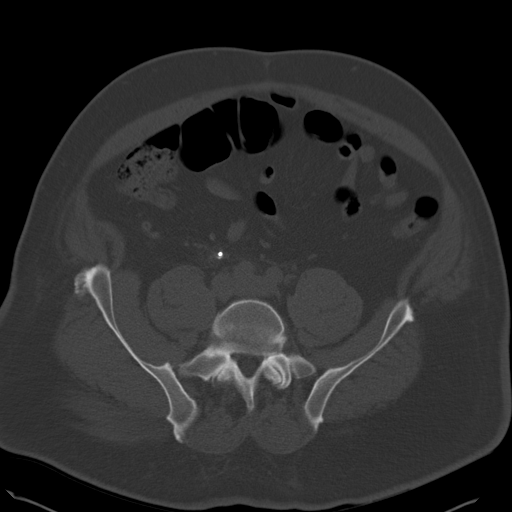
[im 10/69  soft-tissue]
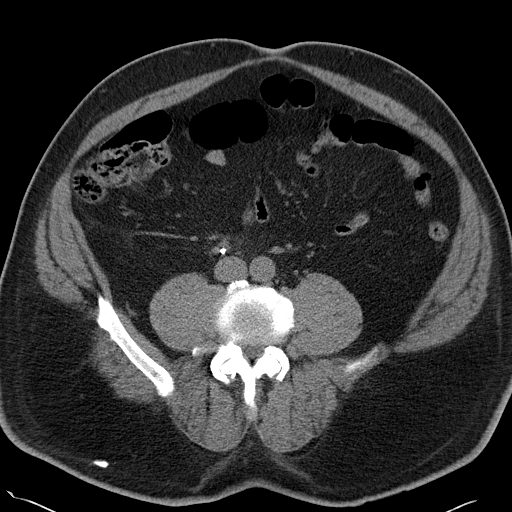
[im 16/69  soft-tissue]
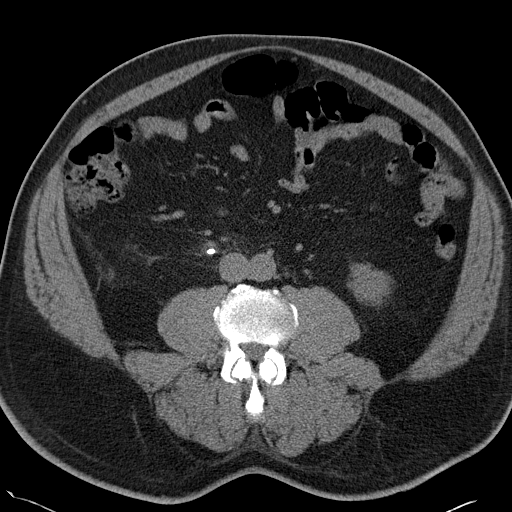
[im 19/69  soft-tissue]
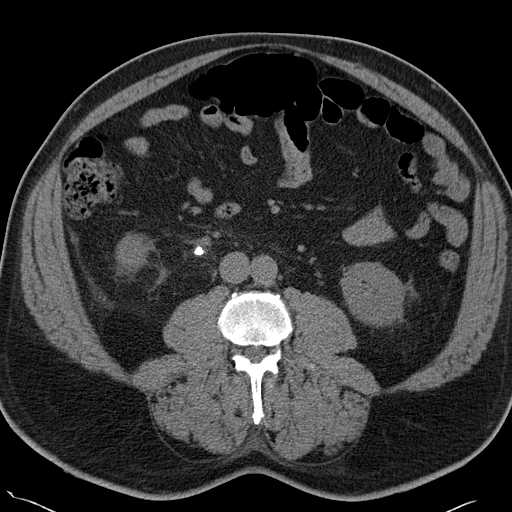
[im 25/69  soft-tissue]
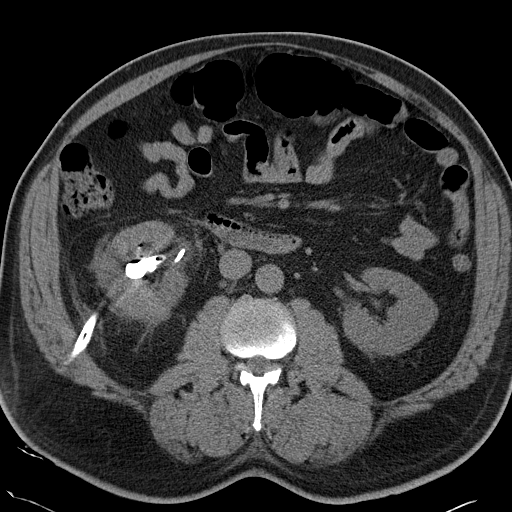
[im 28/69  soft-tissue]
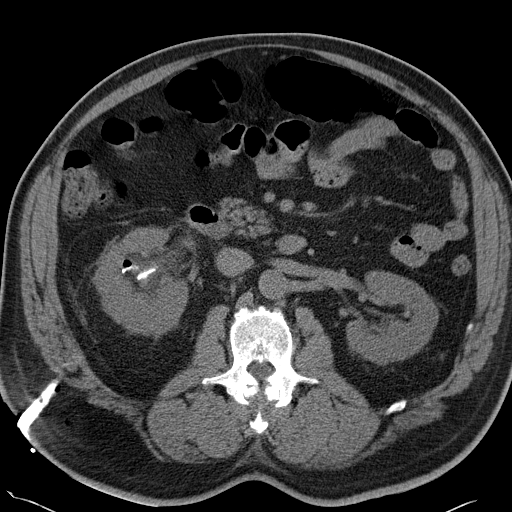
[im 35/69  soft-tissue]
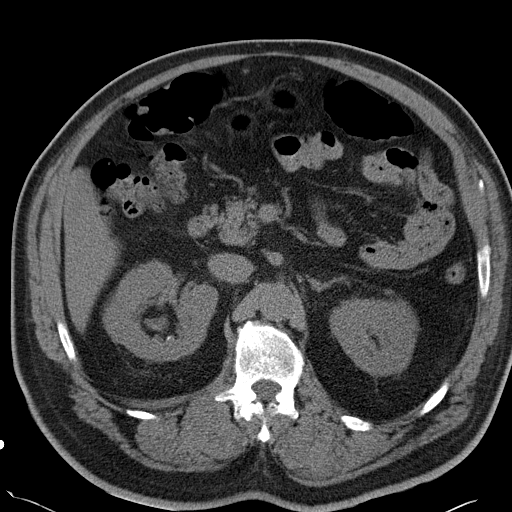
[im 41/69  soft-tissue]
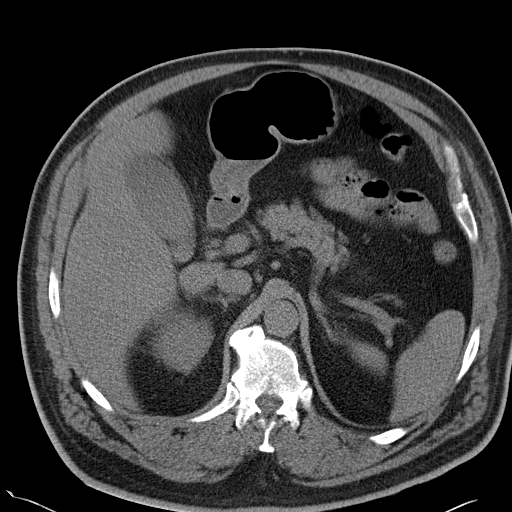
[im 44/69  soft-tissue]
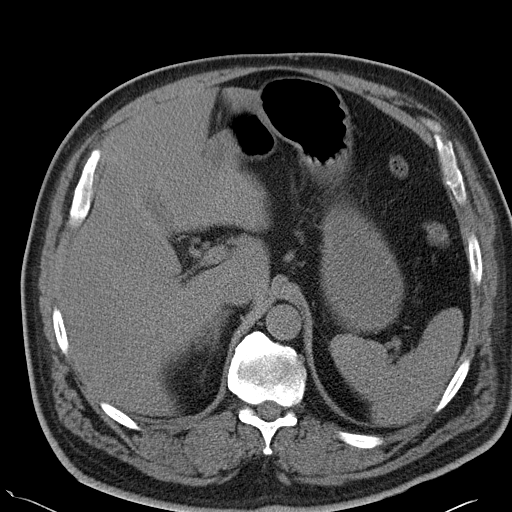
[im 44/69  bone]
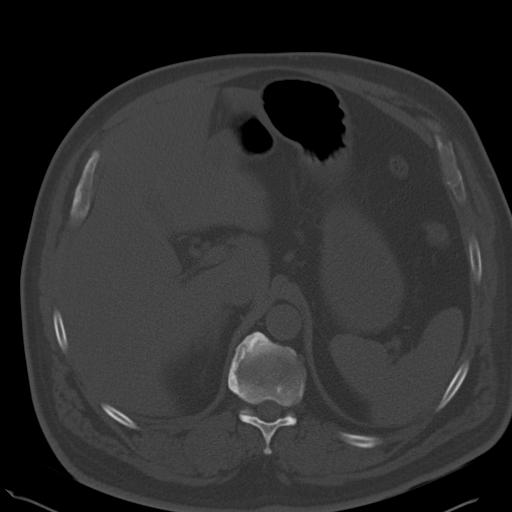
[im 50/69  soft-tissue]
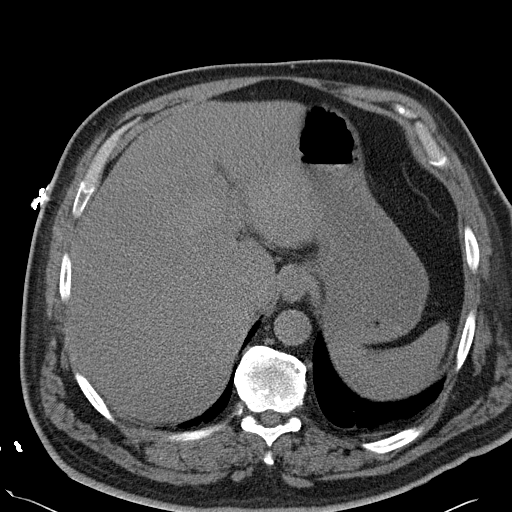
[im 53/69  soft-tissue]
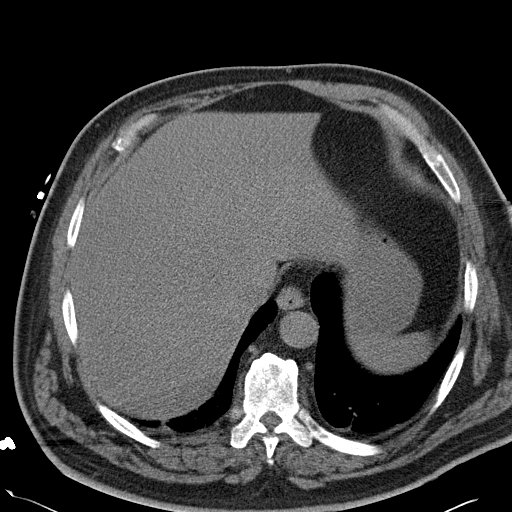
[im 59/69  soft-tissue]
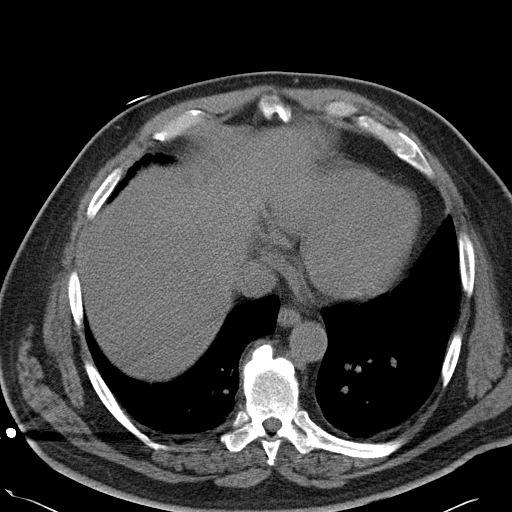
[im 65/69  soft-tissue]
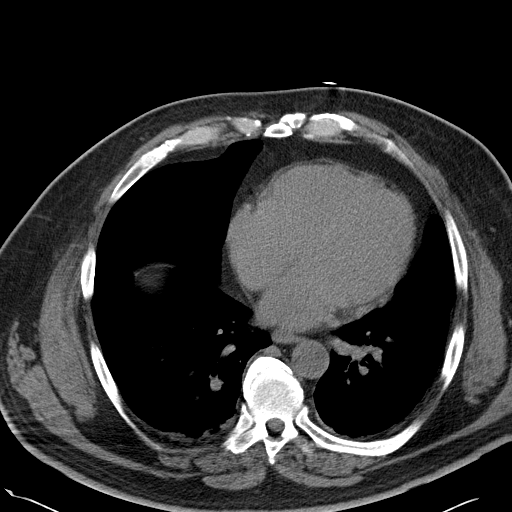

[Series 602: <mpr thick range> · coronal · 0.74mm/px · 3 of 158 slices shown]
[im 53/158  soft-tissue]
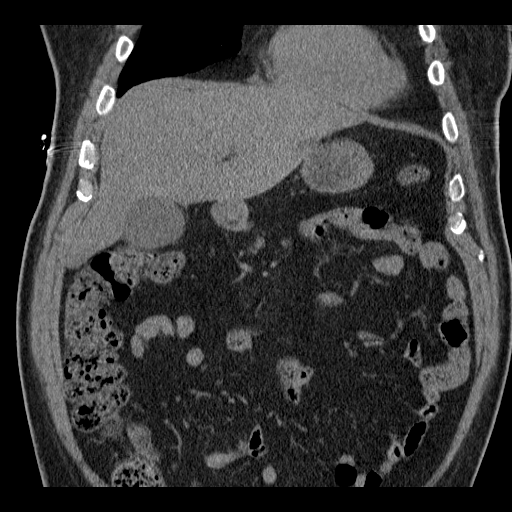
[im 70/158  soft-tissue]
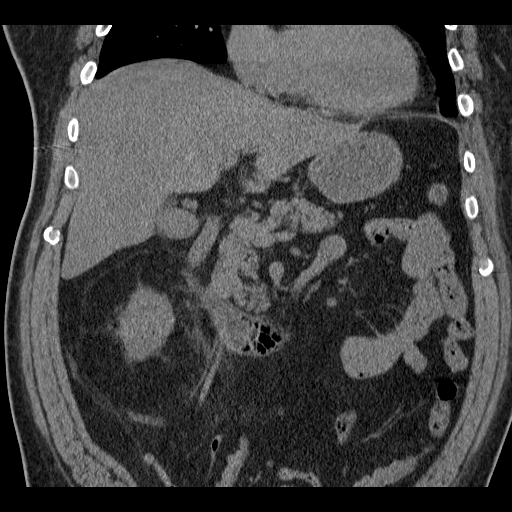
[im 88/158  soft-tissue]
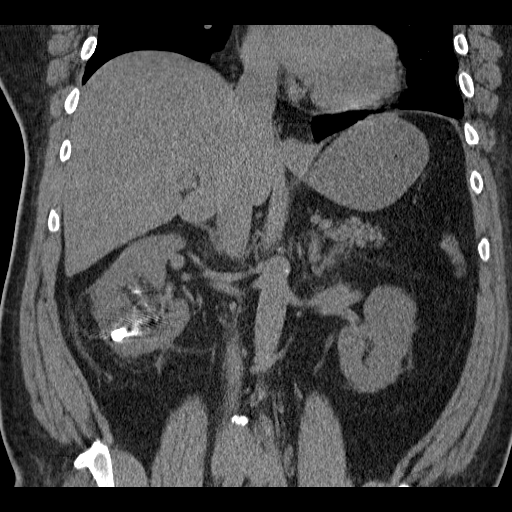

[16 of 46 positions shown; findings below may reference images not displayed]

FINDINGS: 10 mL contrast was injected via percutaneous nephrostomy catheter
prior to the imaging. This opacifies portions of the right proximal
collecting system and ureter, obscuring underlying calculi.

There are at least three possible underlying calculi within the
right renal collecting system:

--possible 4 mm calculus in the medial right upper pole (series
2/image 34)

--possible 8 x 3 mm calculus in the posterior interpolar right
kidney, just above the nephrostomy catheter (series 2/ image 40)

--possible 2 mm calculus in the lateral interpolar right kidney
(series 2/ image 42), superolateral to the nephrostomy catheter

Given contrast within the collecting system, none of these findings
are definitive. Gas within the right renal collecting system is
iatrogenic.

Mild dependent atelectasis in the bilateral lung bases.

Mild hepatic steatosis.

Unenhanced spleen, pancreas, and adrenal glands are within normal
limits.

Gallbladder is unremarkable. No intrahepatic or extrahepatic ductal
dilatation.

Left kidney is within normal limits.  No hydronephrosis.

Mild nonspecific perinephric/retroperitoneal stranding on the right,
related to recent procedure.

No abdominal ascites.

No suspicious abdominal lymphadenopathy.

Degenerative changes of the visualized thoracolumbar spine.
IMPRESSION: Contrast injected via percutaneous nephrostomy catheter opacifies
the right proximal collecting system/ureter, obscuring underlying
calculi.

At least three possible underlying calculi within the right renal
collecting system, the largest of which measures 8 x 3 mm.

These findings were discussed with Dr. Jumper on 02/11/2014 at
5364 hours. The percutaneous nephrostomy catheter has been
subsequently removed. An abdominal radiograph will be ordered to
assess for residual calculi.

## 2016-01-30 ENCOUNTER — Encounter: Payer: Self-pay | Admitting: Cardiology

## 2016-04-04 DIAGNOSIS — E782 Mixed hyperlipidemia: Secondary | ICD-10-CM | POA: Diagnosis not present

## 2016-04-04 DIAGNOSIS — I480 Paroxysmal atrial fibrillation: Secondary | ICD-10-CM | POA: Diagnosis not present

## 2016-04-04 DIAGNOSIS — R7301 Impaired fasting glucose: Secondary | ICD-10-CM | POA: Diagnosis not present

## 2016-04-04 DIAGNOSIS — I10 Essential (primary) hypertension: Secondary | ICD-10-CM | POA: Diagnosis not present

## 2016-04-19 ENCOUNTER — Other Ambulatory Visit: Payer: Self-pay

## 2016-04-19 MED ORDER — FLECAINIDE ACETATE 100 MG PO TABS: 100.0000 mg | ORAL_TABLET | Freq: Two times a day (BID) | ORAL | Status: DC

## 2016-04-19 NOTE — Telephone Encounter (Signed)
Christopher Niponna C Carroll, NP at 09/07/2015 3:29 PM  flecainide (TAMBOCOR) 100 MG tabletTake 1 tablet (100 mg total) by mouth 2 (two) times daily Assessment and Plan: 1. PAF Symptomatic recurrent afib Afib currently under good control with flecinide. With obesity, OSA, mod to severe LA enlargement ability to maintain SR long term is reduced. Importance of lifestyle changes discussed. Chads vasc score is 1-2. Continue xarelto. Tikosyn may be the next option, if fails flecainide, with ablation reserved until aggressive attempts at lifestyle modification have been made.

## 2016-05-03 ENCOUNTER — Other Ambulatory Visit: Payer: Self-pay | Admitting: Cardiology

## 2016-06-06 ENCOUNTER — Other Ambulatory Visit: Payer: Self-pay | Admitting: Internal Medicine

## 2016-06-09 ENCOUNTER — Other Ambulatory Visit: Payer: Self-pay | Admitting: Internal Medicine

## 2016-07-02 DIAGNOSIS — Z87442 Personal history of urinary calculi: Secondary | ICD-10-CM | POA: Diagnosis not present

## 2016-07-09 DIAGNOSIS — R7301 Impaired fasting glucose: Secondary | ICD-10-CM | POA: Diagnosis not present

## 2016-07-09 DIAGNOSIS — I10 Essential (primary) hypertension: Secondary | ICD-10-CM | POA: Diagnosis not present

## 2016-07-09 DIAGNOSIS — E782 Mixed hyperlipidemia: Secondary | ICD-10-CM | POA: Diagnosis not present

## 2016-07-09 DIAGNOSIS — I480 Paroxysmal atrial fibrillation: Secondary | ICD-10-CM | POA: Diagnosis not present

## 2016-07-12 ENCOUNTER — Other Ambulatory Visit: Payer: Self-pay | Admitting: Internal Medicine

## 2016-07-13 ENCOUNTER — Other Ambulatory Visit: Payer: Self-pay

## 2016-07-13 MED ORDER — FLECAINIDE ACETATE 100 MG PO TABS: 100.0000 mg | ORAL_TABLET | Freq: Two times a day (BID) | ORAL | 3 refills | Status: DC

## 2016-07-18 DIAGNOSIS — Z79899 Other long term (current) drug therapy: Secondary | ICD-10-CM | POA: Diagnosis not present

## 2016-07-18 DIAGNOSIS — Z139 Encounter for screening, unspecified: Secondary | ICD-10-CM | POA: Diagnosis not present

## 2016-07-18 DIAGNOSIS — E782 Mixed hyperlipidemia: Secondary | ICD-10-CM | POA: Diagnosis not present

## 2016-07-18 DIAGNOSIS — R7301 Impaired fasting glucose: Secondary | ICD-10-CM | POA: Diagnosis not present

## 2016-07-25 DIAGNOSIS — I10 Essential (primary) hypertension: Secondary | ICD-10-CM | POA: Diagnosis not present

## 2016-07-25 DIAGNOSIS — E039 Hypothyroidism, unspecified: Secondary | ICD-10-CM | POA: Diagnosis not present

## 2016-07-25 DIAGNOSIS — R7301 Impaired fasting glucose: Secondary | ICD-10-CM | POA: Diagnosis not present

## 2016-07-25 DIAGNOSIS — E782 Mixed hyperlipidemia: Secondary | ICD-10-CM | POA: Diagnosis not present

## 2016-07-30 ENCOUNTER — Other Ambulatory Visit: Payer: Self-pay | Admitting: Cardiology

## 2016-07-31 DIAGNOSIS — Z23 Encounter for immunization: Secondary | ICD-10-CM | POA: Diagnosis not present

## 2016-08-24 DIAGNOSIS — G4733 Obstructive sleep apnea (adult) (pediatric): Secondary | ICD-10-CM | POA: Diagnosis not present

## 2016-08-29 DIAGNOSIS — I480 Paroxysmal atrial fibrillation: Secondary | ICD-10-CM | POA: Diagnosis not present

## 2016-08-29 DIAGNOSIS — I10 Essential (primary) hypertension: Secondary | ICD-10-CM | POA: Diagnosis not present

## 2016-08-29 DIAGNOSIS — E782 Mixed hyperlipidemia: Secondary | ICD-10-CM | POA: Diagnosis not present

## 2016-08-29 DIAGNOSIS — R7301 Impaired fasting glucose: Secondary | ICD-10-CM | POA: Diagnosis not present

## 2016-09-05 ENCOUNTER — Other Ambulatory Visit: Payer: Self-pay | Admitting: Internal Medicine

## 2016-09-19 ENCOUNTER — Ambulatory Visit: Payer: BLUE CROSS/BLUE SHIELD | Admitting: Cardiology

## 2016-09-21 ENCOUNTER — Other Ambulatory Visit: Payer: Self-pay | Admitting: Cardiology

## 2016-09-21 MED ORDER — METOPROLOL SUCCINATE ER 50 MG PO TB24: 50.0000 mg | ORAL_TABLET | Freq: Every day | ORAL | 0 refills | Status: DC

## 2016-10-15 ENCOUNTER — Other Ambulatory Visit: Payer: Self-pay | Admitting: Cardiology

## 2016-10-31 DIAGNOSIS — I10 Essential (primary) hypertension: Secondary | ICD-10-CM | POA: Diagnosis not present

## 2016-10-31 DIAGNOSIS — R7301 Impaired fasting glucose: Secondary | ICD-10-CM | POA: Diagnosis not present

## 2016-10-31 DIAGNOSIS — E782 Mixed hyperlipidemia: Secondary | ICD-10-CM | POA: Diagnosis not present

## 2016-10-31 DIAGNOSIS — I480 Paroxysmal atrial fibrillation: Secondary | ICD-10-CM | POA: Diagnosis not present

## 2016-11-08 ENCOUNTER — Other Ambulatory Visit: Payer: Self-pay | Admitting: Cardiology

## 2016-11-14 ENCOUNTER — Ambulatory Visit (INDEPENDENT_AMBULATORY_CARE_PROVIDER_SITE_OTHER): Payer: BLUE CROSS/BLUE SHIELD | Admitting: Cardiology

## 2016-11-14 ENCOUNTER — Telehealth: Payer: Self-pay

## 2016-11-14 ENCOUNTER — Encounter: Payer: Self-pay | Admitting: Cardiology

## 2016-11-14 ENCOUNTER — Encounter (INDEPENDENT_AMBULATORY_CARE_PROVIDER_SITE_OTHER): Payer: Self-pay

## 2016-11-14 VITALS — BP 130/70 | HR 60 | Ht 73.0 in | Wt 296.0 lb

## 2016-11-14 DIAGNOSIS — I2510 Atherosclerotic heart disease of native coronary artery without angina pectoris: Secondary | ICD-10-CM | POA: Diagnosis not present

## 2016-11-14 DIAGNOSIS — I480 Paroxysmal atrial fibrillation: Secondary | ICD-10-CM | POA: Diagnosis not present

## 2016-11-14 DIAGNOSIS — E7800 Pure hypercholesterolemia, unspecified: Secondary | ICD-10-CM

## 2016-11-14 DIAGNOSIS — I10 Essential (primary) hypertension: Secondary | ICD-10-CM | POA: Diagnosis not present

## 2016-11-14 DIAGNOSIS — G4733 Obstructive sleep apnea (adult) (pediatric): Secondary | ICD-10-CM | POA: Diagnosis not present

## 2016-11-14 DIAGNOSIS — Z9989 Dependence on other enabling machines and devices: Secondary | ICD-10-CM

## 2016-11-14 MED ORDER — RIVAROXABAN 20 MG PO TABS: ORAL_TABLET | ORAL | 3 refills | Status: DC

## 2016-11-14 MED ORDER — FLECAINIDE ACETATE 100 MG PO TABS: 100.0000 mg | ORAL_TABLET | Freq: Two times a day (BID) | ORAL | 3 refills | Status: DC

## 2016-11-14 MED ORDER — METOPROLOL SUCCINATE ER 50 MG PO TB24: 50.0000 mg | ORAL_TABLET | Freq: Every day | ORAL | 3 refills | Status: DC

## 2016-11-14 MED ORDER — ATORVASTATIN CALCIUM 80 MG PO TABS
80.0000 mg | ORAL_TABLET | Freq: Every evening | ORAL | 3 refills | Status: DC
Start: 2016-11-14 — End: 2017-11-22

## 2016-11-14 NOTE — Patient Instructions (Signed)
Medication Instructions:  Your physician recommends that you continue on your current medications as directed. Please refer to the Current Medication list given to you today.   Labwork: TODAY: BMET, CBC, LFTs  Testing/Procedures: None  Follow-Up: Your physician wants you to follow-up in: 6 months with Dr. Mayford Knifeurner. You will receive a reminder letter in the mail two months in advance. If you don't receive a letter, please call our office to schedule the follow-up appointment.   Any Other Special Instructions Will Be Listed Below (If Applicable).     If you need a refill on your cardiac medications before your next appointment, please call your pharmacy.

## 2016-11-14 NOTE — Telephone Encounter (Signed)
Patient in today for OV with Dr. Mayford Knifeurner. He requests a call to discuss Xarelto - he states his copay is much higher and the [facility] assistance "isn't working" anymore. Informed him Bonita QuinLinda will call him tomorrow to discuss options/trouble shoot.

## 2016-11-14 NOTE — Progress Notes (Signed)
Cardiology Office Note    Date:  11/14/2016   ID:  Christopher Phillips, DOB [birth year], MRN [id number]  PCP:  Rudi Heap, MD  Cardiologist:  Armanda Magic, MD   Chief Complaint  Patient presents with  . Sleep Apnea  . Atrial Fibrillation  . Hypertension    History of Present Illness:  Christopher Phillips is a 56 y.o. male with history of PAF, low normal LVF EF 50-55% and severe OSA on CPAP. He now presents for followup. He uses a nasal pillow mask which he tolerates well and tolerates the pressure. He feels rested in the am and has no daytime sleepiness. He denies any chest pain, SOB, DOE, dizziness, or syncope. He says that he has intermittent breakthrough of afib that usually last less than 24 hours.  When he goes into it he feels sluggish.    Past Medical History:  Diagnosis Date  . CAD (coronary artery disease)    a. nonobstructive by cath 01/2015.  [name] Complication of anesthesia    slow to wake up  . Diabetes mellitus (HCC)    a. A1c 6.5 in 01/2015.  [name] History of kidney stones   . Hyperlipidemia   . Hypertension   . Morbid obesity (HCC)   . OSA on CPAP    severe with AHI 24.84/hr now on CPAP at 13cm H2O  . Persistent atrial fibrillation (HCC)    a. Started on flecainide - developed AF RVR with GXT. Increased to 100mg  BID, started on Xarelto. CHADSVASC = 3  (HTN , DM and nonobstructive CAD)    Past Surgical History:  Procedure Laterality Date  . CYSTOSCOPY WITH URETEROSCOPY Right 02/08/2014   Procedure: Cystoscopy Left retrograde pyleogram, right antegrade pyleogram;  Surgeon: Milford Cage, MD;  Location: [facility];  Service: Urology;  Laterality: Right;  . KIDNEY STONE SURGERY     20 YRS AGO  . KNEE SURGERY     BILATERAL  . LEFT HEART CATHETERIZATION WITH CORONARY ANGIOGRAM N/A 02/17/2015   Procedure: LEFT HEART CATHETERIZATION WITH CORONARY ANGIOGRAM;  Surgeon: Kathleene Hazel, MD;  Location: [facility] CATH LAB;  Service: Cardiovascular;  Laterality: N/A;  .  NEPHROLITHOTOMY Right 02/08/2014   Procedure: NEPHROLITHOTOMY PERCUTANEOUS;  Surgeon: Milford Cage, MD;  Location: [facility];  Service: Urology;  Laterality: Right;  1ST STAGE (RT) PCNL      . NEPHROLITHOTOMY Right 02/10/2014   Procedure: RIGHT NEPHROLITHOTOMY PERCUTANEOUS SECOND LOOK;  Surgeon: Milford Cage, MD;  Location: [facility];  Service: Urology;  Laterality: Right;  2ND STAGE (RT) PCNL     Current Medications: Outpatient Medications Prior to Visit  Medication Sig Dispense Refill  . allopurinol (ZYLOPRIM) 300 MG tablet Take 300 mg by mouth daily.    [name] atorvastatin (LIPITOR) 80 MG tablet TAKE 1 TABLET BY MOUTH  EVERY EVENING. 90 tablet 0  . Flaxseed, Linseed, (FLAXSEED OIL) 1200 MG CAPS Take 1,200 mg by mouth 2 (two) times daily.    . flecainide (TAMBOCOR) 100 MG tablet TAKE 1 TABLET BY MOUTH TWO  TIMES DAILY 180 tablet 0  . lisinopril-hydrochlorothiazide (PRINZIDE,ZESTORETIC) 20-25 MG per tablet Take 1 tablet by mouth every morning.    [name] MAGNESIUM PO Take 1 tablet by mouth daily.    . metoprolol succinate (TOPROL-XL) 50 MG 24 hr tablet Take 1 tablet (50 mg total) by mouth daily. Take with or immediately following a meal. 30 tablet 0  . XARELTO 20 MG TABS tablet TAKE 1 TABLET BY MOUTH  DAILY WITH SUPPER.  90 tablet 0  . Omega-3 Fatty Acids (OMEGA-3 FISH OIL) 1200 MG CAPS Take by mouth 2 (two) times daily.     No facility-administered medications prior to visit.      Allergies:   Patient has no known allergies.   Social History   Social History  . Marital status: Married    Spouse name: N/A  . Number of children: N/A  . Years of education: N/A   Social History Main Topics  . Smoking status: Former Smoker    Quit date: 01/29/1993  . Smokeless tobacco: Never Used  . Alcohol use No  . Drug use: No  . Sexual activity: Yes   Other Topics Concern  . None   Social History Narrative  . None     Family History:  The patient's family history includes Heart  failure (age of onset: 9570) in his father and mother.   ROS:   Please see the history of present illness.    ROS All other systems reviewed and are negative.  No flowsheet data found.     PHYSICAL EXAM:   VS:  BP 130/70   Pulse 60   Ht 6\' 1"  (1.854 m)   Wt 296 lb (134.3 kg)   SpO2 98%   BMI 39.05 kg/m    GEN: Well nourished, well developed, in no acute distress  HEENT: normal  Neck: no JVD, carotid bruits, or masses Cardiac: RRR; no murmurs, rubs, or gallops,no edema.  Intact distal pulses bilaterally.  Respiratory:  clear to auscultation bilaterally, normal work of breathing GI: soft, nontender, nondistended, + BS MS: no deformity or atrophy  Skin: warm and dry, no rash Neuro:  Alert and Oriented x 3, Strength and sensation are intact Psych: euthymic mood, full affect  Wt Readings from Last 3 Encounters:  11/14/16 296 lb (134.3 kg)  09/07/15 (!) 304 lb 9.6 oz (138.2 kg)  06/06/15 (!) 303 lb 6.4 oz (137.6 kg)      Studies/Labs Reviewed:   EKG:  EKG is ordered today and showed NSR at 61bpm with first degree AV block and no ST changes and septal infarct.  Recent Labs: No results found for requested labs within last 8760 hours.   Lipid Panel    Component Value Date/Time   CHOL 231 (H) 02/17/2015 0500   TRIG 215 (H) 02/17/2015 0500   HDL 33 (L) 02/17/2015 0500   CHOLHDL 7.0 02/17/2015 0500   VLDL 43 (H) 02/17/2015 0500   LDLCALC 155 (H) 02/17/2015 0500    Additional studies/ records that were reviewed today include:  CPAP download    ASSESSMENT:    1. OSA on CPAP   2. Morbid obesity (HCC)   3. Essential hypertension, benign   4. PAF (paroxysmal atrial fibrillation) (HCC)   5. Coronary artery disease involving native coronary artery of native heart without angina pectoris   6. Pure hypercholesterolemia      PLAN:  In order of problems listed above:  OSA - the patient is tolerating PAP therapy well without any problems. The PAP download was reviewed  today and showed an AHI of 0.5/hr on 13 cm H2O with 97% compliance in using more than 4 hours nightly.  The patient has been using and benefiting from CPAP use and will continue to benefit from therapy.  Morbid obesity - I have encouraged him to get into a routine exercise program and cut back on carbs and portions.  3.   HTN - BP controlled on current meds.  He will continue on ACE I/ BB and diuretic.  4.   Persistent atrial fibrillation - He is maintaining NSR.  He is still having a few breakthroughs that are short lived but make him feel very lethargic.  I have encouraged him to take an extra 50mg  when he notices that he has gone back into afib.  I have encouraged him to call if he has afib that lasts more than 24 hours. He denies any alcohol or caffeine.  He will continue on BB/flecainide and Xarelto.  I will check a BMET and CBC. 5.   Nonobstructive CAD with no angina.  Continue statin and BB. No ASA due to Xarelto. 6.   Hyperlipidemia with LDL goal < 70.  He gets his lipids checked at work.  I have asked him to bring in a copy.     Medication Adjustments/Labs and Tests Ordered: Current medicines are reviewed at length with the patient today.  Concerns regarding medicines are outlined above.  Medication changes, Labs and Tests ordered today are listed in the Patient Instructions below.  There are no Patient Instructions on file for this visit.   Signed, Armanda Magic, MD  11/14/2016 3:39 PM    [facility] [address] [place], [place], [place]  [address] Phone: [phone]; Fax: [phone]

## 2016-11-15 LAB — HEPATIC FUNCTION PANEL
ALT: 27 IU/L (ref 0–44)
AST: 24 IU/L (ref 0–40)
Albumin: 4.6 g/dL (ref 3.5–5.5)
Alkaline Phosphatase: 63 IU/L (ref 39–117)
Bilirubin Total: 0.5 mg/dL (ref 0.0–1.2)
Bilirubin, Direct: 0.14 mg/dL (ref 0.00–0.40)
TOTAL PROTEIN: 7.4 g/dL (ref 6.0–8.5)

## 2016-11-15 LAB — CBC WITH DIFFERENTIAL/PLATELET
BASOS: 0 %
Basophils Absolute: 0 10*3/uL (ref 0.0–0.2)
EOS (ABSOLUTE): 0.4 10*3/uL (ref 0.0–0.4)
EOS: 5 %
Hematocrit: 42.4 % (ref 37.5–51.0)
Hemoglobin: 14.9 g/dL (ref 13.0–17.7)
Immature Grans (Abs): 0 10*3/uL (ref 0.0–0.1)
Immature Granulocytes: 0 %
LYMPHS ABS: 2.1 10*3/uL (ref 0.7–3.1)
Lymphs: 30 %
MCH: 31.4 pg (ref 26.6–33.0)
MCHC: 35.1 g/dL (ref 31.5–35.7)
MCV: 89 fL (ref 79–97)
MONOS ABS: 0.6 10*3/uL (ref 0.1–0.9)
Monocytes: 9 %
Neutrophils Absolute: 3.8 10*3/uL (ref 1.4–7.0)
Neutrophils: 56 %
Platelets: 179 10*3/uL (ref 150–379)
RBC: 4.75 x10E6/uL (ref 4.14–5.80)
RDW: 13.9 % (ref 12.3–15.4)
WBC: 6.9 10*3/uL (ref 3.4–10.8)

## 2016-11-15 LAB — BASIC METABOLIC PANEL
BUN / CREAT RATIO: 16 (ref 9–20)
BUN: 14 mg/dL (ref 6–24)
CO2: 25 mmol/L (ref 18–29)
CREATININE: 0.87 mg/dL (ref 0.76–1.27)
Calcium: 9.3 mg/dL (ref 8.7–10.2)
Chloride: 96 mmol/L (ref 96–106)
GFR calc Af Amer: 111 mL/min/{1.73_m2} (ref >59)
GFR calc non Af Amer: 96 mL/min/{1.73_m2} (ref >59)
Glucose: 87 mg/dL (ref 65–99)
Potassium: 3.7 mmol/L (ref 3.5–5.2)
SODIUM: 137 mmol/L (ref 134–144)

## 2016-11-15 NOTE — Telephone Encounter (Signed)
Pt notified of lab results by phone with verbal understanding.  

## 2016-11-21 NOTE — Telephone Encounter (Signed)
He is having trouble getting reimbursed for his co-pay. Says he needs an original register receipt for his copay. He does mail order through [facility], so this is not easy. Says the date the rx was filled, and the date his credit card was charged don't jive. I advised him to speak with a supervisor at [facility] to get to the root of the problem. He states he has already done this. He is just frustrated at this point. He thanked me for the call back.

## 2017-02-01 ENCOUNTER — Other Ambulatory Visit (HOSPITAL_COMMUNITY): Payer: Self-pay | Admitting: Nurse Practitioner

## 2017-02-11 DIAGNOSIS — Z008 Encounter for other general examination: Secondary | ICD-10-CM | POA: Diagnosis not present

## 2017-02-11 DIAGNOSIS — Z719 Counseling, unspecified: Secondary | ICD-10-CM | POA: Diagnosis not present

## 2017-02-11 DIAGNOSIS — I10 Essential (primary) hypertension: Secondary | ICD-10-CM | POA: Diagnosis not present

## 2017-02-11 DIAGNOSIS — E782 Mixed hyperlipidemia: Secondary | ICD-10-CM | POA: Diagnosis not present

## 2017-02-11 DIAGNOSIS — E669 Obesity, unspecified: Secondary | ICD-10-CM | POA: Diagnosis not present

## 2017-02-11 DIAGNOSIS — I480 Paroxysmal atrial fibrillation: Secondary | ICD-10-CM | POA: Diagnosis not present

## 2017-04-21 ENCOUNTER — Other Ambulatory Visit (HOSPITAL_COMMUNITY): Payer: Self-pay | Admitting: Nurse Practitioner

## 2017-06-03 DIAGNOSIS — Z6839 Body mass index (BMI) 39.0-39.9, adult: Secondary | ICD-10-CM | POA: Diagnosis not present

## 2017-06-03 DIAGNOSIS — I10 Essential (primary) hypertension: Secondary | ICD-10-CM | POA: Diagnosis not present

## 2017-06-03 DIAGNOSIS — Z719 Counseling, unspecified: Secondary | ICD-10-CM | POA: Diagnosis not present

## 2017-06-03 DIAGNOSIS — E782 Mixed hyperlipidemia: Secondary | ICD-10-CM | POA: Diagnosis not present

## 2017-06-03 DIAGNOSIS — Z008 Encounter for other general examination: Secondary | ICD-10-CM | POA: Diagnosis not present

## 2017-06-03 DIAGNOSIS — E669 Obesity, unspecified: Secondary | ICD-10-CM | POA: Diagnosis not present

## 2017-06-03 DIAGNOSIS — I480 Paroxysmal atrial fibrillation: Secondary | ICD-10-CM | POA: Diagnosis not present

## 2017-06-17 ENCOUNTER — Other Ambulatory Visit (HOSPITAL_COMMUNITY): Payer: Self-pay | Admitting: Nurse Practitioner

## 2017-07-04 DIAGNOSIS — Z87442 Personal history of urinary calculi: Secondary | ICD-10-CM | POA: Diagnosis not present

## 2017-07-08 NOTE — Progress Notes (Signed)
Cardiology Office Note:    Date:  07/09/2017   ID:  Christopher Phillips, DOB [birth year], MRN [id number]  PCP:  System, Pcp Not In  Cardiologist:  Armanda Magic, MD   Referring MD: No ref. provider found   Chief Complaint  Patient presents with  . Atrial Fibrillation  . Coronary Artery Disease  . Sleep Apnea  . Hypertension    History of Present Illness:    Christopher Phillips is a 56 y.o. 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  He is here today for followup and is doing well.  He denies any chest pain or pressure, SOB, DOE, PND, orthopnea, LE edema, dizziness (except when he is in afib and stands up too fast) or syncope. He continues to go in and out of PAF occasionally but it is very well tolerated.   He is doing well with his CPAP device and thinks that he has gotten used to it.  He tolerates the mask and feels the pressure is adequate.  Since going on CPAP he feels rested in the am and has no significant daytime sleepiness.  He denies any significant mouth or nasal dryness or nasal congestion.  He does not think that he snores.     Past Medical History:  Diagnosis Date  . CAD (coronary artery disease)    a. nonobstructive by cath 01/2015.  [name] Complication of anesthesia    slow to wake up  . Diabetes mellitus (HCC)    a. A1c 6.5 in 01/2015.  [name] History of kidney stones   . Hyperlipidemia   . Hypertension   . Morbid obesity (HCC)   . OSA on CPAP    severe with AHI 24.84/hr now on CPAP at 13cm H2O  . Persistent atrial fibrillation (HCC)    a. Started on flecainide - developed AF RVR with GXT. Increased to  BID, started on Xarelto. CHADSVASC = 3  (HTN , DM and nonobstructive CAD)    Past Surgical History:  Procedure Laterality Date  . CYSTOSCOPY WITH URETEROSCOPY Right 02/08/2014   Procedure: Cystoscopy Left retrograde pyleogram, right antegrade pyleogram;   Surgeon: Milford Cage, MD;  Location: [facility];  Service: Urology;  Laterality: Right;  . KIDNEY STONE SURGERY     20 YRS AGO  . KNEE SURGERY     BILATERAL  . LEFT HEART CATHETERIZATION WITH CORONARY ANGIOGRAM N/A 02/17/2015   Procedure: LEFT HEART CATHETERIZATION WITH CORONARY ANGIOGRAM;  Surgeon: Kathleene Hazel, MD;  Location: [facility] CATH LAB;  Service: Cardiovascular;  Laterality: N/A;  . NEPHROLITHOTOMY Right 02/08/2014   Procedure: NEPHROLITHOTOMY PERCUTANEOUS;  Surgeon: Milford Cage, MD;  Location: [facility];  Service: Urology;  Laterality: Right;  1ST STAGE (RT) PCNL      . NEPHROLITHOTOMY Right 02/10/2014   Procedure: RIGHT NEPHROLITHOTOMY PERCUTANEOUS SECOND LOOK;  Surgeon: Milford Cage, MD;  Location: [facility];  Service: Urology;  Laterality: Right;  2ND STAGE (RT) PCNL     Current Medications: No outpatient prescriptions have been marked as taking for the 07/09/17 encounter (Office Visit) with Quintella Reichert, MD.     Allergies:   Patient has no known allergies.   Social History   Social History  . Marital status: Married    Spouse name: N/A  . Number of children: N/A  . Years of education: N/A   Social History Main Topics  .  Smoking status: Former Smoker    Quit date: 01/29/1993  . Smokeless tobacco: Never Used  . Alcohol use No  . Drug use: No  . Sexual activity: Yes   Other Topics Concern  . None   Social History Narrative  . None     Family History: The patient's family history includes Heart failure (age of onset: 12) in his father and mother.  ROS:   Please see the history of present illness.     All other systems reviewed and are negative.  EKGs/Labs/Other Studies Reviewed:    The following studies were reviewed today: CPAP download  EKG:  EKG is ordered today and showed atrial fibrillation at 77bpm with no ST changes  Recent Labs: 11/14/2016: ALT 27; BUN 14; Creatinine, Ser 0.87; Hemoglobin 14.9; Platelets 179;  Potassium 3.7; Sodium 137   Recent Lipid Panel    Component Value Date/Time   CHOL 231 (H) 02/17/2015 0500   TRIG 215 (H) 02/17/2015 0500   HDL 33 (L) 02/17/2015 0500   CHOLHDL 7.0 02/17/2015 0500   VLDL 43 (H) 02/17/2015 0500   LDLCALC 155 (H) 02/17/2015 0500    Physical Exam:    VS:  BP 122/84   Pulse 86   Ht  (1.854 m)   Wt (!) 307 lb (139.3 kg)   SpO2 97%   BMI 40.50 kg/m     Wt Readings from Last 3 Encounters:  07/09/17 (!) 307 lb (139.3 kg)  11/14/16 296 lb (134.3 kg)  09/07/15 (!) 304 lb 9.6 oz (138.2 kg)     GEN:  Well nourished, well developed in no acute distress HEENT: Normal NECK: No JVD; No carotid bruits LYMPHATICS: No lymphadenopathy CARDIAC: RRR, no murmurs, rubs, gallops, irregularly irregular RESPIRATORY:  Clear to auscultation without rales, wheezing or rhonchi  ABDOMEN: Soft, non-tender, non-distended MUSCULOSKELETAL:  No edema; No deformity  SKIN: Warm and dry NEUROLOGIC:  Alert and oriented x 3 PSYCHIATRIC:  Normal affect   ASSESSMENT:    1. Persistent atrial fibrillation (HCC)   2. Essential hypertension, benign   3. Coronary artery disease involving native coronary artery of native heart without angina pectoris   4. OSA on CPAP   5. Pure hypercholesterolemia    PLAN:    In order of problems listed above:  1.  Persistent atrial fibrillation - he continues to go in and out of afib and went into it yesterday and is still in it.  He says that usually he will go out of it after 24 hours.  He is well rate controlled.  He will continue on Flecainide  BID, Toprol XL  daily and Xarelto  daily.  He denies any bleeding problems and is compliant with his meds.  I will check a BMET and CBC.  He thinks that he will go out of afib today as that is the usually scenario.  I asked him to take an additional  of flecainide with his evening dose tonight and if he has not converted by tomorrow to call me.   2.  HTN - his BP is well  controlled on exam today.  He will continue on Toprol XL  daily and Prinizide 20-25mg  daily.   3.  ASCAD - nonobstructive by cath.  He has no anginal symptoms. He is not on ASA due to DOAC.  He will continue on BB and statin.   4.  OSA - the patient is tolerating PAP therapy well without any problems. The PAP download was reviewed  today and showed an AHI of 0.2/hr on 13 cm H2O with 100% compliance in using more than 4 hours nightly.  The patient has been using and benefiting from CPAP use and will continue to benefit from therapy.   5.   Hyperlipidemia with LDL goal < 70. He will continue on atorvastatin  daily. I will get an FLP and ALT.    Medication Adjustments/Labs and Tests Ordered: Current medicines are reviewed at length with the patient today.  Concerns regarding medicines are outlined above.  No orders of the defined types were placed in this encounter.  No orders of the defined types were placed in this encounter.   Signed, Armanda Magic, MD  07/09/2017 2:49 PM    [facility]

## 2017-07-09 ENCOUNTER — Encounter (INDEPENDENT_AMBULATORY_CARE_PROVIDER_SITE_OTHER): Payer: Self-pay

## 2017-07-09 ENCOUNTER — Ambulatory Visit (INDEPENDENT_AMBULATORY_CARE_PROVIDER_SITE_OTHER): Payer: BLUE CROSS/BLUE SHIELD | Admitting: Cardiology

## 2017-07-09 ENCOUNTER — Encounter: Payer: Self-pay | Admitting: Cardiology

## 2017-07-09 VITALS — BP 122/84 | HR 86 | Ht 73.0 in | Wt 307.0 lb

## 2017-07-09 DIAGNOSIS — I2510 Atherosclerotic heart disease of native coronary artery without angina pectoris: Secondary | ICD-10-CM | POA: Diagnosis not present

## 2017-07-09 DIAGNOSIS — E7800 Pure hypercholesterolemia, unspecified: Secondary | ICD-10-CM | POA: Diagnosis not present

## 2017-07-09 DIAGNOSIS — G4733 Obstructive sleep apnea (adult) (pediatric): Secondary | ICD-10-CM

## 2017-07-09 DIAGNOSIS — Z9989 Dependence on other enabling machines and devices: Secondary | ICD-10-CM

## 2017-07-09 DIAGNOSIS — I10 Essential (primary) hypertension: Secondary | ICD-10-CM | POA: Diagnosis not present

## 2017-07-09 DIAGNOSIS — I4819 Other persistent atrial fibrillation: Secondary | ICD-10-CM

## 2017-07-09 DIAGNOSIS — I481 Persistent atrial fibrillation: Secondary | ICD-10-CM | POA: Diagnosis not present

## 2017-07-09 NOTE — Addendum Note (Signed)
Addended by: Marylou Flesher on: 07/09/2017 03:57 PM   Modules accepted: Orders

## 2017-07-09 NOTE — Patient Instructions (Signed)
Lab work today ( bmet,cbc )   Your physician wants you to follow-up in: 6 months. You will receive a reminder letter in the mail two months in advance. If you don't receive a letter, please call our office to schedule the follow-up appointment.

## 2017-07-10 DIAGNOSIS — Z139 Encounter for screening, unspecified: Secondary | ICD-10-CM | POA: Diagnosis not present

## 2017-07-10 DIAGNOSIS — Z79899 Other long term (current) drug therapy: Secondary | ICD-10-CM | POA: Diagnosis not present

## 2017-07-10 DIAGNOSIS — E782 Mixed hyperlipidemia: Secondary | ICD-10-CM | POA: Diagnosis not present

## 2017-07-10 DIAGNOSIS — R7301 Impaired fasting glucose: Secondary | ICD-10-CM | POA: Diagnosis not present

## 2017-07-10 LAB — BASIC METABOLIC PANEL
BUN/Creatinine Ratio: 15 (ref 9–20)
BUN: 13 mg/dL (ref 6–24)
CALCIUM: 9.6 mg/dL (ref 8.7–10.2)
CO2: 23 mmol/L (ref 20–29)
CREATININE: 0.86 mg/dL (ref 0.76–1.27)
Chloride: 99 mmol/L (ref 96–106)
GFR calc Af Amer: 110 mL/min/{1.73_m2} (ref >59)
GFR calc non Af Amer: 96 mL/min/{1.73_m2} (ref >59)
GLUCOSE: 109 mg/dL — AB (ref 65–99)
POTASSIUM: 4.2 mmol/L (ref 3.5–5.2)
SODIUM: 140 mmol/L (ref 134–144)

## 2017-07-10 LAB — CBC WITH DIFFERENTIAL/PLATELET
Basophils Absolute: 0 10*3/uL (ref 0.0–0.2)
Basos: 0 %
EOS (ABSOLUTE): 0.4 10*3/uL (ref 0.0–0.4)
Eos: 5 %
Hematocrit: 46.5 % (ref 37.5–51.0)
Hemoglobin: 16.2 g/dL (ref 13.0–17.7)
IMMATURE GRANS (ABS): 0 10*3/uL (ref 0.0–0.1)
Immature Granulocytes: 0 %
LYMPHS: 31 %
Lymphocytes Absolute: 2.6 10*3/uL (ref 0.7–3.1)
MCH: 31.4 pg (ref 26.6–33.0)
MCHC: 34.8 g/dL (ref 31.5–35.7)
MCV: 90 fL (ref 79–97)
MONOS ABS: 0.8 10*3/uL (ref 0.1–0.9)
Monocytes: 9 %
Neutrophils Absolute: 4.7 10*3/uL (ref 1.4–7.0)
Neutrophils: 55 %
PLATELETS: 181 10*3/uL (ref 150–379)
RBC: 5.16 x10E6/uL (ref 4.14–5.80)
RDW: 14.3 % (ref 12.3–15.4)
WBC: 8.5 10*3/uL (ref 3.4–10.8)

## 2017-07-19 ENCOUNTER — Other Ambulatory Visit (HOSPITAL_COMMUNITY): Payer: Self-pay | Admitting: Nurse Practitioner

## 2017-07-22 DIAGNOSIS — Z719 Counseling, unspecified: Secondary | ICD-10-CM | POA: Diagnosis not present

## 2017-07-22 DIAGNOSIS — E782 Mixed hyperlipidemia: Secondary | ICD-10-CM | POA: Diagnosis not present

## 2017-07-22 DIAGNOSIS — Z008 Encounter for other general examination: Secondary | ICD-10-CM | POA: Diagnosis not present

## 2017-07-22 DIAGNOSIS — Z6841 Body Mass Index (BMI) 40.0 and over, adult: Secondary | ICD-10-CM | POA: Diagnosis not present

## 2017-07-22 DIAGNOSIS — I480 Paroxysmal atrial fibrillation: Secondary | ICD-10-CM | POA: Diagnosis not present

## 2017-07-22 DIAGNOSIS — I10 Essential (primary) hypertension: Secondary | ICD-10-CM | POA: Diagnosis not present

## 2017-08-29 ENCOUNTER — Other Ambulatory Visit: Payer: Self-pay | Admitting: Cardiology

## 2017-10-02 DIAGNOSIS — I10 Essential (primary) hypertension: Secondary | ICD-10-CM | POA: Diagnosis not present

## 2017-10-02 DIAGNOSIS — I480 Paroxysmal atrial fibrillation: Secondary | ICD-10-CM | POA: Diagnosis not present

## 2017-10-02 DIAGNOSIS — Z719 Counseling, unspecified: Secondary | ICD-10-CM | POA: Diagnosis not present

## 2017-10-30 DIAGNOSIS — E782 Mixed hyperlipidemia: Secondary | ICD-10-CM | POA: Diagnosis not present

## 2017-10-30 DIAGNOSIS — I10 Essential (primary) hypertension: Secondary | ICD-10-CM | POA: Diagnosis not present

## 2017-10-30 DIAGNOSIS — Z719 Counseling, unspecified: Secondary | ICD-10-CM | POA: Diagnosis not present

## 2017-10-30 DIAGNOSIS — Z008 Encounter for other general examination: Secondary | ICD-10-CM | POA: Diagnosis not present

## 2017-10-30 DIAGNOSIS — Z79899 Other long term (current) drug therapy: Secondary | ICD-10-CM | POA: Diagnosis not present

## 2017-10-30 DIAGNOSIS — I480 Paroxysmal atrial fibrillation: Secondary | ICD-10-CM | POA: Diagnosis not present

## 2017-11-13 DIAGNOSIS — R7301 Impaired fasting glucose: Secondary | ICD-10-CM | POA: Diagnosis not present

## 2017-11-13 DIAGNOSIS — I10 Essential (primary) hypertension: Secondary | ICD-10-CM | POA: Diagnosis not present

## 2017-11-13 DIAGNOSIS — E782 Mixed hyperlipidemia: Secondary | ICD-10-CM | POA: Diagnosis not present

## 2017-11-13 DIAGNOSIS — I480 Paroxysmal atrial fibrillation: Secondary | ICD-10-CM | POA: Diagnosis not present

## 2017-11-22 ENCOUNTER — Other Ambulatory Visit: Payer: Self-pay | Admitting: *Deleted

## 2017-11-22 MED ORDER — RIVAROXABAN 20 MG PO TABS: ORAL_TABLET | ORAL | 2 refills | Status: DC

## 2017-11-22 MED ORDER — ATORVASTATIN CALCIUM 80 MG PO TABS: 80.0000 mg | ORAL_TABLET | Freq: Every evening | ORAL | 1 refills | Status: DC

## 2018-01-21 ENCOUNTER — Ambulatory Visit: Payer: BLUE CROSS/BLUE SHIELD | Admitting: Family Medicine

## 2018-01-21 ENCOUNTER — Encounter: Payer: Self-pay | Admitting: Family Medicine

## 2018-01-21 VITALS — BP 136/76 | HR 66 | Temp 97.1°F | Ht 73.0 in | Wt 306.8 lb

## 2018-01-21 DIAGNOSIS — R05 Cough: Secondary | ICD-10-CM | POA: Diagnosis not present

## 2018-01-21 DIAGNOSIS — R059 Cough, unspecified: Secondary | ICD-10-CM

## 2018-01-21 MED ORDER — BENZONATATE 100 MG PO CAPS: 100.0000 mg | ORAL_CAPSULE | Freq: Three times a day (TID) | ORAL | 0 refills | Status: DC | PRN

## 2018-01-21 MED ORDER — METHYLPREDNISOLONE ACETATE 80 MG/ML IJ SUSP
80.0000 mg | Freq: Once | INTRAMUSCULAR | Status: AC
  Administered 2018-01-21: 80 mg via INTRAMUSCULAR

## 2018-01-21 MED ORDER — FLUTICASONE PROPIONATE 50 MCG/ACT NA SUSP: 2.0000 | Freq: Every day | NASAL | 6 refills | Status: AC

## 2018-01-21 NOTE — Patient Instructions (Signed)
Great to see you!  Start flonase 2 sprays once daily, I would also recommend starting a daily antihistamine like Zyrtec or Allegra.

## 2018-01-21 NOTE — Addendum Note (Signed)
Addended by: Lorelee CoverOSTOSKY, JESSICA C on: 01/21/2018 11:22 AM   Modules accepted: Orders

## 2018-01-21 NOTE — Progress Notes (Signed)
   HPI  Patient presents today here with cough.  Patient explains that cough and nasal congestion started suddenly Sunday. His wife had influenza last week, however he is very confident he does not have influenza.  He denies body aches, chills, sweats, or subjective fever.  He denies shortness of breath. He states that he is tolerating food and fluids like usual.  He denies sinus pain or pressure.   PMH: Smoking status noted ROS: Per HPI  Objective: BP 136/76   Pulse 66   Temp (!) 97.1 F (36.2 C) (Oral)   Ht 6\' 1"  (1.854 m)   Wt (!) 306 lb 12.8 oz (139.2 kg)   SpO2 99%   BMI 40.48 kg/m  Gen: NAD, alert, cooperative with exam HEENT: NCAT, oropharynx moist and clear, no tenderness to palpation of sinuses throughout, TMs normal bilaterally CV: RRR, good S1/S2, no murmur Resp: CTABL, no wheezes, non-labored Ext: No edema, warm Neuro: Alert and oriented, No gross deficits  Assessment and plan:  #Cough Likely upper airway cough, viral versus allergic rhinitis with postnasal drip IM Depo-Medrol given, discussed effects on his blood sugar Also given Tessalon, Flonase, recommended over-the-counter antihistamine like Zyrtec or Allegra. No signs of serious bacterial infection   Meds ordered this encounter  Medications  . fluticasone (FLONASE) 50 MCG/ACT nasal spray    Sig: Place 2 sprays into both nostrils daily.    Dispense:  16 g    Refill:  6  . benzonatate (TESSALON PERLES) 100 MG capsule    Sig: Take 1 capsule (100 mg total) by mouth 3 (three) times daily as needed for cough.    Dispense:  20 capsule    Refill:  0    Murtis SinkSam Levander Katzenstein, MD [facility] 01/21/2018, 11:10 AM

## 2018-02-05 ENCOUNTER — Encounter: Payer: Self-pay | Admitting: Cardiology

## 2018-02-13 ENCOUNTER — Encounter: Payer: Self-pay | Admitting: Nurse Practitioner

## 2018-02-13 ENCOUNTER — Ambulatory Visit (INDEPENDENT_AMBULATORY_CARE_PROVIDER_SITE_OTHER): Payer: BLUE CROSS/BLUE SHIELD | Admitting: Nurse Practitioner

## 2018-02-13 VITALS — BP 103/70 | HR 80 | Temp 96.8°F | Ht 73.0 in | Wt 288.0 lb

## 2018-02-13 DIAGNOSIS — E860 Dehydration: Secondary | ICD-10-CM | POA: Diagnosis not present

## 2018-02-13 DIAGNOSIS — R197 Diarrhea, unspecified: Secondary | ICD-10-CM | POA: Diagnosis not present

## 2018-02-13 DIAGNOSIS — R739 Hyperglycemia, unspecified: Secondary | ICD-10-CM

## 2018-02-13 DIAGNOSIS — R5383 Other fatigue: Secondary | ICD-10-CM

## 2018-02-13 DIAGNOSIS — R634 Abnormal weight loss: Secondary | ICD-10-CM | POA: Diagnosis not present

## 2018-02-13 DIAGNOSIS — E7800 Pure hypercholesterolemia, unspecified: Secondary | ICD-10-CM | POA: Diagnosis not present

## 2018-02-13 LAB — GLUCOSE HEMOCUE WAIVED: Glu Hemocue Waived: 138 mg/dL — ABNORMAL HIGH (ref 65–99)

## 2018-02-13 LAB — BAYER DCA HB A1C WAIVED: HB A1C (BAYER DCA - WAIVED): 6.7 % (ref <7.0)

## 2018-02-13 NOTE — Progress Notes (Signed)
   Subjective:    Patient ID: Christopher Phillips, male    DOB: October 23, 1960, 57 y.o.   MRN: [id number]  HPI Patient comes in today c/o nausea and vomiting that started over the weekend. He is very fatigued. He says that he has been loosing weight over the last 3 weeks. He went to work this morning and started feeling terrible. Said he just felt like he could not put one foot in front of the other. He has actually lost 18lbs in the last 3 weeks.     Review of Systems  Constitutional: Positive for appetite change (decreased) and fatigue. Negative for chills and fever.  HENT: Negative.   Respiratory: Negative.   Cardiovascular: Negative.   Gastrointestinal: Positive for diarrhea, nausea and vomiting.  Genitourinary: Negative.   Neurological: Positive for dizziness. Negative for headaches.  Psychiatric/Behavioral: Negative.   All other systems reviewed and are negative.      Objective:   Physical Exam  Constitutional: He is oriented to person, place, and time. He appears well-developed and well-nourished. He appears distressed (mild).  Cardiovascular: Normal rate and regular rhythm.  Pulmonary/Chest: Effort normal and breath sounds normal.  Abdominal: Soft. Bowel sounds are normal. He exhibits no distension and no mass. There is no tenderness. There is no rebound and no guarding.  Neurological: He is alert and oriented to person, place, and time.  Skin: Skin is warm.  Psychiatric: He has a normal mood and affect. His behavior is normal. Judgment and thought content normal.   BP 103/70   Pulse 80   Temp (!) 96.8 F (36 C) (Oral)   Ht '6\' 1"'$  (1.854 m)   Wt 288 lb (130.6 kg)   BMI 38.00 kg/m   BS 136    Assessment & Plan:   1. Fatigue, unspecified type   2. Weight loss   3. Dehydration   4. Diarrhea, unspecified type    Orders Placed This Encounter  Procedures  . Cdiff NAA+O+P+Stool Culture  . CMP14+EGFR  . Lipid panel  . Thyroid Panel With TSH  . CBC with Differential/Platelet    . Rocky mtn spotted fvr abs pnl(IgG+IgM)  . Lyme Ab/Western Blot Reflex  . Glucose Hemocue Waived  . Bayer DCA Hb A1c Waived  . Bayer DCA Hb A1c Waived    Labs pending Force fluids Imodium AD OTC for diarrhea Will talk once all labs are back  [employer], FNP

## 2018-02-14 ENCOUNTER — Other Ambulatory Visit: Payer: BLUE CROSS/BLUE SHIELD

## 2018-02-14 DIAGNOSIS — R197 Diarrhea, unspecified: Secondary | ICD-10-CM | POA: Diagnosis not present

## 2018-02-17 ENCOUNTER — Ambulatory Visit: Payer: BLUE CROSS/BLUE SHIELD | Admitting: Cardiology

## 2018-02-17 DIAGNOSIS — Z719 Counseling, unspecified: Secondary | ICD-10-CM | POA: Diagnosis not present

## 2018-02-17 DIAGNOSIS — Z008 Encounter for other general examination: Secondary | ICD-10-CM | POA: Diagnosis not present

## 2018-02-17 DIAGNOSIS — I480 Paroxysmal atrial fibrillation: Secondary | ICD-10-CM | POA: Diagnosis not present

## 2018-02-17 DIAGNOSIS — I10 Essential (primary) hypertension: Secondary | ICD-10-CM | POA: Diagnosis not present

## 2018-02-17 LAB — CBC WITH DIFFERENTIAL/PLATELET
BASOS ABS: 0 10*3/uL (ref 0.0–0.2)
Basos: 0 %
EOS (ABSOLUTE): 0.2 10*3/uL (ref 0.0–0.4)
Eos: 2 %
HEMOGLOBIN: 17.1 g/dL (ref 13.0–17.7)
Hematocrit: 49.1 % (ref 37.5–51.0)
IMMATURE GRANS (ABS): 0 10*3/uL (ref 0.0–0.1)
IMMATURE GRANULOCYTES: 0 %
LYMPHS ABS: 1.7 10*3/uL (ref 0.7–3.1)
LYMPHS: 18 %
MCH: 31.2 pg (ref 26.6–33.0)
MCHC: 34.8 g/dL (ref 31.5–35.7)
MCV: 90 fL (ref 79–97)
MONOCYTES: 7 %
Monocytes Absolute: 0.6 10*3/uL (ref 0.1–0.9)
NEUTROS PCT: 73 %
Neutrophils Absolute: 6.9 10*3/uL (ref 1.4–7.0)
Platelets: 197 10*3/uL (ref 150–379)
RBC: 5.48 x10E6/uL (ref 4.14–5.80)
RDW: 14.4 % (ref 12.3–15.4)
WBC: 9.4 10*3/uL (ref 3.4–10.8)

## 2018-02-17 LAB — CMP14+EGFR
ALT: 35 [IU]/L (ref 0–44)
AST: 19 [IU]/L (ref 0–40)
Albumin/Globulin Ratio: 1.6 (ref 1.2–2.2)
Albumin: 4.6 g/dL (ref 3.5–5.5)
Alkaline Phosphatase: 72 [IU]/L (ref 39–117)
BUN/Creatinine Ratio: 15 (ref 9–20)
BUN: 14 mg/dL (ref 6–24)
Bilirubin Total: 0.4 mg/dL (ref 0.0–1.2)
CO2: 22 mmol/L (ref 20–29)
Calcium: 9.3 mg/dL (ref 8.7–10.2)
Chloride: 104 mmol/L (ref 96–106)
Creatinine, Ser: 0.94 mg/dL (ref 0.76–1.27)
GFR calc Af Amer: 103 mL/min/{1.73_m2}
GFR calc non Af Amer: 89 mL/min/{1.73_m2}
Globulin, Total: 2.9 g/dL (ref 1.5–4.5)
Glucose: 139 mg/dL — ABNORMAL HIGH (ref 65–99)
Potassium: 4.4 mmol/L (ref 3.5–5.2)
Sodium: 143 mmol/L (ref 134–144)
Total Protein: 7.5 g/dL (ref 6.0–8.5)

## 2018-02-17 LAB — RMSF, IGG, IFA

## 2018-02-17 LAB — ROCKY MTN SPOTTED FVR ABS PNL(IGG+IGM)
RMSF IGG: POSITIVE — AB
RMSF IGM: 0.25 {index} (ref 0.00–0.89)

## 2018-02-17 LAB — LYME AB/WESTERN BLOT REFLEX: LYME DISEASE AB, QUANT, IGM: <0.8 index (ref 0.00–0.79)

## 2018-02-17 LAB — LIPID PANEL
Chol/HDL Ratio: 2.9 ratio (ref 0.0–5.0)
Cholesterol, Total: 67 mg/dL — ABNORMAL LOW (ref 100–199)
HDL: 23 mg/dL — ABNORMAL LOW
LDL Calculated: 27 mg/dL (ref 0–99)
Triglycerides: 85 mg/dL (ref 0–149)
VLDL Cholesterol Cal: 17 mg/dL (ref 5–40)

## 2018-02-17 LAB — THYROID PANEL WITH TSH
Free Thyroxine Index: 1.7 (ref 1.2–4.9)
T3 Uptake Ratio: 22 % — ABNORMAL LOW (ref 24–39)
T4 TOTAL: 7.8 ug/dL (ref 4.5–12.0)
TSH: 4.62 u[IU]/mL — ABNORMAL HIGH (ref 0.450–4.500)

## 2018-02-18 ENCOUNTER — Other Ambulatory Visit: Payer: Self-pay | Admitting: Nurse Practitioner

## 2018-02-18 MED ORDER — DOXYCYCLINE HYCLATE 100 MG PO TABS: 100.0000 mg | ORAL_TABLET | Freq: Two times a day (BID) | ORAL | 0 refills | Status: DC

## 2018-02-18 NOTE — Progress Notes (Signed)
doxycycline

## 2018-02-19 LAB — CDIFF NAA+O+P+STOOL CULTURE
E coli, Shiga toxin Assay: NEGATIVE
Toxigenic C. Difficile by PCR: NEGATIVE

## 2018-05-02 ENCOUNTER — Encounter: Payer: Self-pay | Admitting: Cardiology

## 2018-05-02 ENCOUNTER — Ambulatory Visit: Payer: BLUE CROSS/BLUE SHIELD | Admitting: Cardiology

## 2018-05-02 VITALS — BP 116/80 | HR 83 | Ht 73.0 in | Wt 294.4 lb

## 2018-05-02 DIAGNOSIS — G4733 Obstructive sleep apnea (adult) (pediatric): Secondary | ICD-10-CM | POA: Diagnosis not present

## 2018-05-02 DIAGNOSIS — Z9989 Dependence on other enabling machines and devices: Secondary | ICD-10-CM | POA: Diagnosis not present

## 2018-05-02 DIAGNOSIS — I481 Persistent atrial fibrillation: Secondary | ICD-10-CM

## 2018-05-02 DIAGNOSIS — E7800 Pure hypercholesterolemia, unspecified: Secondary | ICD-10-CM

## 2018-05-02 DIAGNOSIS — I10 Essential (primary) hypertension: Secondary | ICD-10-CM

## 2018-05-02 DIAGNOSIS — I2510 Atherosclerotic heart disease of native coronary artery without angina pectoris: Secondary | ICD-10-CM | POA: Diagnosis not present

## 2018-05-02 DIAGNOSIS — I4819 Other persistent atrial fibrillation: Secondary | ICD-10-CM

## 2018-05-02 NOTE — Progress Notes (Signed)
Cardiology Office Note:   Date: 05/02/2018  ID: [name], DOB 1961-10-04, MRN [id number]  PCP: Bennie Pierini, FNP  Cardiologist: No primary care provider on file.  Referring MD: No ref. provider found      Chief Complaint  Patient presents with  . Coronary Artery Disease  . Hypertension  . Hyperlipidemia  . Atrial Fibrillation  . Sleep Apnea   History of Present Illness:   Christopher Phillips is a 57 y.o. 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  he is here today for followup and is doing well. He denies any chest pain or pressure, SOB, DOE, PND, orthopnea, LE edema, dizziness or syncope. He has not had a lot of breakthrough A. fib. He says he has not had any for 3 months but then yesterday went back into A. fib. He says he is still in it but thinks he is trying to get out of it. He is compliant with his meds and is tolerating meds with no SE. He is doing well with his CPAP device. He tolerates the mask and feels the pressure is adequate. Since going on CPAP he feels rested in the am and has no significant daytime sleepiness. He denies any significant mouth or nasal dryness or nasal congestion. He does not think that he snores.       Past Medical History:  Diagnosis Date  . CAD (coronary artery disease)    a. nonobstructive by cath 01/2015.  [name] Complication of anesthesia    slow to wake up  . Diabetes mellitus (HCC)    a. A1c 6.5 in 01/2015.  [name] History of kidney stones   . Hyperlipidemia   . Hypertension   . Morbid obesity (HCC)   . OSA on CPAP    severe with AHI 24.84/hr now on CPAP at 13cm H2O  . Persistent atrial fibrillation (HCC)    a. Started on flecainide - developed AF RVR with GXT. Increased to 100mg  BID, started on Xarelto. CHADSVASC = 3 (HTN , DM and nonobstructive CAD)        Past Surgical History:  Procedure Laterality Date  .  CYSTOSCOPY WITH URETEROSCOPY Right 02/08/2014   Procedure: Cystoscopy Left retrograde pyleogram, right antegrade pyleogram; Surgeon: Milford Cage, MD; Location: [facility]; Service: Urology; Laterality: Right;  . KIDNEY STONE SURGERY     20 YRS AGO  . KNEE SURGERY     BILATERAL  . LEFT HEART CATHETERIZATION WITH CORONARY ANGIOGRAM N/A 02/17/2015   Procedure: LEFT HEART CATHETERIZATION WITH CORONARY ANGIOGRAM; Surgeon: Kathleene Hazel, MD; Location: [facility] CATH LAB; Service: Cardiovascular; Laterality: N/A;  . NEPHROLITHOTOMY Right 02/08/2014   Procedure: NEPHROLITHOTOMY PERCUTANEOUS; Surgeon: Milford Cage, MD; Location: [facility]; Service: Urology; Laterality: Right; 1ST STAGE (RT) PCNL   . NEPHROLITHOTOMY Right 02/10/2014   Procedure: RIGHT NEPHROLITHOTOMY PERCUTANEOUS SECOND LOOK; Surgeon: Milford Cage, MD; Location: [facility]; Service: Urology; Laterality: Right; 2ND STAGE (RT) PCNL    Current Medications:  Active Medications                                                            Allergies: Patient has no known allergies.  Social  History        Socioeconomic History  . Marital status: Married    Spouse name: Not on file  . Number of children: Not on file  . Years of education: Not on file  . Highest education level: Not on file  Occupational History  . Not on file  Social Needs  . Financial resource strain: Not on file  . Food insecurity:    Worry: Not on file    Inability: Not on file  . Transportation needs:    Medical: Not on file    Non-medical: Not on file  Tobacco Use  . Smoking status: Former Smoker    Last attempt to quit: 01/29/1993    Years since quitting: 25.2  . Smokeless tobacco: Never Used  Substance and Sexual Activity  . Alcohol use: No    Alcohol/week: 0.0 oz  . Drug use: No  . Sexual activity: Yes  Lifestyle  . Physical activity:    Days per week: Not on file    Minutes per session: Not on file  . Stress: Not on  file  Relationships  . Social connections:    Talks on phone: Not on file    Gets together: Not on file    Attends religious service: Not on file    Active member of club or organization: Not on file    Attends meetings of clubs or organizations: Not on file    Relationship status: Not on file  Other Topics Concern  . Not on file  Social History Narrative  . Not on file   Family History:  The patient's family history includes Heart failure (age of onset: 82) in his father and mother.  ROS:  Please see the history of present illness.  ROS  All other systems reviewed and negative.  EKGs/Labs/Other Studies Reviewed:   The following studies were reviewed today:  PAP download  EKG: EKG is ordered today and showed  Recent Labs:  02/13/2018: ALT 35; BUN 14; Creatinine, Ser 0.94; Hemoglobin 17.1; Platelets 197; Potassium 4.4; Sodium 143; TSH 4.620  Recent Lipid Panel  Labs (Brief)                                                           Physical Exam:   VS: BP 116/80  Pulse 83  Ht 6\' 1"  (1.854 m)  Wt 294 lb 6.4 oz (133.5 kg)  SpO2 94%  BMI 38.84 kg/m     Wt Readings from Last 3 Encounters:  05/02/18 294 lb 6.4 oz (133.5 kg)  02/13/18 288 lb (130.6 kg)  01/21/18 (!) 306 lb 12.8 oz (139.2 kg)   GEN: Well nourished, well developed in no acute distress  HEENT: Normal  NECK: No JVD; No carotid bruits  LYMPHATICS: No lymphadenopathy  CARDIAC: Lower, no murmurs, rubs, gallops  RESPIRATORY: Clear to auscultation without rales, wheezing or rhonchi  ABDOMEN: Soft, non-tender, non-distended  MUSCULOSKELETAL: No edema; No deformity  SKIN: Warm and dry  NEUROLOGIC: Alert and oriented x 3  PSYCHIATRIC: Normal affect  ASSESSMENT:    1. Persistent atrial fibrillation (HCC)   2. Essential hypertension, benign   3. Coronary artery disease involving native coronary artery of native heart without angina pectoris   4. OSA on CPAP   5. Pure hypercholesterolemia  PLAN:     In order of problems listed above:  1. Persistent atrial fibrillation - he is in atrial fibrillation on exam and by EKG with controlled heart rate. He will continue on Toprol XL 50 mg daily, flecainide 100 mg twice daily. I have asked him to take an extra 50 mg of flecainide when he gets home tonight.  He will come back to the office next Wednesday for repeat EKG to make sure he has converted back to sinus rhythm.. He is on Xarelto 20 mg daily for anticoagulation. His hemoglobin was 14.9 on 11/14/2016 and creatinine was stable at 0.94 on 02/13/2018.   2. Hypertension - BP is well controlled on exam today. He will continue on Toprol XL 50 mg daily and Prinzide 20-25 mg daily.   3. Nonobstructive ASCAD by cath 2016 -no symptoms. He will continue on atorvastatin 80 mg daily and beta-blocker. He is not on aspirin due to DOAC.   4. OSA - the patient is tolerating PAP therapy well without any problems. The PAP download was reviewed today and showed an AHI of 0.3/hr on 13 cm H2O with 100% compliance in using more than 4 hours nightly. The patient has been using and benefiting from PAP use and will continue to benefit from therapy.   5. Hyperlipidemia - LDL goal is less than 70. He will continue on atorvastatin 80 mg daily. LDL was at goal at 27 on 02/13/2018.   Medication Adjustments/Labs and Tests Ordered:  Current medicines are reviewed at length with the patient today. Concerns regarding medicines are outlined above.  No orders of the defined types were placed in this encounter.   No orders of the defined types were placed in this encounter.   Signed,  Armanda Magicraci Dearion Huot, MD  05/02/2018 4:18 PM  [facility]

## 2018-05-02 NOTE — Patient Instructions (Signed)
Medication Instructions:  Your physician recommends that you continue on your current medications as directed. Please refer to the Current Medication list given to you today.  If you need a refill on your cardiac medications, please contact your pharmacy first.  Labwork: None ordered   Testing/Procedures: None ordered   Follow-Up: Your physician would like you to return May 07, 2018 at 11AM for a EKG.  Your physician wants you to follow-up in: 6 months with Dr. Mayford Knifeurner. You will receive a reminder letter in the mail two months in advance. If you don't receive a letter, please call our office to schedule the follow-up appointment.  Any Other Special Instructions Will Be Listed Below (If Applicable).   [facility]    Lyda PeroneRena Loralee Weitzman, RN  [phone]  If you need a refill on your cardiac medications before your next appointment, please call your pharmacy.

## 2018-05-07 ENCOUNTER — Ambulatory Visit: Payer: BLUE CROSS/BLUE SHIELD

## 2018-05-07 ENCOUNTER — Ambulatory Visit (INDEPENDENT_AMBULATORY_CARE_PROVIDER_SITE_OTHER): Payer: BLUE CROSS/BLUE SHIELD

## 2018-05-07 VITALS — BP 120/78 | HR 70 | Ht 73.2 in | Wt 296.0 lb

## 2018-05-07 DIAGNOSIS — I481 Persistent atrial fibrillation: Secondary | ICD-10-CM

## 2018-05-07 DIAGNOSIS — I4819 Other persistent atrial fibrillation: Secondary | ICD-10-CM

## 2018-05-07 NOTE — Progress Notes (Signed)
1.) Reason for visit: EKG to check if patient is out of Afib.  2.) Name of MD requesting visit: Dr. Mayford Knifeurner  3.) H&P: Patient took an additional 50 mg of Flecainide in the evening, to control the Afib. Was seen in the office on 05/02/18 by Dr. Mayford Knifeurner, she requested a follow up EKG to confirm if Afib resolved.  4.) ROS related to problem: He presents with no symptoms and believes the Afib has stopped. His blood pressure was stable at 120/78  5.) Assessment and plan per MD: EKG was reviewed by Dr. Ladona Ridgelaylor. Message will be routed to Dr. Mayford Knifeurner.

## 2018-05-13 NOTE — Addendum Note (Signed)
Addended by: Jacqlyn KraussOBERTSON, Cola Gane on: 05/13/2018 02:34 PM   Modules accepted: Orders

## 2018-06-11 DIAGNOSIS — Z7189 Other specified counseling: Secondary | ICD-10-CM | POA: Diagnosis not present

## 2018-06-11 DIAGNOSIS — I10 Essential (primary) hypertension: Secondary | ICD-10-CM | POA: Diagnosis not present

## 2018-06-11 DIAGNOSIS — E782 Mixed hyperlipidemia: Secondary | ICD-10-CM | POA: Diagnosis not present

## 2018-06-11 DIAGNOSIS — R7301 Impaired fasting glucose: Secondary | ICD-10-CM | POA: Diagnosis not present

## 2018-06-11 DIAGNOSIS — Z139 Encounter for screening, unspecified: Secondary | ICD-10-CM | POA: Diagnosis not present

## 2018-06-11 DIAGNOSIS — Z008 Encounter for other general examination: Secondary | ICD-10-CM | POA: Diagnosis not present

## 2018-06-19 ENCOUNTER — Other Ambulatory Visit: Payer: Self-pay | Admitting: Cardiology

## 2018-06-21 ENCOUNTER — Other Ambulatory Visit (HOSPITAL_COMMUNITY): Payer: Self-pay | Admitting: Cardiology

## 2018-06-24 ENCOUNTER — Other Ambulatory Visit: Payer: Self-pay | Admitting: Cardiology

## 2018-06-27 NOTE — Telephone Encounter (Signed)
Xarelto 20mg  refill request received; pt is 57 yrs old, wt-133.5kg, Crea-0.94 on 02/13/18, last seen by Dr. Mayford Knife on 05/02/18, CrCl-159.69ml/min; will send in refill to requested pharmacy.

## 2018-07-02 DIAGNOSIS — E782 Mixed hyperlipidemia: Secondary | ICD-10-CM | POA: Diagnosis not present

## 2018-07-02 DIAGNOSIS — R7301 Impaired fasting glucose: Secondary | ICD-10-CM | POA: Diagnosis not present

## 2018-08-19 DIAGNOSIS — N200 Calculus of kidney: Secondary | ICD-10-CM | POA: Diagnosis not present

## 2018-09-29 DIAGNOSIS — Z139 Encounter for screening, unspecified: Secondary | ICD-10-CM | POA: Diagnosis not present

## 2018-09-29 DIAGNOSIS — E782 Mixed hyperlipidemia: Secondary | ICD-10-CM | POA: Diagnosis not present

## 2018-09-29 DIAGNOSIS — Z79899 Other long term (current) drug therapy: Secondary | ICD-10-CM | POA: Diagnosis not present

## 2018-10-08 DIAGNOSIS — R7301 Impaired fasting glucose: Secondary | ICD-10-CM | POA: Diagnosis not present

## 2018-10-08 DIAGNOSIS — E559 Vitamin D deficiency, unspecified: Secondary | ICD-10-CM | POA: Diagnosis not present

## 2018-10-08 DIAGNOSIS — E782 Mixed hyperlipidemia: Secondary | ICD-10-CM | POA: Diagnosis not present

## 2018-12-31 DIAGNOSIS — E782 Mixed hyperlipidemia: Secondary | ICD-10-CM | POA: Diagnosis not present

## 2018-12-31 DIAGNOSIS — I10 Essential (primary) hypertension: Secondary | ICD-10-CM | POA: Diagnosis not present

## 2018-12-31 DIAGNOSIS — R7301 Impaired fasting glucose: Secondary | ICD-10-CM | POA: Diagnosis not present

## 2018-12-31 DIAGNOSIS — Z139 Encounter for screening, unspecified: Secondary | ICD-10-CM | POA: Diagnosis not present

## 2018-12-31 DIAGNOSIS — Z008 Encounter for other general examination: Secondary | ICD-10-CM | POA: Diagnosis not present

## 2018-12-31 DIAGNOSIS — Z719 Counseling, unspecified: Secondary | ICD-10-CM | POA: Diagnosis not present

## 2019-01-29 ENCOUNTER — Other Ambulatory Visit (HOSPITAL_COMMUNITY): Payer: Self-pay | Admitting: Cardiology

## 2019-02-18 ENCOUNTER — Other Ambulatory Visit (HOSPITAL_COMMUNITY): Payer: Self-pay | Admitting: Cardiology

## 2019-03-02 ENCOUNTER — Telehealth: Payer: Self-pay

## 2019-03-02 NOTE — Telephone Encounter (Signed)
Virtual Visit Pre-Appointment Phone Call  "(Name), I am calling you today to discuss your upcoming appointment. We are currently trying to limit exposure to the virus that causes COVID-19 by seeing patients at home rather than in the office."  1. "What is the BEST phone number to call the day of the visit?" - include this in appointment notes  2. "Do you have or have access to (through a family member/friend) a smartphone with video capability that we can use for your visit?" a. If yes - list this number in appt notes as "cell" (if different from BEST phone #) and list the appointment type as a VIDEO visit in appointment notes b. If no - list the appointment type as a PHONE visit in appointment notes  3. Confirm consent - "In the setting of the current Covid19 crisis, you are scheduled for a (phone or video) visit with your provider on (date) at (time).  Just as we do with many in-office visits, in order for you to participate in this visit, we must obtain consent.  If you'd like, I can send this to your mychart (if signed up) or email for you to review.  Otherwise, I can obtain your verbal consent now.  All virtual visits are billed to your insurance company just like a normal visit would be.  By agreeing to a virtual visit, we'd like you to understand that the technology does not allow for your provider to perform an examination, and thus may limit your provider's ability to fully assess your condition. If your provider identifies any concerns that need to be evaluated in person, we will make arrangements to do so.  Finally, though the technology is pretty good, we cannot assure that it will always work on either your or our end, and in the setting of a video visit, we may have to convert it to a phone-only visit.  In either situation, we cannot ensure that we have a secure connection.  Are you willing to proceed?" STAFF: Did the patient verbally acknowledge consent to telehealth visit? Document  YES/NO here: Yes  4. Advise patient to be prepared - "Two hours prior to your appointment, go ahead and check your blood pressure, pulse, oxygen saturation, and your weight (if you have the equipment to check those) and write them all down. When your visit starts, your provider will ask you for this information. If you have an Apple Watch or Kardia device, please plan to have heart rate information ready on the day of your appointment. Please have a pen and paper handy nearby the day of the visit as well."  5. Give patient instructions for MyChart download to smartphone OR Doximity/Doxy.me as below if video visit (depending on what platform provider is using)  6. Inform patient they will receive a phone call 15 minutes prior to their appointment time (may be from unknown caller ID) so they should be prepared to answer    TELEPHONE CALL NOTE  Christopher Phillips has been deemed a candidate for a follow-up tele-health visit to limit community exposure during the Covid-19 pandemic. I spoke with the patient via phone to ensure availability of phone/video source, confirm preferred email & phone number, and discuss instructions and expectations.  I reminded Christopher Phillips to be prepared with any vital sign and/or heart rhythm information that could potentially be obtained via home monitoring, at the time of his visit. I reminded Christopher Phillips to expect a phone call prior to his visit.  [name], CMA 03/02/2019 10:53 AM   INSTRUCTIONS FOR DOWNLOADING THE MYCHART APP TO SMARTPHONE  - The patient must first make sure to have activated MyChart and know their login information - If Apple, go to [employer] and type in MyChart in the search bar and download the app. If Android, ask patient to go to [employer] and type in [facility] in the search bar and download the app. The app is free but as with any other app downloads, their phone may require them to verify saved payment information or Apple/Android  password.  - The patient will need to then log into the app with their MyChart username and password, and select [facility] as their healthcare provider to link the account. When it is time for your visit, go to the MyChart app, find appointments, and click Begin Video Visit. Be sure to Select Allow for your device to access the Microphone and Camera for your visit. You will then be connected, and your provider will be with you shortly.  **If they have any issues connecting, or need assistance please contact MyChart service desk [phone])**  **If using a computer, in order to ensure the best quality for their visit they will need to use either of the following Internet Browsers: [employer] or Google Chrome**  IF USING DOXIMITY or DOXY.ME - The patient will receive a link just prior to their visit by text.     FULL LENGTH CONSENT FOR TELE-HEALTH VISIT   I hereby voluntarily request, consent and authorize [facility] and its employed or contracted physicians, physician assistants, nurse practitioners or other licensed health care professionals (the Practitioner), to provide me with telemedicine health care services (the "Services") as deemed necessary by the treating Practitioner. I acknowledge and consent to receive the Services by the Practitioner via telemedicine. I understand that the telemedicine visit will involve communicating with the Practitioner through live audiovisual communication technology and the disclosure of certain medical information by electronic transmission. I acknowledge that I have been given the opportunity to request an in-person assessment or other available alternative prior to the telemedicine visit and am voluntarily participating in the telemedicine visit.  I understand that I have the right to withhold or withdraw my consent to the use of telemedicine in the course of my care at any time, without affecting my right to future care or treatment,  and that the Practitioner or I may terminate the telemedicine visit at any time. I understand that I have the right to inspect all information obtained and/or recorded in the course of the telemedicine visit and may receive copies of available information for a reasonable fee.  I understand that some of the potential risks of receiving the Services via telemedicine include:  [name] Delay or interruption in medical evaluation due to technological equipment failure or disruption; . Information transmitted may not be sufficient (e.g. poor resolution of images) to allow for appropriate medical decision making by the Practitioner; and/or  . In rare instances, security protocols could fail, causing a breach of personal health information.  Furthermore, I acknowledge that it is my responsibility to provide information about my medical history, conditions and care that is complete and accurate to the best of my ability. I acknowledge that Practitioner's advice, recommendations, and/or decision may be based on factors not within their control, such as incomplete or inaccurate data provided by me or distortions of diagnostic images or specimens that may result from electronic transmissions. I understand that the  practice of medicine is not an Chief Strategy Officer and that Practitioner makes no warranties or guarantees regarding treatment outcomes. I acknowledge that I will receive a copy of this consent concurrently upon execution via email to the email address I last provided but may also request a printed copy by calling the office of [place].    I understand that my insurance will be billed for this visit.   I have read or had this consent read to me. . I understand the contents of this consent, which adequately explains the benefits and risks of the Services being provided via telemedicine.  . I have been provided ample opportunity to ask questions regarding this consent and the Services and have had my questions  answered to my satisfaction. . I give my informed consent for the services to be provided through the use of telemedicine in my medical care  By participating in this telemedicine visit I agree to the above.

## 2019-03-03 ENCOUNTER — Encounter: Payer: Self-pay | Admitting: Cardiology

## 2019-03-03 ENCOUNTER — Other Ambulatory Visit: Payer: Self-pay

## 2019-03-03 ENCOUNTER — Telehealth (INDEPENDENT_AMBULATORY_CARE_PROVIDER_SITE_OTHER): Payer: BLUE CROSS/BLUE SHIELD | Admitting: Cardiology

## 2019-03-03 VITALS — BP 124/72 | HR 65 | Ht 73.0 in | Wt 286.0 lb

## 2019-03-03 DIAGNOSIS — I10 Essential (primary) hypertension: Secondary | ICD-10-CM | POA: Diagnosis not present

## 2019-03-03 DIAGNOSIS — G4733 Obstructive sleep apnea (adult) (pediatric): Secondary | ICD-10-CM | POA: Diagnosis not present

## 2019-03-03 DIAGNOSIS — Z7189 Other specified counseling: Secondary | ICD-10-CM

## 2019-03-03 DIAGNOSIS — I2510 Atherosclerotic heart disease of native coronary artery without angina pectoris: Secondary | ICD-10-CM | POA: Diagnosis not present

## 2019-03-03 DIAGNOSIS — I4819 Other persistent atrial fibrillation: Secondary | ICD-10-CM

## 2019-03-03 DIAGNOSIS — E7800 Pure hypercholesterolemia, unspecified: Secondary | ICD-10-CM

## 2019-03-03 DIAGNOSIS — Z9989 Dependence on other enabling machines and devices: Secondary | ICD-10-CM

## 2019-03-03 MED ORDER — FLECAINIDE ACETATE 100 MG PO TABS: 100.0000 mg | ORAL_TABLET | Freq: Two times a day (BID) | ORAL | 3 refills | Status: DC

## 2019-03-03 MED ORDER — RIVAROXABAN 20 MG PO TABS: ORAL_TABLET | ORAL | 3 refills | Status: DC

## 2019-03-03 MED ORDER — METOPROLOL SUCCINATE ER 50 MG PO TB24: ORAL_TABLET | ORAL | 3 refills | Status: DC

## 2019-03-03 MED ORDER — LISINOPRIL-HYDROCHLOROTHIAZIDE 20-25 MG PO TABS: 1.0000 | ORAL_TABLET | Freq: Every morning | ORAL | 3 refills | Status: DC

## 2019-03-03 MED ORDER — ATORVASTATIN CALCIUM 80 MG PO TABS: 80.0000 mg | ORAL_TABLET | Freq: Every evening | ORAL | 3 refills | Status: DC

## 2019-03-03 NOTE — Progress Notes (Signed)
Virtual Visit via Telephone Note   This visit type was conducted due to national recommendations for restrictions regarding the COVID-19 Pandemic (e.g. social distancing) in an effort to limit this patient's exposure and mitigate transmission in our community.  Due to his co-morbid illnesses, this patient is at least at moderate risk for complications without adequate follow up.  This format is felt to be most appropriate for this patient at this time.  All issues noted in this document were discussed and addressed.  A limited physical exam was performed with this format.  Please refer to the patient's chart for his consent to telehealth for [facility].  Evaluation Performed:  Follow-up visit  This visit type was conducted due to national recommendations for restrictions regarding the COVID-19 Pandemic (e.g. [facility] dist[facility]).  This format is felt to be most appropriate for this patient at this time.  All issues noted in this document were discussed and addressed.  No physical exam was performed (except for noted visual exam findings with Video Visits).  Please refer to the patient's chart (MyChart message for video visits and phone note for telephone visits) for the patient's consent to telehealth for [facility].  Date:  03/03/2019   ID:  Christopher ChardBarry Phillips, DOB 10/18/1961, MRN [id number]  Patient Location:  Home  [facility]  PCP:  Patient, No Pcp Per  Cardiologist:  Armanda Magicraci Turner, MD Electrophysiologist:  None   Chief Complaint:  Atrial fibrillation, CAD, OSA, HTN  History of Present Illness:    Christopher Phillips is a 58 y.o. male who presents via audio/video conferencing for a telehealth visit today.    Christopher Phillips is a [age] y.o. 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  He is here today for followup and is doing well.  He denies any chest  pain or pressure, SOB, DOE, PND, orthopnea, LE edema, dizziness, palpitations or syncope. He is compliant with his meds and is tolerating meds with no SE.  He is doing well with his CPAP device and thinks that he has gotten used to it.  He tolerates the mask and feels the pressure is adequate.  Since going on CPAP he feels rested in the am and has no significant daytime sleepiness.  He denies any significant mouth or nasal dryness or nasal congestion.  He does not think that he snores.    The patient does not have symptoms concerning for COVID-19 infection (fever, chills, cough, or new shortness of breath).   Prior CV studies:   The following studies were reviewed today:  PAP compliance download, labs  Past Medical History:  Diagnosis Date  . CAD (coronary artery disease)    a. nonobstructive by cath 01/2015.  [name]. Complication of anesthesia    slow to wake up  . Diabetes mellitus (HCC)    a. A1c 6.5 in 01/2015.  [name]. History of kidney stones   . Hyperlipidemia   . Hypertension   . Morbid obesity (HCC)   . OSA on CPAP    severe with AHI 24.84/hr now on CPAP at 13cm H2O  . Persistent atrial fibrillation    a. Started on flecainide - developed AF RVR with GXT. Increased to 100mg  BID, started on Xarelto. CHADSVASC = 3  (HTN , DM and nonobstructive CAD)   Past Surgical History:  Procedure Laterality Date  . CYSTOSCOPY WITH  URETEROSCOPY Right 02/08/2014   Procedure: Cystoscopy Left retrograde pyleogram, right antegrade pyleogram;  Surgeon: Milford Cage, MD;  Location: [facility];  Service: Urology;  Laterality: Right;  . KIDNEY STONE SURGERY     20 YRS AGO  . KNEE SURGERY     BILATERAL  . LEFT HEART CATHETERIZATION WITH CORONARY ANGIOGRAM N/A 02/17/2015   Procedure: LEFT HEART CATHETERIZATION WITH CORONARY ANGIOGRAM;  Surgeon: Kathleene Hazel, MD;  Location: [facility] CATH LAB;  Service: Cardiovascular;  Laterality: N/A;  . NEPHROLITHOTOMY Right 02/08/2014   Procedure: NEPHROLITHOTOMY  PERCUTANEOUS;  Surgeon: Milford Cage, MD;  Location: [facility];  Service: Urology;  Laterality: Right;  1ST STAGE (RT) PCNL      . NEPHROLITHOTOMY Right 02/10/2014   Procedure: RIGHT NEPHROLITHOTOMY PERCUTANEOUS SECOND LOOK;  Surgeon: Milford Cage, MD;  Location: [facility];  Service: Urology;  Laterality: Right;  2ND STAGE (RT) PCNL      Current Meds  Medication Sig  . allopurinol (ZYLOPRIM) 300 MG tablet Take 300 mg by mouth daily.  [name] atorvastatin (LIPITOR) 80 MG tablet TAKE 1 TABLET BY MOUTH  EVERY EVENING  . Flaxseed, Linseed, (FLAXSEED OIL) 1200 MG CAPS Take 1,200 mg by mouth 2 (two) times daily.  . flecainide (TAMBOCOR) 100 MG tablet TAKE 1 TABLET BY MOUTH TWO  TIMES DAILY  . fluticasone (FLONASE) 50 MCG/ACT nasal spray Place 2 sprays into both nostrils daily.  [name] lisinopril-hydrochlorothiazide (PRINZIDE,ZESTORETIC) 20-25 MG per tablet Take 1 tablet by mouth every morning.  . Magnesium Carbonate POWD 350 mg daily. At bedtime  . Methylsulfonylmethane (MSM PO) Take 1 capsule by mouth 2 (two) times daily.  . metoprolol succinate (TOPROL-XL) 50 MG 24 hr tablet TAKE 1 TABLET BY MOUTH  DAILY WITH OR IMMEDIATELY  FOLLOWING A MEAL  . Vitamin D, Ergocalciferol, (DRISDOL) 1.25 MG (50000 UT) CAPS capsule 1 tab per week  . XARELTO 20 MG TABS tablet TAKE 1 TABLET BY MOUTH  DAILY WITH SUPPER     Allergies:   Patient has no known allergies.   Social History   Tobacco Use  . Smoking status: Former Smoker    Last attempt to quit: 01/29/1993    Years since quitting: 26.1  . Smokeless tobacco: Never Used  Substance Use Topics  . Alcohol use: No    Alcohol/week: 0.0 standard drinks  . Drug use: No     Family Hx: The patient's family history includes Heart failure (age of onset: 74) in his father and mother.  ROS:   Please see the history of present illness.     All other systems reviewed and are negative.   Labs/Other Tests and Data Reviewed:    Recent Labs: No results  found for requested labs within last 8760 hours.   Recent Lipid Panel Lab Results  Component Value Date/Time   CHOL 67 (L) 02/13/2018 05:00 PM   TRIG 85 02/13/2018 05:00 PM   HDL 23 (L) 02/13/2018 05:00 PM   CHOLHDL 2.9 02/13/2018 05:00 PM   CHOLHDL 7.0 02/17/2015 05:00 AM   LDLCALC 27 02/13/2018 05:00 PM    Wt Readings from Last 3 Encounters:  03/03/19 286 lb (129.7 kg)  05/07/18 296 lb (134.3 kg)  05/02/18 294 lb 6.4 oz (133.5 kg)     Objective:    Vital Signs:  BP 124/72   Pulse 65   Ht  (1.854 m)   Wt 286 lb (129.7 kg)   BMI 37.73 kg/m     ASSESSMENT & PLAN:  1.  OSA - the patient is tolerating PAP therapy well without any problems. The PAP download was reviewed today and showed an AHI of 0.4/hr on 13 cm H2O with 100% compliance in using more than 4 hours nightly.  The patient has been using and benefiting from PAP use and will continue to benefit from therapy.   2.  ASCAD - cath 2016 showed nonobstructive ASCAD.  He has not had any anginal symptoms.  He will continue on BB and statin.  He is not on an ASA due to DOAC.   3.  Hypertension - His BP is well controlled.  He will continue on Lisinopril-HCT 20-25mg  daily and Toprol XL 50mg  daily.    4.  Hyperlipidemia - his LDL goal is < 70.  His LDL was 27 a year ago.  He will continue on atorvastatin 80mg  daily.  I will get a copy of his last lipids from work.    5.  Persistent atrial fibrillation - He thinks he is in NSR and HR is well controlled.  He has started on Magnesium suppl and his PAF has significantly decreased.  He will continue on Flecainide 100mg  BID, Toprol XL 50mg  daily and Xarelto 20mg  daily. He just had blood work done at his work which I will get a copy of.  He has not had any problems with bleeding.    6.  COVID-19 Education:The signs and symptoms of COVID-19 were discussed with the patient and how to seek care for testing (follow up with PCP or arrange E-visit).  The importance of social distancing  was discussed today.  Patient Risk:   After full review of this patient's clinical status, I feel that they are at least moderate risk at this time.  Time:   Today, I have spent 15 minutes directly with the patient on vidoe discussing medical problems including CAD, HTN, lipids, PAF and OSA.  We also reviewed the symptoms of COVID 19 and the ways to protect against contracting the virus with telehealth technology.  I spent an additional 5 minutes reviewing patient's chart including PAP compliance report and labs.  Medication Adjustments/Labs and Tests Ordered: Current medicines are reviewed at length with the patient today.  Concerns regarding medicines are outlined above.  Tests Ordered: No orders of the defined types were placed in this encounter.  Medication Changes: No orders of the defined types were placed in this encounter.   Disposition:  Follow up in 1 year(s)  Signed, Armanda Magic, MD  03/03/2019 8:37 AM    [facility]

## 2019-03-03 NOTE — Patient Instructions (Addendum)
Medication Instructions:   If you need a refill on your cardiac medications before your next appointment, please call your pharmacy.   Lab work:  If you have labs (blood work) drawn today and your tests are completely normal, you will receive your results only by: . MyChart Message (if you have MyChart) OR . A paper copy in the mail If you have any lab test that is abnormal or we need to change your treatment, we will call you to review the results.  Testing/Procedures: None ordered today.  Follow-Up: At [facility], you and your health needs are our priority.  As part of our continuing mission to provide you with exceptional heart care, we have created designated Provider Care Teams.  These Care Teams include your primary Cardiologist (physician) and Advanced Practice Providers (APPs -  Physician Assistants and Nurse Practitioners) who all work together to provide you with the care you need, when you need it. You will need a follow up appointment in 12 months.  Please call our office 2 months in advance to schedule this appointment.  You may see Dr. Turner or one of the following Advanced Practice Providers on your designated Care Team:   Brittainy Simmons, PA-C Dayna Dunn, PA-C . Michele Lenze, PA-C   

## 2019-06-24 DIAGNOSIS — I10 Essential (primary) hypertension: Secondary | ICD-10-CM | POA: Diagnosis not present

## 2019-06-24 DIAGNOSIS — Z79899 Other long term (current) drug therapy: Secondary | ICD-10-CM | POA: Diagnosis not present

## 2019-06-24 DIAGNOSIS — E782 Mixed hyperlipidemia: Secondary | ICD-10-CM | POA: Diagnosis not present

## 2019-06-24 DIAGNOSIS — Z139 Encounter for screening, unspecified: Secondary | ICD-10-CM | POA: Diagnosis not present

## 2019-09-22 DIAGNOSIS — N200 Calculus of kidney: Secondary | ICD-10-CM | POA: Diagnosis not present

## 2019-09-22 DIAGNOSIS — E790 Hyperuricemia without signs of inflammatory arthritis and tophaceous disease: Secondary | ICD-10-CM | POA: Diagnosis not present

## 2019-10-15 DIAGNOSIS — T5491XA Toxic effect of unspecified corrosive substance, accidental (unintentional), initial encounter: Secondary | ICD-10-CM | POA: Diagnosis not present

## 2020-02-20 ENCOUNTER — Other Ambulatory Visit: Payer: Self-pay | Admitting: Cardiology

## 2020-02-23 NOTE — Telephone Encounter (Signed)
Prescription refill request for Xarelto received.   Last office visit: 03/03/2019, Telemedicine Weight:129.7 kg Age: 59 y.o. Scr: 0.89, 12/31/2018 CrCl:   Pt is overdue for blood work. Called and spoke to pt who stated that he got blood drawn yesterday at work. Pt stated that the nurse at his work was not there today but he would ask her what he got drawn and could fax them over. Per result notes (12/31/2018) and per pt he had lab drawn in August as well that was drawn at work. Informed pt to give [place] a call once he finds out what he had drawn. Gave him the number to the anticoagulation clinic [phone] and our fax number [phone]. Pt stated he just started his last refill bottle of Xarelto.

## 2020-02-29 NOTE — Telephone Encounter (Signed)
Called and spoke to pt, who stated that he has not had a chance to see the nurse from work. Pt stated that he should be seeing her on 5/19 and she will be going over the results of his lab work. Informed to give [place] a call when he gets the results of the lab work and gave him the number to the coumadin clinic and our fax number. Pt stated he has enough Xarelto to get him past 5/19.

## 2020-03-10 ENCOUNTER — Other Ambulatory Visit: Payer: Self-pay

## 2020-03-15 NOTE — Telephone Encounter (Signed)
CrCl: 161 ml/min   Pt stated that he is going to call and schedule an appointment to see Dr. Mayford Knife. He also stated that he asked the nurse from his work to fax [place] the blood work for last week. Informed him that we have not gotten the blood work. He stated that he will see the nurse tomorrow. Informed him that she can give [place] a call at [phone] to make sure we get the labs.

## 2020-03-16 NOTE — Telephone Encounter (Signed)
Pt was due to have labs drawn today per message from yesterday, if drawn today they may result by tomorrow therefore, will need to follow up tomorrow.

## 2020-03-17 NOTE — Telephone Encounter (Signed)
Spoke to [place] and received pt's labs from work.  Scr: 0.83, 02/22/2020 Weight: 129.7 kg Age: 59 yo CrCl: 173 ml/min   Pt knows that he needs to schedule follow up appointment with Dr. Mayford Knife and stated he will call to schedule an appointment. Will send in refill so pt dose not run out of Xarelto.   Prescription refill sent.

## 2020-03-17 NOTE — Telephone Encounter (Signed)
Called and spoke to pt. He stated that he spoke with the occupational nurse, Elnita Maxwell (sp?), and let her know that we had not received the blood work. She informed pt that she would resend the blood work. Pt gave me Cheryl's number [phone]) to call to see if we could get blood work. Called [place] [facility] for her to give the coumadin clinic a call back.   Pt stated that he would call the office later on today to schedule an office visit because he was at currently at work.

## 2020-05-07 ENCOUNTER — Other Ambulatory Visit: Payer: Self-pay | Admitting: Cardiology

## 2020-05-09 ENCOUNTER — Other Ambulatory Visit: Payer: Self-pay

## 2020-05-09 MED ORDER — FLECAINIDE ACETATE 100 MG PO TABS: 100.0000 mg | ORAL_TABLET | Freq: Two times a day (BID) | ORAL | 0 refills | Status: DC

## 2020-05-09 MED ORDER — METOPROLOL SUCCINATE ER 50 MG PO TB24: ORAL_TABLET | ORAL | 0 refills | Status: DC

## 2020-05-09 MED ORDER — ATORVASTATIN CALCIUM 80 MG PO TABS: 80.0000 mg | ORAL_TABLET | Freq: Every evening | ORAL | 0 refills | Status: DC

## 2020-05-09 NOTE — Telephone Encounter (Signed)
Prescription refill request for Xarelto received.   Last office visit: Turner, 03/03/2019 Weight: 129.7 kg Age: 59 y.o. Scr: 0.83, 03/25/2020 CrCl: 171 ml/min  Pt has an appointment scheduled to see Dr. Mayford Knife  On August 23,2021.   Prescription refill sent.

## 2020-05-09 NOTE — Telephone Encounter (Signed)
Pt's medications were sent to pt's pharmacy as requested. Confirmation received.  

## 2020-06-12 NOTE — Progress Notes (Signed)
Date:  06/13/2020   ID:  Christopher Phillips, DOB 1960/12/26, MRN [id number]  Patient Location:  Home  Provider location:   [facility]  PCP:  Patient, No Pcp Per  Cardiologist:  Armanda Magic, MD Electrophysiologist:  None   Chief Complaint:  Atrial fibrillation, CAD, OSA, HTN  History of Present Illness:    Christopher Phillips is a 59 y.o. 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    He is here today for followup and is doing well.  He denies any chest pain or pressure, SOB, DOE, PND, orthopnea, LE edema, dizziness,or syncope. He tells me that a few times a year he will notice his heart go out of rhythm and then is resolves a little later.  Today he felt fine and was walking up the stairs and he felt it go out of rhythm.  He is compliant with his meds and is tolerating meds with no SE.    He is doing well with his CPAP device and thinks that he has gotten used to it.  He tolerates the mask and feels the pressure is adequate.  Since going on CPAP he feels rested in the am and has no significant daytime sleepiness.  He denies any significant mouth or nasal dryness or nasal congestion.  He does not think that he snores.     Prior CV studies:   The following studies were reviewed today:  PAP compliance download, EKG and labs  Past Medical History:  Diagnosis Date  . CAD (coronary artery disease)    a. nonobstructive by cath 01/2015.  [name] Complication of anesthesia    slow to wake up  . Diabetes mellitus (HCC)    a. A1c 6.5 in 01/2015.  [name] History of kidney stones   . Hyperlipidemia   . Hypertension   . Morbid obesity (HCC)   . OSA on CPAP    severe with AHI 24.84/hr now on CPAP at 13cm H2O  . Persistent atrial fibrillation (HCC)    a. Started on flecainide - developed AF RVR with GXT. Increased to 100mg  BID, started on Xarelto. CHADSVASC = 3  (HTN , DM and  nonobstructive CAD)   Past Surgical History:  Procedure Laterality Date  . CYSTOSCOPY WITH URETEROSCOPY Right 02/08/2014   Procedure: Cystoscopy Left retrograde pyleogram, right antegrade pyleogram;  Surgeon: 02/10/2014, MD;  Location: [facility];  Service: Urology;  Laterality: Right;  . KIDNEY STONE SURGERY     20 YRS AGO  . KNEE SURGERY     BILATERAL  . LEFT HEART CATHETERIZATION WITH CORONARY ANGIOGRAM N/A 02/17/2015   Procedure: LEFT HEART CATHETERIZATION WITH CORONARY ANGIOGRAM;  Surgeon: 02/19/2015, MD;  [facility] CATH LAB;  Service: Cardiovascular;  Laterality: N/A;  . NEPHROLITHOTOMY Right 02/08/2014   Procedure: NEPHROLITHOTOMY PERCUTANEOUS;  Surgeon: 02/10/2014, MD;  Location: [facility];  Service: Urology;  Laterality: Right;  1ST STAGE (RT) PCNL      . NEPHROLITHOTOMY Right 02/10/2014   Procedure: RIGHT NEPHROLITHOTOMY PERCUTANEOUS SECOND LOOK;  Surgeon: 02/12/2014, MD;  Location: [facility];  Service: Urology;  Laterality: Right;  2ND STAGE (RT) PCNL      Current Meds  Medication Sig  . allopurinol (ZYLOPRIM) 300 MG tablet Take 300 mg by mouth daily.  Milford Cage atorvastatin (LIPITOR) 80 MG tablet Take 1 tablet (80 mg total) by mouth  every evening. Please keep upcoming appt in August before anymore refills. Thank you  . Flaxseed, Linseed, (FLAXSEED OIL) 1200 MG CAPS Take 1,200 mg by mouth 2 (two) times daily.  . flecainide (TAMBOCOR) 100 MG tablet Take 1 tablet (100 mg total) by mouth 2 (two) times daily. Please keep upcoming appt in August with Dr. Mayford Knife before anymore refills. Thank you  . fluticasone (FLONASE) 50 MCG/ACT nasal spray Place 2 sprays into both nostrils daily.  [name] lisinopril-hydrochlorothiazide (ZESTORETIC) 20-25 MG tablet Take 1 tablet by mouth every morning. Please make overdue appt with Dr. Mayford Knife before anymore refills. 1st attempt  . Magnesium Carbonate POWD 350 mg daily. At bedtime  . metoprolol succinate (TOPROL-XL) 50 MG 24  hr tablet TAKE 1 TABLET BY MOUTH  DAILY WITH OR IMMEDIATELY  FOLLOWING A MEAL. Please keep upcoming appt with Dr. Mayford Knife in August. Thank you  . Vitamin D, Ergocalciferol, (DRISDOL) 1.25 MG (50000 UT) CAPS capsule 1 tab per week  . XARELTO 20 MG TABS tablet TAKE 1 TABLET BY MOUTH  DAILY WITH SUPPER     Allergies:   Patient has no known allergies.   Social History   Tobacco Use  . Smoking status: Former Smoker    Quit date: 01/29/1993    Years since quitting: 27.3  . Smokeless tobacco: Never Used  Vaping Use  . Vaping Use: Never used  Substance Use Topics  . Alcohol use: No    Alcohol/week: 0.0 standard drinks  . Drug use: No     Family Hx: The patient's family history includes Heart failure (age of onset: 54) in his father and mother.  ROS:   Please see the history of present illness.     All other systems reviewed and are negative.   Labs/Other Tests and Data Reviewed:    Recent Labs: No results found for requested labs within last 8760 hours.   Recent Lipid Panel Lab Results  Component Value Date/Time   CHOL 67 (L) 02/13/2018 05:00 PM   TRIG 85 02/13/2018 05:00 PM   HDL 23 (L) 02/13/2018 05:00 PM   CHOLHDL 2.9 02/13/2018 05:00 PM   CHOLHDL 7.0 02/17/2015 05:00 AM   LDLCALC 27 02/13/2018 05:00 PM    Wt Readings from Last 3 Encounters:  06/13/20 290 lb (131.5 kg)  03/03/19 286 lb (129.7 kg)  05/07/18 296 lb (134.3 kg)     Objective:    Vital Signs:  BP 110/72   Pulse 72   Ht 6\' 1"  (1.854 m)   Wt 290 lb (131.5 kg)   SpO2 96%   BMI 38.26 kg/m    GEN: Well nourished, well developed in no acute distress HEENT: Normal NECK: No JVD; No carotid bruits LYMPHATICS: No lymphadenopathy CARDIAC:irregularly irregular, no murmurs, rubs, gallops RESPIRATORY:  Clear to auscultation without rales, wheezing or rhonchi  ABDOMEN: Soft, non-tender, non-distended MUSCULOSKELETAL:  No edema; No deformity  SKIN: Warm and dry NEUROLOGIC:  Alert and oriented x  3 PSYCHIATRIC:  Normal affect   EKG was done today and shows atrial fibrillation at 72bpm with CVR  ASSESSMENT & PLAN:    1. OSA - The patient is tolerating PAP therapy well without any problems. The PAP download was reviewed today and showed an AHI of 0.4/hr on 13 cm H2O with 100% compliance in using more than 4 hours nightly.  The patient has been using and benefiting from PAP use and will continue to benefit from therapy.   2.  ASCAD  -cath 2016 showed  nonobstructive ASCAD.   -he denies any anginal symptoms -He will continue on BB and statin.  -no ASA due to DOAC  3.  Hypertension  -BP controlled -continue Lisinopril HCT 20-25mg  daily and Toprol XL 50mg  daily -SCr was done at work in May 2021 and was 0.83  4.  Hyperlipidemia -his LDL goal is < 70.  -LDL was at goal 43 and HDL low at 27 in May 2021 -continue Atorvastatin 80mg  daily  5.  Persistent atrial fibrillation - -he is back in afib today.  He occasionally will feel his heart go out of rhythm but then it resolves -no bleeding problems on DOAC>>Hbg 15.8 in June 2021 -continue Flecainide 100mg  BID, Toprol XL 50mg  daily and Xarelto 20mg  daily -I have asked him to call me tomorrow to let me know if he went back into afib  Medication Adjustments/Labs and Tests Ordered: Current medicines are reviewed at length with the patient today.  Concerns regarding medicines are outlined above.  Tests Ordered: Orders Placed This Encounter  Procedures  . EKG 12-Lead   Medication Changes: No orders of the defined types were placed in this encounter.   Disposition:  Follow up in 1 year(s)  Signed, , MD  06/13/2020 3:49 PM    [facility]

## 2020-06-13 ENCOUNTER — Other Ambulatory Visit: Payer: Self-pay

## 2020-06-13 ENCOUNTER — Encounter: Payer: Self-pay | Admitting: Cardiology

## 2020-06-13 ENCOUNTER — Ambulatory Visit: Payer: BC Managed Care – PPO | Admitting: Cardiology

## 2020-06-13 VITALS — BP 110/72 | HR 72 | Ht 73.0 in | Wt 290.0 lb

## 2020-06-13 DIAGNOSIS — Z9989 Dependence on other enabling machines and devices: Secondary | ICD-10-CM

## 2020-06-13 DIAGNOSIS — G4733 Obstructive sleep apnea (adult) (pediatric): Secondary | ICD-10-CM

## 2020-06-13 DIAGNOSIS — I10 Essential (primary) hypertension: Secondary | ICD-10-CM | POA: Diagnosis not present

## 2020-06-13 DIAGNOSIS — I4819 Other persistent atrial fibrillation: Secondary | ICD-10-CM

## 2020-06-13 DIAGNOSIS — E7800 Pure hypercholesterolemia, unspecified: Secondary | ICD-10-CM | POA: Diagnosis not present

## 2020-06-13 DIAGNOSIS — I2510 Atherosclerotic heart disease of native coronary artery without angina pectoris: Secondary | ICD-10-CM

## 2020-06-13 NOTE — Patient Instructions (Signed)

## 2020-06-14 ENCOUNTER — Telehealth: Payer: Self-pay | Admitting: Cardiology

## 2020-06-14 NOTE — Telephone Encounter (Signed)
Patient called to let Dr. Mayford Knife know that he went back in to sinus rhythm at 6:15pm last night with his BP 118/70 and his HR 65.

## 2020-06-14 NOTE — Telephone Encounter (Signed)
Great. Thank you.

## 2020-07-03 ENCOUNTER — Other Ambulatory Visit: Payer: Self-pay | Admitting: Cardiology

## 2020-07-31 ENCOUNTER — Other Ambulatory Visit: Payer: Self-pay | Admitting: Cardiology

## 2020-07-31 DIAGNOSIS — I4819 Other persistent atrial fibrillation: Secondary | ICD-10-CM

## 2020-08-01 NOTE — Telephone Encounter (Signed)
Prescription refill request for Xarelto received.  Indication: A Fib Last office visit: 06/13/20 Weight: 131kg Age: 59 Scr: 0.83 CrCl: .min

## 2020-08-02 ENCOUNTER — Other Ambulatory Visit: Payer: Self-pay

## 2020-08-02 MED ORDER — LISINOPRIL-HYDROCHLOROTHIAZIDE 20-25 MG PO TABS: 1.0000 | ORAL_TABLET | Freq: Every morning | ORAL | 3 refills | Status: DC

## 2020-10-12 DIAGNOSIS — N200 Calculus of kidney: Secondary | ICD-10-CM | POA: Diagnosis not present

## 2020-10-12 DIAGNOSIS — E790 Hyperuricemia without signs of inflammatory arthritis and tophaceous disease: Secondary | ICD-10-CM | POA: Diagnosis not present

## 2021-06-06 ENCOUNTER — Other Ambulatory Visit: Payer: Self-pay | Admitting: Cardiology

## 2021-06-09 ENCOUNTER — Telehealth: Payer: Self-pay | Admitting: Cardiology

## 2021-06-09 DIAGNOSIS — Z9989 Dependence on other enabling machines and devices: Secondary | ICD-10-CM

## 2021-06-09 DIAGNOSIS — G4733 Obstructive sleep apnea (adult) (pediatric): Secondary | ICD-10-CM

## 2021-06-09 NOTE — Telephone Encounter (Signed)
  What problem are you experiencing? CPAP machine states "motor life exceeded" and requests patient to contact the provider.  Who is your medical equipment company? Thinks is it [employer] or [employer]  Patient's wife requests the call back go to the patient at: [phone]    Please route to the sleep study assistant.

## 2021-06-12 NOTE — Telephone Encounter (Signed)
Patient would like a new Rx for a new cpap

## 2021-06-12 NOTE — Telephone Encounter (Signed)
Follow up:    Patient returning a call back for f/u

## 2021-06-13 NOTE — Telephone Encounter (Signed)
Order placed to [employer] via community message. 

## 2021-06-30 NOTE — Telephone Encounter (Signed)
Called and made patient aware that [employer] will get his CPAP stuff taken care of after his appointment with Dr. Mayford Knife on 10/18. Patient verbalized understanding and thanked me for the call.

## 2021-06-30 NOTE — Telephone Encounter (Signed)
From: Kathyrn Sheriff  Sent: 06/19/2021   3:42 PM EDT  To: Kathyrn Sheriff, Reesa Chew, CMA  Subject: RE: Replacement Cpap                           Hello Dorothea,   Noted,  I will push this out until that time.   Thank you,   Brad New   ----- Message -----  From: Reesa Chew, CMA  Sent: 06/19/2021  10:05 AM EDT  To: Elige Radon New  Subject: RE: Replacement Cpap                           Dorann Lodge, he does not have an appointment until 08/08/21. That's the soonest we can get him in.  Thanks,  Coralee North  ----- Message -----  From: Kathyrn Sheriff  Sent: 06/16/2021  10:54 AM EDT  To: Henderson Newcomer, Judie Petit, *  Subject: RE: Replacement Cpap                           Hello Apolinar Junes,   I received the order for new cpap. However, we will need the following items to process this order:  1. f42f notes showing patient benefits from the cpap unit and usage.   All the note that work for this are over a year old and can not be used and all the new notes do not talk about usage or benefit. I am placing the order on hold until we can obtain this info.   Thank you,   Brad New   ----- Message -----

## 2021-07-08 ENCOUNTER — Other Ambulatory Visit: Payer: Self-pay | Admitting: Cardiology

## 2021-07-08 DIAGNOSIS — I4819 Other persistent atrial fibrillation: Secondary | ICD-10-CM

## 2021-07-10 NOTE — Telephone Encounter (Addendum)
Prescription refill request for Xarelto received.  Indication: afib  Last office visit: 06/13/2020 Weight: 131.5 kg  Age: 60 yo  Scr: 0.83 03/09/2020 CrCl: 171 ml/min   Overdue to see Dr. Shirline Frees to see Dr. Mayford Knife in October, 2022. Put on appointment note for pt get labs at office visit.

## 2021-07-17 DIAGNOSIS — L570 Actinic keratosis: Secondary | ICD-10-CM | POA: Diagnosis not present

## 2021-07-17 DIAGNOSIS — L918 Other hypertrophic disorders of the skin: Secondary | ICD-10-CM | POA: Diagnosis not present

## 2021-07-17 DIAGNOSIS — X32XXXA Exposure to sunlight, initial encounter: Secondary | ICD-10-CM | POA: Diagnosis not present

## 2021-07-17 DIAGNOSIS — D2362 Other benign neoplasm of skin of left upper limb, including shoulder: Secondary | ICD-10-CM | POA: Diagnosis not present

## 2021-08-08 ENCOUNTER — Telehealth: Payer: Self-pay | Admitting: *Deleted

## 2021-08-08 ENCOUNTER — Other Ambulatory Visit: Payer: Self-pay

## 2021-08-08 ENCOUNTER — Encounter: Payer: Self-pay | Admitting: Cardiology

## 2021-08-08 ENCOUNTER — Ambulatory Visit: Payer: BC Managed Care – PPO | Admitting: Cardiology

## 2021-08-08 VITALS — BP 120/78 | HR 88 | Ht 73.0 in | Wt 295.0 lb

## 2021-08-08 DIAGNOSIS — Z9989 Dependence on other enabling machines and devices: Secondary | ICD-10-CM

## 2021-08-08 DIAGNOSIS — G4733 Obstructive sleep apnea (adult) (pediatric): Secondary | ICD-10-CM

## 2021-08-08 DIAGNOSIS — E7800 Pure hypercholesterolemia, unspecified: Secondary | ICD-10-CM | POA: Diagnosis not present

## 2021-08-08 DIAGNOSIS — I2510 Atherosclerotic heart disease of native coronary artery without angina pectoris: Secondary | ICD-10-CM | POA: Diagnosis not present

## 2021-08-08 DIAGNOSIS — I10 Essential (primary) hypertension: Secondary | ICD-10-CM | POA: Diagnosis not present

## 2021-08-08 DIAGNOSIS — I4819 Other persistent atrial fibrillation: Secondary | ICD-10-CM | POA: Diagnosis not present

## 2021-08-08 MED ORDER — METOPROLOL SUCCINATE ER 50 MG PO TB24: ORAL_TABLET | ORAL | 3 refills | Status: DC

## 2021-08-08 MED ORDER — RIVAROXABAN 20 MG PO TABS: 20.0000 mg | ORAL_TABLET | Freq: Every day | ORAL | 0 refills | Status: DC

## 2021-08-08 MED ORDER — LISINOPRIL-HYDROCHLOROTHIAZIDE 20-25 MG PO TABS: 1.0000 | ORAL_TABLET | Freq: Every morning | ORAL | 3 refills | Status: DC

## 2021-08-08 MED ORDER — FLECAINIDE ACETATE 100 MG PO TABS: 100.0000 mg | ORAL_TABLET | Freq: Two times a day (BID) | ORAL | 3 refills | Status: DC

## 2021-08-08 MED ORDER — ATORVASTATIN CALCIUM 80 MG PO TABS
80.0000 mg | ORAL_TABLET | Freq: Every evening | ORAL | 3 refills | Status: DC
Start: 2021-08-08 — End: 2021-09-22

## 2021-08-08 NOTE — Progress Notes (Signed)
Date:  08/08/2021   ID:  [name], DOB [birth year], MRN [id number]  Patient Location:  Home  Provider location:   [facility]  PCP:  Patient, No Pcp Per (Inactive)  Cardiologist:  Armanda Magic, MD Electrophysiologist:  None   Chief Complaint:  Atrial fibrillation, CAD, OSA, HTN  History of Present Illness:    Christopher Phillips is a 60 y.o. 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    He is here today for followup and is doing well.  He denies any chest pain or pressure, SOB, DOE, PND, orthopnea, LE edema, dizziness,or syncope. He has no palpitations at home but when he goes to the MDs office he gets nervous and goes into afib.  It will only last a few hours and when he gets home he converts back to NSR>   He is doing well with his CPAP device and thinks that he has gotten used to it.  He tolerates the mask but feels the pressure is not as good as it used to be.   Since going on CPAP he feels rested in the am and has no significant daytime sleepiness.  He denies any significant mouth or nasal dryness or nasal congestion.  He does not think that he snores.  He needs a new device as his motor is end of life.   Prior CV studies:   The following studies were reviewed today:  PAP compliance download, EKG and labs  Past Medical History:  Diagnosis Date   CAD (coronary artery disease)    a. nonobstructive by cath 01/2015.   Complication of anesthesia    slow to wake up   Diabetes mellitus (HCC)    a. A1c 6.5 in 01/2015.   History of kidney stones    Hyperlipidemia    Hypertension    Morbid obesity (HCC)    OSA on CPAP    severe with AHI 24.84/hr now on CPAP at 13cm H2O   Persistent atrial fibrillation (HCC)    a. Started on flecainide - developed AF RVR with GXT. Increased to 100mg  BID, started on Xarelto. CHADSVASC = 3  (HTN , DM and nonobstructive CAD)    Past Surgical History:  Procedure Laterality Date   CYSTOSCOPY WITH URETEROSCOPY Right 02/08/2014   Procedure: Cystoscopy Left retrograde pyleogram, right antegrade pyleogram;  Surgeon: 02/10/2014, MD;  Location: [facility];  Service: Urology;  Laterality: Right;   KIDNEY STONE SURGERY     20 YRS AGO   KNEE SURGERY     BILATERAL   LEFT HEART CATHETERIZATION WITH CORONARY ANGIOGRAM N/A 02/17/2015   Procedure: LEFT HEART CATHETERIZATION WITH CORONARY ANGIOGRAM;  Surgeon: 02/19/2015, MD;  Location: [facility] CATH LAB;  Service: Cardiovascular;  Laterality: N/A;   NEPHROLITHOTOMY Right 02/08/2014   Procedure: NEPHROLITHOTOMY PERCUTANEOUS;  Surgeon: 02/10/2014, MD;  Location: [facility];  Service: Urology;  Laterality: Right;  1ST STAGE (RT) PCNL       NEPHROLITHOTOMY Right 02/10/2014   Procedure: RIGHT NEPHROLITHOTOMY PERCUTANEOUS SECOND LOOK;  Surgeon: 02/12/2014, MD;  Location: [facility];  Service: Urology;  Laterality: Right;  2ND STAGE (RT) PCNL      Current Meds  Medication Sig   allopurinol (ZYLOPRIM) 300 MG tablet Take 300 mg by mouth daily.   atorvastatin (LIPITOR) 80 MG tablet TAKE 1 TABLET BY MOUTH  EVERY EVENING  Flaxseed, Linseed, (FLAXSEED OIL) 1200 MG CAPS Take 1,200 mg by mouth 2 (two) times daily.   flecainide (TAMBOCOR) 100 MG tablet TAKE 1 TABLET BY MOUTH  TWICE DAILY   fluticasone (FLONASE) 50 MCG/ACT nasal spray Place 2 sprays into both nostrils daily.   lisinopril-hydrochlorothiazide (ZESTORETIC) 20-25 MG tablet TAKE 1 TABLET BY MOUTH IN  THE MORNING   Magnesium Carbonate POWD 350 mg daily. At bedtime   metoprolol succinate (TOPROL-XL) 50 MG 24 hr tablet TAKE 1 TABLET BY MOUTH  DAILY WITH OR IMMEDIATELY  FOLLOWING A MEAL   rivaroxaban (XARELTO) 20 MG TABS tablet TAKE 1 TABLET BY MOUTH  DAILY WITH SUPPER   Vitamin D, Ergocalciferol, (DRISDOL) 1.25 MG (50000 UT) CAPS capsule 1 tab per week     Allergies:   Patient has no known allergies.    Social History   Tobacco Use   Smoking status: Former    Types: Cigarettes    Quit date: 01/29/1993    Years since quitting: 28.5   Smokeless tobacco: Never  Vaping Use   Vaping Use: Never used  Substance Use Topics   Alcohol use: No    Alcohol/week: 0.0 standard drinks   Drug use: No     Family Hx: The patient's family history includes Heart failure (age of onset: 60) in his father and mother.  ROS:   Please see the history of present illness.     All other systems reviewed and are negative.   Labs/Other Tests and Data Reviewed:    Recent Labs: No results found for requested labs within last 8760 hours.   Recent Lipid Panel Lab Results  Component Value Date/Time   CHOL 67 (L) 02/13/2018 05:00 PM   TRIG 85 02/13/2018 05:00 PM   HDL 23 (L) 02/13/2018 05:00 PM   CHOLHDL 2.9 02/13/2018 05:00 PM   CHOLHDL 7.0 02/17/2015 05:00 AM   LDLCALC 27 02/13/2018 05:00 PM    Wt Readings from Last 3 Encounters:  08/08/21 295 lb (133.8 kg)  06/13/20 290 lb (131.5 kg)  03/03/19 286 lb (129.7 kg)     Objective:    Vital Signs:  BP 120/78   Pulse 88   Ht 6\' 1"  (1.854 m)   Wt 295 lb (133.8 kg)   SpO2 98%   BMI 38.92 kg/m    GEN: Well nourished, well developed in no acute distress HEENT: Normal NECK: No JVD; No carotid bruits LYMPHATICS: No lymphadenopathy CARDIAC:irregularly irregular, no murmurs, rubs, gallops RESPIRATORY:  Clear to auscultation without rales, wheezing or rhonchi  ABDOMEN: Soft, non-tender, non-distended MUSCULOSKELETAL:  No edema; No deformity  SKIN: Warm and dry NEUROLOGIC:  Alert and oriented x 3 PSYCHIATRIC:  Normal affect   EKG was done today and shows atrial fibrillation with nonspecific T wave abnormality  1. OSA - The patient is tolerating PAP therapy well without any problems. The PAP download performed by his DME was personally reviewed and interpreted by me today and showed an AHI of 0.3/hr on 13 cm H2O with 100% compliance in using  more than 4 hours nightly.  The patient has been using and benefiting from PAP use and will continue to benefit from therapy.  -I will order a new [employer] CPAP at 13cm H2o with heated humidity and mask of choice   2.  ASCAD  -cath 2016 showed nonobstructive ASCAD.   -he has not had any anginal symptoms -continue on BB and statin -no ASA due to DOAC  3.  Hypertension  -Bp controlled  on exam today -continue prescription Lisinopril HCT 20-25mg  daily and Toprol XL 50mg  daily>refilled  4.  Hyperlipidemia -LDL goal < 70 -continue prescription drug management with Atorvastatin 80mg  daily>refilled  5.  Persistent atrial fibrillation - -he is maintaining NSR on exam today -continue prescription drug management with flecainide 100 mg twice daily, Toprol-XL 50 mg daily, Xarelto 20 mg daily >refilled  Medication Adjustments/Labs and Tests Ordered: Current medicines are reviewed at length with the patient today.  Concerns regarding medicines are outlined above.  Tests Ordered: Orders Placed This Encounter  Procedures   EKG 12-Lead    Medication Changes: No orders of the defined types were placed in this encounter.   Disposition:  Follow up in 1 year(s)  Signed, , MD  08/08/2021 2:16 PM    [facility]

## 2021-08-08 NOTE — Addendum Note (Signed)
Addended by: Theresia Majors on: 08/08/2021 02:20 PM   Modules accepted: Orders

## 2021-08-08 NOTE — Telephone Encounter (Signed)
Per dr Nadeen Landau a new [employer] CPAP at 13cm H2o with heated humidity and mask of choice

## 2021-08-08 NOTE — Telephone Encounter (Signed)
Order placed to [employer] via community message 

## 2021-08-08 NOTE — Patient Instructions (Signed)

## 2021-09-04 DIAGNOSIS — G4733 Obstructive sleep apnea (adult) (pediatric): Secondary | ICD-10-CM | POA: Diagnosis not present

## 2021-09-22 ENCOUNTER — Other Ambulatory Visit: Payer: Self-pay

## 2021-09-22 DIAGNOSIS — I4819 Other persistent atrial fibrillation: Secondary | ICD-10-CM

## 2021-09-22 MED ORDER — RIVAROXABAN 20 MG PO TABS: 20.0000 mg | ORAL_TABLET | Freq: Every day | ORAL | 3 refills | Status: DC

## 2021-09-22 MED ORDER — METOPROLOL SUCCINATE ER 50 MG PO TB24: ORAL_TABLET | ORAL | 3 refills | Status: DC

## 2021-09-22 MED ORDER — ATORVASTATIN CALCIUM 80 MG PO TABS: 80.0000 mg | ORAL_TABLET | Freq: Every evening | ORAL | 3 refills | Status: DC

## 2021-09-22 MED ORDER — FLECAINIDE ACETATE 100 MG PO TABS: 100.0000 mg | ORAL_TABLET | Freq: Two times a day (BID) | ORAL | 3 refills | Status: DC

## 2021-09-22 MED ORDER — LISINOPRIL-HYDROCHLOROTHIAZIDE 20-25 MG PO TABS: 1.0000 | ORAL_TABLET | Freq: Every morning | ORAL | 3 refills | Status: DC

## 2021-09-22 NOTE — Telephone Encounter (Signed)
Prescription refill request for Xarelto received.  Indication: Afib  Last office visit:08/08/21 Mayford Knife)  Weight:133.8kg Age:60 Scr: 0.81 (07/27/21)  CrCl: 178.81ml/min  Appropriate dose and refill sent to requested pharmacy.

## 2021-09-28 ENCOUNTER — Telehealth: Payer: Self-pay | Admitting: Cardiology

## 2021-09-28 NOTE — Telephone Encounter (Signed)
Called patient to schedule sleep compliance appointment. Patient states that he does not need this appointment since he has been on PAP therapy for a long time. He states he spoke to his DME company yesterday and they thought he was a new patient. He states when he told them he was not that no compliance appointment was needed. He did get a new machine though. Just FYI.

## 2021-10-04 DIAGNOSIS — G4733 Obstructive sleep apnea (adult) (pediatric): Secondary | ICD-10-CM | POA: Diagnosis not present

## 2021-10-24 DIAGNOSIS — Z87442 Personal history of urinary calculi: Secondary | ICD-10-CM | POA: Diagnosis not present

## 2021-10-24 DIAGNOSIS — N281 Cyst of kidney, acquired: Secondary | ICD-10-CM | POA: Diagnosis not present

## 2021-10-24 DIAGNOSIS — E790 Hyperuricemia without signs of inflammatory arthritis and tophaceous disease: Secondary | ICD-10-CM | POA: Diagnosis not present

## 2021-11-04 DIAGNOSIS — G4733 Obstructive sleep apnea (adult) (pediatric): Secondary | ICD-10-CM | POA: Diagnosis not present

## 2021-12-05 DIAGNOSIS — G4733 Obstructive sleep apnea (adult) (pediatric): Secondary | ICD-10-CM | POA: Diagnosis not present

## 2022-01-02 DIAGNOSIS — G4733 Obstructive sleep apnea (adult) (pediatric): Secondary | ICD-10-CM | POA: Diagnosis not present

## 2022-02-02 DIAGNOSIS — G4733 Obstructive sleep apnea (adult) (pediatric): Secondary | ICD-10-CM | POA: Diagnosis not present

## 2022-03-04 DIAGNOSIS — G4733 Obstructive sleep apnea (adult) (pediatric): Secondary | ICD-10-CM | POA: Diagnosis not present

## 2022-03-30 ENCOUNTER — Ambulatory Visit: Payer: BC Managed Care – PPO | Admitting: Nurse Practitioner

## 2022-03-30 ENCOUNTER — Encounter: Payer: Self-pay | Admitting: Nurse Practitioner

## 2022-03-30 VITALS — BP 121/72 | HR 71 | Ht 73.0 in | Wt 289.2 lb

## 2022-03-30 DIAGNOSIS — Z7689 Persons encountering health services in other specified circumstances: Secondary | ICD-10-CM

## 2022-03-30 DIAGNOSIS — M7021 Olecranon bursitis, right elbow: Secondary | ICD-10-CM

## 2022-03-30 NOTE — Assessment & Plan Note (Signed)
Establishing care today.  Completed assessment and provided education on health maintenance and preventative care.  Printed handouts given.  Patient knows to return for physical exam and labs in the future.

## 2022-03-30 NOTE — Progress Notes (Signed)
New Patient Note  RE: Christopher Phillips MRN: [id number] DOB: Sep 29, 1961 Date of Office Visit: 03/30/2022  Chief Complaint: Establish Care and Bursitis  History of Present Illness: Patient is a 61 year old male who presents to clinic to establish care with history of hyperlipidemia, hypertension, and A-fib.  Complaints of nontender right elbow bursitis and a ganglion cyst in the past few days.  Patient is afebrile.  Currently completed lab work at work and will not repeat labs today.  Elbow swelling: Patient complains of right elbow pain. Onset of the symptoms was several days ago. Inciting event: none known. Current symptoms include swelling. No pain associated with symptoms. Patient's overall course: well controlled. Patient has had no prior elbow problems. Evaluation to date: none. Treatment to date: nothing specific.   Assessment and Plan: Christopher Phillips is a 61 y.o. male with: Olecranon bursitis -Advised patient to rest elbow joint -Apply ice -Wrapping elbow with an elastic Band-Aid for compression -Keeping affected area raised.  Advised patient that if elbow becomes red /infected, draining fluid out of the bursa may be required, ultrasound, pain medicine or even antibiotic.  Patient verbalized understanding  Follow-up with worsening unresolved symptoms.  Encounter to establish care Establishing care today.  Completed assessment and provided education on health maintenance and preventative care.  Printed handouts given.  Patient knows to return for physical exam and labs in the future.  Return if symptoms worsen or fail to improve.   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 nonobstructive by cath 01/2015.   Complication of anesthesia    slow to wake up   Diabetes mellitus ([place])    a. A1c 6.5 in 01/2015.   History of kidney stones    Hyperlipidemia    Hypertension    Morbid obesity ([place])    OSA on CPAP    severe with AHI 24.84/hr now on CPAP at 13cm H2O   Persistent atrial fibrillation ([place])    a. Started on flecainide - developed AF RVR with GXT. Increased to 100mg  BID, started on Xarelto. CHADSVASC = 3  (HTN , DM and nonobstructive CAD)   Past Surgical History: Past Surgical History:  Procedure Laterality Date   CYSTOSCOPY WITH URETEROSCOPY Right 02/08/2014   Procedure: Cystoscopy Left retrograde pyleogram, right antegrade pyleogram;  Surgeon: Molli Hazard, MD;  Location: [facility];  Service: Urology;  Laterality: Right;   KIDNEY STONE SURGERY     20 YRS AGO   KNEE SURGERY     BILATERAL   LEFT HEART CATHETERIZATION WITH CORONARY ANGIOGRAM N/A 02/17/2015   Procedure: LEFT HEART CATHETERIZATION WITH CORONARY ANGIOGRAM;  Surgeon: Burnell Blanks, MD;  Location: [facility] CATH LAB;  Service: Cardiovascular;  Laterality: N/A;   NEPHROLITHOTOMY Right 02/08/2014   Procedure: NEPHROLITHOTOMY PERCUTANEOUS;  Surgeon: Molli Hazard, MD;  Location: [facility];  Service: Urology;  Laterality: Right;  1ST STAGE (RT) PCNL       NEPHROLITHOTOMY Right 02/10/2014   Procedure: RIGHT NEPHROLITHOTOMY PERCUTANEOUS SECOND LOOK;  Surgeon: Milford Cage, MD;  Location: [facility];  Service: Urology;  Laterality: Right;  2ND STAGE (RT) PCNL    Medication List:  Current Outpatient Medications  Medication Sig Dispense  Refill   atorvastatin (LIPITOR) 80 MG tablet Take 1 tablet (80 mg total) by mouth every evening. 90 tablet 3   flecainide (TAMBOCOR) 100 MG tablet Take 1 tablet (100 mg total) by mouth 2 (two) times daily. 180 tablet 3   flecainide (TAMBOCOR) 100 MG tablet 1 tablet Orally BID     lisinopril-hydrochlorothiazide (ZESTORETIC) 20-25 MG tablet Take 1 tablet by mouth every morning. 90 tablet 3   Magnesium Carbonate POWD 350 mg daily. At bedtime     metoprolol succinate (TOPROL-XL) 50 MG 24 hr tablet Take with or immediately following a meal. 90 tablet 3   rivaroxaban (XARELTO) 20 MG TABS tablet Take 1 tablet (20 mg total) by mouth daily with supper. 90 tablet 3   allopurinol (ZYLOPRIM) 300 MG tablet Take 300 mg by mouth daily.     fluticasone (FLONASE) 50 MCG/ACT nasal spray Place 2 sprays into both nostrils daily. (Patient not taking: Reported on 03/30/2022) 16 g 6   No current facility-administered medications for this visit.   Allergies: No Known Allergies Social History: Social History   Socioeconomic History   Marital status: Married    Spouse name: Not on file   Number of children: Not on file   Years of education: Not on file   Highest education level: Not on file  Occupational History   Not on file  Tobacco Use   Smoking status: Former    Types: Cigarettes    Quit date: 01/29/1993    Years since quitting: 29.1   Smokeless tobacco: Never  Vaping Use   Vaping Use: Never used  Substance and Sexual Activity   Alcohol use: No    Alcohol/week: 0.0 standard drinks of alcohol   Drug use: No   Sexual activity: Yes  Other Topics Concern   Not on file  Social History Narrative   Not on file   Social Determinants of Health   Financial Resource Strain: Not on file  Food Insecurity: Not on file  Transportation Needs: Not on file  Physical Activity: Not on file  Stress: Not on file  Social Connections: Not on file       Family History: Family History  Problem Relation Age  of Onset   Heart disease Mother    Diabetes Mother    Heart failure Mother 23   Heart disease Father    COPD Father    Heart failure Father 20   Diabetes Brother          Review of Systems  Constitutional: Negative.   HENT: Negative.    Eyes: Negative.   Respiratory: Negative.    Cardiovascular: Negative.   Gastrointestinal: Negative.   Genitourinary: Negative.   Musculoskeletal:  Positive for joint swelling.  Skin: Negative.  Negative for rash.  Neurological: Negative.   Psychiatric/Behavioral: Negative.    All other systems reviewed and are negative.  Objective: BP 121/72   Pulse 71   Ht 6\' 1"  (1.854 m)   Wt 289 lb 3.2 oz (131.2 kg)   SpO2 98%   BMI 38.16 kg/m  Body mass index is 38.16 kg/m. Physical Exam Vitals and nursing note  reviewed.  Constitutional:      Appearance: Normal appearance.  HENT:     Head: Normocephalic.     Right Ear: Ear canal and external ear normal.     Left Ear: Ear canal and external ear normal.     Nose: Nose normal.     Mouth/Throat:     Mouth: Mucous membranes are moist.     Pharynx: Oropharynx is clear.  Eyes:     Conjunctiva/sclera: Conjunctivae normal.  Cardiovascular:     Rate and Rhythm: Normal rate and regular rhythm.     Pulses: Normal pulses.     Heart sounds: Normal heart sounds.  Pulmonary:     Effort: Pulmonary effort is normal.     Breath sounds: Normal breath sounds.  Abdominal:     General: Bowel sounds are normal.  Musculoskeletal:     Right elbow: Swelling present. Normal range of motion. No tenderness.       Arms:     Comments: Bursitis right elbow  Skin:    General: Skin is warm.     Findings: No rash.  Neurological:     General: No focal deficit present.     Mental Status: He is alert and oriented to person, place, and time.  Psychiatric:        Behavior: Behavior normal.    The plan was reviewed with the patient/family, and all questions/concerned were addressed.  It was my pleasure to see  Christopher Phillips today and participate in his care. Please feel free to contact me with any questions or concerns.  Sincerely,  Jac Canavan NP [place]

## 2022-03-30 NOTE — Patient Instructions (Signed)
Bursitis  Bursitis is when the fluid-filled sac (bursa) that covers and protects a joint is swollen (inflamed). Bursitis is most common near joints such as the knees, elbows, hips, and shoulders. It can cause pain and stiffness. What are the causes? An injury to a joint area. Repeated use of a joint. Infection. Certain conditions that cause swelling. What increases the risk? Putting stress on a joint over and over again. Having a condition that weakens your body's defense system (immune system). Doing any of these often: Lifting and reaching overhead. Kneeling or leaning on hard surfaces. Doing activities that have a motion that you do over and over again. This includes running and walking. What are the signs or symptoms? Common symptoms of this condition include: Pain that gets worse when you move the affected body part or use it to support your body weight. Irritation and swelling (inflammation). Stiffness. Other symptoms include: Redness. Swelling. Tenderness. Warmth. Pain that stays after rest. Fever or chills if there is an infection. How is this treated? This condition can often be treated at home with: Rest. Ice. Wrapping the area with an elastic bandage (compression). Keeping the affected area raised (elevation). Other treatments may include: Medicine for pain and swelling. Shots of medicine to the area to lessen swelling. Draining fluid out of the bursa. Antibiotic medicine for infection. Using a splint, brace, wrap, pads, or walking aid. Therapy if pain continues or you have limited movement. Surgery. Follow these instructions at home: Medicines Take over-the-counter and prescription medicines only as told by your doctor. If you were prescribed an antibiotic medicine, take it as told by your doctor. Do not stop taking it even if you start to feel better. Managing pain, stiffness, and swelling     Raise the injured area above the level of your heart while you  are sitting or lying down. If told, put ice on the affected area. To do this: Put ice in a plastic bag. Place a towel between your skin and the bag, or between your splint or brace and the bag. Leave the ice on for 20 minutes, 2-3 times a day. Take off the ice if your skin turns bright red. This is very important. If you cannot feel pain, heat, or cold, you have a greater risk of damage to the area. If told, put heat on the affected area. Do this as often as told by your doctor. Use the heat source that your doctor recommends, such as a moist heat pack or a heating pad. Place a towel between your skin and the heat source. Leave the heat on for 20-30 minutes. Take off the heat if your skin turns bright red. This is very important. If you cannot feel pain, heat, or cold, you have a greater risk of getting burned. General instructions Rest the affected area as told by your doctor. Avoid doing things that make the pain worse. Use a splint, brace, pad, wrap, or walking aid as told by your doctor. Keep all follow-up visits. Preventing symptoms Wear knee pads if you kneel often. Wear running or walking shoes that fit you well. Take a lot of breaks during activities that involve doing the same movements again and again. Before you do any activity that takes a lot of effort, get your body ready by stretching. Stay at a healthy weight or lose weight if your doctor says you should. If you need help doing this, ask your doctor. Exercise often. If you start any new physical activity, do it   slowly. Work with your physical or occupational therapist and doctor to find what caused the bursitis. Contact a doctor if: You have a fever or chills. You have symptoms that do not get better with treatment. You have pain or swelling that: Gets worse. Goes away and then comes back. You have pus coming from the affected area. You have redness around the affected area. The affected area is warm to the  touch. Summary Bursitis is when the fluid-filled sac (bursa) that covers and protects a joint is swollen. Rest the affected area as told by your doctor. Avoid doing things that make the pain worse. Put ice on the affected area as told by your doctor. This information is not intended to replace advice given to you by your health care provider. Make sure you discuss any questions you have with your health care provider. Document Revised: 10/03/2021 Document Reviewed: 10/03/2021 Elsevier Patient Education  [place].

## 2022-04-04 DIAGNOSIS — G4733 Obstructive sleep apnea (adult) (pediatric): Secondary | ICD-10-CM | POA: Diagnosis not present

## 2022-05-04 DIAGNOSIS — G4733 Obstructive sleep apnea (adult) (pediatric): Secondary | ICD-10-CM | POA: Diagnosis not present

## 2022-08-09 ENCOUNTER — Other Ambulatory Visit: Payer: Self-pay | Admitting: Cardiology

## 2022-08-23 ENCOUNTER — Other Ambulatory Visit: Payer: Self-pay | Admitting: Cardiology

## 2022-08-23 DIAGNOSIS — I4819 Other persistent atrial fibrillation: Secondary | ICD-10-CM

## 2022-08-23 NOTE — Telephone Encounter (Signed)
Prescription refill request for Xarelto received.  Indication:Afib Last office visit:needs visit Weight:131.2 kg Age:61 XYV:OPFYT labs CrCl:needs labs  Prescription refilled

## 2022-09-04 ENCOUNTER — Other Ambulatory Visit: Payer: Self-pay | Admitting: Cardiology

## 2022-09-05 ENCOUNTER — Other Ambulatory Visit: Payer: Self-pay | Admitting: Cardiology

## 2022-09-14 ENCOUNTER — Other Ambulatory Visit: Payer: Self-pay | Admitting: Cardiology

## 2022-09-24 ENCOUNTER — Telehealth: Payer: Self-pay | Admitting: Cardiology

## 2022-09-24 ENCOUNTER — Telehealth: Payer: Self-pay

## 2022-09-24 NOTE — Telephone Encounter (Signed)
*  STAT* If patient is at the pharmacy, call can be transferred to refill team.   1. Which medications need to be refilled? (please list name of each medication and dose if known)  rivaroxaban (XARELTO) 20 MG TABS tablet  metoprolol succinate (TOPROL-XL) 50 MG 24 hr tablet  flecainide (TAMBOCOR) 100 MG tablet  lisinopril-hydrochlorothiazide (ZESTORETIC) 20-25 MG tablet  atorvastatin (LIPITOR) 80 MG tablet    2. Which pharmacy/location (including street and city if local pharmacy) is medication to be sent to?  [facility] - [place],  - [address]    3. Do they need a 30 day or 90 day supply? 90 day

## 2022-09-24 NOTE — Telephone Encounter (Signed)
Pt aware refills sent on 09/17/22 ./cy

## 2022-09-24 NOTE — Telephone Encounter (Signed)
error 

## 2022-10-25 DIAGNOSIS — N281 Cyst of kidney, acquired: Secondary | ICD-10-CM | POA: Diagnosis not present

## 2022-10-25 DIAGNOSIS — N200 Calculus of kidney: Secondary | ICD-10-CM | POA: Diagnosis not present

## 2022-10-29 ENCOUNTER — Other Ambulatory Visit: Payer: Self-pay | Admitting: Cardiology

## 2022-10-29 DIAGNOSIS — I4819 Other persistent atrial fibrillation: Secondary | ICD-10-CM

## 2022-10-30 NOTE — Telephone Encounter (Signed)
Xarelto 20mg  refill request received. Pt is 62 years old, weight-131.2kg, Crea-0.94 on 03/14/2022 via [employer], last seen by Dr. Radford Pax on 08/08/2021, Diagnosis-Afib, CrCl-149.27 mL/min; Dose is appropriate based on dosing criteria.   Has an appt with Cardiologist on 12/19/22

## 2022-11-18 ENCOUNTER — Other Ambulatory Visit: Payer: Self-pay | Admitting: Cardiology

## 2022-12-19 ENCOUNTER — Ambulatory Visit: Payer: BC Managed Care – PPO | Admitting: Cardiology

## 2023-01-05 ENCOUNTER — Other Ambulatory Visit: Payer: Self-pay | Admitting: Cardiology

## 2023-01-05 DIAGNOSIS — I4819 Other persistent atrial fibrillation: Secondary | ICD-10-CM

## 2023-01-07 NOTE — Telephone Encounter (Signed)
Pt last saw Dr Radford Pax 08/08/21, pt is overdue for follow-up. Pt has an appt scheduled to see Dr Radford Pax on 01/24/23.  Last labs 03/14/22 Creat 0.94 at [place], age 62, weight 131.2kg, CrCl 149.27, based on CrCl pt is on appropriate dosage of Xarelto 20mg  QD for afib.  Will refill rx. Placed note on upcoming appt with Dr Radford Pax to draw CBC/BMP at [place] since last labs done in May of 2023, will need updated labwork for next refill.

## 2023-01-24 ENCOUNTER — Ambulatory Visit: Payer: BC Managed Care – PPO | Admitting: Cardiology

## 2023-01-28 ENCOUNTER — Other Ambulatory Visit: Payer: Self-pay | Admitting: Cardiology

## 2023-01-29 ENCOUNTER — Other Ambulatory Visit: Payer: Self-pay | Admitting: Cardiology

## 2023-02-08 ENCOUNTER — Telehealth: Payer: Self-pay | Admitting: Cardiology

## 2023-02-08 LAB — LAB REPORT - SCANNED: EGFR: 98

## 2023-02-08 NOTE — Telephone Encounter (Signed)
NP Clovis Riley is requesting for Dr. Mayford Knife to give her a call on her personal number at [phone]. NP Elnita Maxwell stated that she had some general questions to ask and a few questions about the patient, but not specify what the questions were.

## 2023-02-15 ENCOUNTER — Ambulatory Visit: Payer: BC Managed Care – PPO | Attending: Cardiology | Admitting: Cardiology

## 2023-02-15 ENCOUNTER — Telehealth: Payer: Self-pay

## 2023-02-15 ENCOUNTER — Ambulatory Visit (INDEPENDENT_AMBULATORY_CARE_PROVIDER_SITE_OTHER): Payer: BC Managed Care – PPO

## 2023-02-15 ENCOUNTER — Encounter: Payer: Self-pay | Admitting: Cardiology

## 2023-02-15 VITALS — BP 120/80 | HR 80 | Ht 73.0 in | Wt 274.6 lb

## 2023-02-15 DIAGNOSIS — G4733 Obstructive sleep apnea (adult) (pediatric): Secondary | ICD-10-CM

## 2023-02-15 DIAGNOSIS — E7800 Pure hypercholesterolemia, unspecified: Secondary | ICD-10-CM

## 2023-02-15 DIAGNOSIS — I4819 Other persistent atrial fibrillation: Secondary | ICD-10-CM

## 2023-02-15 DIAGNOSIS — I2510 Atherosclerotic heart disease of native coronary artery without angina pectoris: Secondary | ICD-10-CM | POA: Diagnosis not present

## 2023-02-15 DIAGNOSIS — I10 Essential (primary) hypertension: Secondary | ICD-10-CM | POA: Diagnosis not present

## 2023-02-15 NOTE — Telephone Encounter (Signed)
Patient states at his visit with Dr. Mayford Knife today that he had lab work recently at outside provider. I checked PCP records as PCP is with [facility], unable to find recent labs. Call to patient who states his recent labs were with his employer. He states he will have his employer fax recent labs to [place].

## 2023-02-15 NOTE — Addendum Note (Signed)
Addended by: Luellen Pucker on: 02/15/2023 03:57 PM   Modules accepted: Orders

## 2023-02-15 NOTE — Progress Notes (Addendum)
Date:  02/15/2023   ID:  Christopher Phillips, DOB Aug 23, 1961, MRN [id number]  Patient Location:  Home  Provider location:   [place]  PCP:  Daryll Drown, NP (Inactive)  Cardiologist:  Armanda Magic, MD Electrophysiologist:  None   Chief Complaint:  Atrial fibrillation, CAD, OSA, HTN  History of Present Illness:    Christopher Phillips is a 62 y.o. 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    He is here today for followup and is doing well.  He has had a few episodes of chest pain but it resolves with TUMS.  Usually it occurs in the evening after he eats. He denies any SOB, DOE, PND, orthopnea, LE edema, dizziness, palpitations or syncope. He is compliant with his meds and is tolerating meds with no SE.    He is doing well with his PAP device and thinks that he has gotten used to it.  He tolerates the mask and feels the pressure is adequate.  Since going on PAP he feels rested in the am and has no significant daytime sleepiness.  He denies any significant mouth or nasal dryness or nasal congestion.  He does not think that he snores.    Prior CV studies:   The following studies were reviewed today:  PAP compliance download, EKG and labs  Past Medical History:  Diagnosis Date   CAD (coronary artery disease)    a. nonobstructive by cath 01/2015.   Complication of anesthesia    slow to wake up   Diabetes mellitus ([facility])    a. A1c 6.5 in 01/2015.   History of kidney stones    Hyperlipidemia    Hypertension    Morbid obesity ([facility])    OSA on CPAP    severe with AHI 24.84/hr now on CPAP at 13cm H2O   Persistent atrial fibrillation ([facility])    a. Started on flecainide - developed AF RVR with GXT. Increased to 100mg  BID, started on Xarelto. CHADSVASC = 3  (HTN , DM and nonobstructive CAD)   Past Surgical History:  Procedure Laterality Date   CYSTOSCOPY WITH  URETEROSCOPY Right 02/08/2014   Procedure: Cystoscopy Left retrograde pyleogram, right antegrade pyleogram;  Surgeon: Milford Cage, MD;  Location: [facility];  Service: Urology;  Laterality: Right;   KIDNEY STONE SURGERY     20 YRS AGO   KNEE SURGERY     BILATERAL   LEFT HEART CATHETERIZATION WITH CORONARY ANGIOGRAM N/A 02/17/2015   Procedure: LEFT HEART CATHETERIZATION WITH CORONARY ANGIOGRAM;  Surgeon: Kathleene Hazel, MD;  Location: [facility] CATH LAB;  Service: Cardiovascular;  Laterality: N/A;   NEPHROLITHOTOMY Right 02/08/2014   Procedure: NEPHROLITHOTOMY PERCUTANEOUS;  Surgeon: Milford Cage, MD;  Location: [facility];  Service: Urology;  Laterality: Right;  1ST STAGE (RT) PCNL       NEPHROLITHOTOMY Right 02/10/2014   Procedure: RIGHT NEPHROLITHOTOMY PERCUTANEOUS SECOND LOOK;  Surgeon: Milford Cage, MD;  Location: [facility];  Service: Urology;  Laterality: Right;  2ND STAGE (RT) PCNL      Current Meds  Medication Sig   allopurinol (ZYLOPRIM) 300 MG tablet Take 300 mg by mouth daily.   atorvastatin (LIPITOR) 80 MG tablet TAKE 1 TABLET BY MOUTH EVERY  EVENING   Biotin 10 MG CAPS as directed Orally   co-enzyme Q-10 30 MG capsule as directed Orally   flecainide (TAMBOCOR)  100 MG tablet TAKE 1 TABLET BY MOUTH TWICE  DAILY   lisinopril-hydrochlorothiazide (ZESTORETIC) 20-25 MG tablet TAKE 1 TABLET BY MOUTH EVERY  MORNING   Magnesium Carbonate POWD 350 mg daily. At bedtime   metoprolol succinate (TOPROL-XL) 50 MG 24 hr tablet TAKE 1 TABLET BY MOUTH DAILY  WITH OR IMMEDIATELY FOLLOWING A  MEAL   rivaroxaban (XARELTO) 20 MG TABS tablet Take 1 tablet (20 mg total) by mouth daily with supper. Overdue for follow-up and labwork, MUST see MD for FUTURE refills.     Allergies:   Patient has no known allergies.   Social History   Tobacco Use   Smoking status: Former    Types: Cigarettes    Quit date: 01/29/1993    Years since quitting: 30.0   Smokeless tobacco: Never   Vaping Use   Vaping Use: Never used  Substance Use Topics   Alcohol use: No    Alcohol/week: 0.0 standard drinks of alcohol   Drug use: No     Family Hx: The patient's family history includes COPD in his father; Diabetes in his brother and mother; Heart disease in his father and mother; Heart failure (age of onset: 70) in his father and mother.  ROS:   Please see the history of present illness.     All other systems reviewed and are negative.   Labs/Other Tests and Data Reviewed:    Recent Labs: No results found for requested labs within last 365 days.   Recent Lipid Panel Lab Results  Component Value Date/Time   CHOL 67 (L) 02/13/2018 05:00 PM   TRIG 85 02/13/2018 05:00 PM   HDL 23 (L) 02/13/2018 05:00 PM   CHOLHDL 2.9 02/13/2018 05:00 PM   CHOLHDL 7.0 02/17/2015 05:00 AM   LDLCALC 27 02/13/2018 05:00 PM    Wt Readings from Last 3 Encounters:  02/15/23 274 lb 9.6 oz (124.6 kg)  03/30/22 289 lb 3.2 oz (131.2 kg)  08/08/21 295 lb (133.8 kg)     Objective:    Vital Signs:  BP 120/80   Pulse 80   Ht 6\' 1"  (1.854 m)   Wt 274 lb 9.6 oz (124.6 kg)   SpO2 97%   BMI 36.23 kg/m    GEN: Well nourished, well developed in no acute distress HEENT: Normal NECK: No JVD; No carotid bruits LYMPHATICS: No lymphadenopathy CARDIAC:irregularly ilrregular, no murmurs, rubs, gallops RESPIRATORY:  Clear to auscultation without rales, wheezing or rhonchi  ABDOMEN: Soft, non-tender, non-distended MUSCULOSKELETAL:  No edema; No deformity  SKIN: Warm and dry NEUROLOGIC:  Alert and oriented x 3 PSYCHIATRIC:  Normal affect   EKG was done today and shows atrial fibrillation with septal infarct and no ST changes  1.  OSA - The patient is tolerating PAP therapy well without any problems.  The patient has been using and benefiting from PAP use and will continue to benefit from therapy.  -I will get a download on his device  2.  ASCAD  -cath 2016 showed nonobstructive ASCAD.   -he  has had a few episodes of CP that occurs after eating and resolves with TUMS -Continue Toprol-XL 50 mg daily and atorvastatin 80 mg daily -no ASA due to DOAC  3.  Hypertension  -BP is adequately controlled on exam today -Continue drug management with lisinopril HCT 20-25 mg daily and Toprol-XL 50 mg daily with needed refills  4.  Hyperlipidemia -LDL goal < 70 -get a copy of FLP and ALT from PCP -Continue drug management  with atorvastatin 80 mg daily with as needed refills  5.  Persistent atrial fibrillation - -it looks like he is back in afib - he thinks he goes in and out of it but has been in afib on 2 consecutive EKGs (I have not seen him in over 2 years) -check 2 week ziopatch and if he is in chronic afib will plan DCCV - if he is going in and out of it despite Flecainide then will refer to afib clinic for further suggestions (he has not missed any doses of Xarelto in the past 4 weeks) -he tells me that he was working out in the yard yesterday and got flushed and felt like he went into afib -Continue prescription drug management with flecainide 100 mg twice daily, Toprol-XL 50 mg daily, Xarelto 20 mg daily with as needed refills -get a copy of BMET and CBC from PCP  Medication Adjustments/Labs and Tests Ordered: Current medicines are reviewed at length with the patient today.  Concerns regarding medicines are outlined above.  Tests Ordered: Orders Placed This Encounter  Procedures   EKG 12-Lead    Medication Changes: No orders of the defined types were placed in this encounter.    Disposition:  Follow up in 1 year(s)  Signed, Armanda Magic, MD  02/15/2023 3:37 PM    [facility]

## 2023-02-15 NOTE — Patient Instructions (Signed)
Medication Instructions:  Your physician recommends that you continue on your current medications as directed. Please refer to the Current Medication list given to you today.  *If you need a refill on your cardiac medications before your next appointment, please call your pharmacy*   Lab Work: None.  If you have labs (blood work) drawn today and your tests are completely normal, you will receive your results only by: MyChart Message (if you have MyChart) OR A paper copy in the mail If you have any lab test that is abnormal or we need to change your treatment, we will call you to review the results.   Testing/Procedures: Dr. Mayford Knife has ordered a home heart monitor for you.      Follow-Up: At [facility], you and your health needs are our priority.  As part of our continuing mission to provide you with exceptional heart care, we have created designated Provider Care Teams.  These Care Teams include your primary Cardiologist (physician) and Advanced Practice Providers (APPs -  Physician Assistants and Nurse Practitioners) who all work together to provide you with the care you need, when you need it.  We recommend signing up for the patient portal called "MyChart".  Sign up information is provided on this After Visit Summary.  MyChart is used to connect with patients for Virtual Visits (Telemedicine).  Patients are able to view lab/test results, encounter notes, upcoming appointments, etc.  Non-urgent messages can be sent to your provider as well.   To learn more about what you can do with MyChart, go to [email or web address].    Your next appointment:   1 year(s)  Provider:   Armanda Magic, MD

## 2023-02-15 NOTE — Progress Notes (Unsigned)
Enrolled for [employer] to mail a ZIO XT long term holter monitor to the patients address on file.  

## 2023-02-20 DIAGNOSIS — I4819 Other persistent atrial fibrillation: Secondary | ICD-10-CM

## 2023-03-04 ENCOUNTER — Other Ambulatory Visit: Payer: Self-pay

## 2023-03-04 DIAGNOSIS — I4819 Other persistent atrial fibrillation: Secondary | ICD-10-CM

## 2023-03-04 MED ORDER — ATORVASTATIN CALCIUM 80 MG PO TABS: 80.0000 mg | ORAL_TABLET | Freq: Every evening | ORAL | 3 refills | Status: DC

## 2023-03-04 MED ORDER — RIVAROXABAN 20 MG PO TABS
20.0000 mg | ORAL_TABLET | Freq: Every day | ORAL | 0 refills | Status: DC
Start: 2023-03-04 — End: 2023-04-29

## 2023-03-04 MED ORDER — FLECAINIDE ACETATE 100 MG PO TABS: 100.0000 mg | ORAL_TABLET | Freq: Two times a day (BID) | ORAL | 3 refills | Status: DC

## 2023-03-04 MED ORDER — METOPROLOL SUCCINATE ER 50 MG PO TB24: ORAL_TABLET | ORAL | 3 refills | Status: DC

## 2023-03-04 MED ORDER — LISINOPRIL-HYDROCHLOROTHIAZIDE 20-25 MG PO TABS: 1.0000 | ORAL_TABLET | Freq: Every morning | ORAL | 3 refills | Status: DC

## 2023-03-04 NOTE — Telephone Encounter (Signed)
Prescription refill request for Xarelto received.  Indication:afib Last office visit:4/24 Weight:124.6  kg Age:62 WUJ:WJXBJ Labs CrCl:needs Labs  Prescription refilled

## 2023-03-08 DIAGNOSIS — I4819 Other persistent atrial fibrillation: Secondary | ICD-10-CM | POA: Diagnosis not present

## 2023-03-11 ENCOUNTER — Other Ambulatory Visit: Payer: Self-pay

## 2023-03-11 DIAGNOSIS — I4819 Other persistent atrial fibrillation: Secondary | ICD-10-CM

## 2023-03-19 DIAGNOSIS — M25561 Pain in right knee: Secondary | ICD-10-CM | POA: Diagnosis not present

## 2023-03-25 ENCOUNTER — Ambulatory Visit (HOSPITAL_COMMUNITY)
Admission: RE | Admit: 2023-03-25 | Discharge: 2023-03-25 | Disposition: A | Discharge status: Home / Self Care | Payer: BC Managed Care – PPO | Source: Ambulatory Visit | Attending: Internal Medicine | Admitting: Internal Medicine

## 2023-03-25 VITALS — BP 138/84 | HR 79 | Ht 73.0 in | Wt 285.8 lb

## 2023-03-25 DIAGNOSIS — Z7901 Long term (current) use of anticoagulants: Secondary | ICD-10-CM | POA: Diagnosis not present

## 2023-03-25 DIAGNOSIS — Z8249 Family history of ischemic heart disease and other diseases of the circulatory system: Secondary | ICD-10-CM | POA: Diagnosis not present

## 2023-03-25 DIAGNOSIS — G4733 Obstructive sleep apnea (adult) (pediatric): Secondary | ICD-10-CM | POA: Insufficient documentation

## 2023-03-25 DIAGNOSIS — D6869 Other thrombophilia: Secondary | ICD-10-CM | POA: Diagnosis not present

## 2023-03-25 DIAGNOSIS — Z87891 Personal history of nicotine dependence: Secondary | ICD-10-CM | POA: Insufficient documentation

## 2023-03-25 DIAGNOSIS — E119 Type 2 diabetes mellitus without complications: Secondary | ICD-10-CM | POA: Insufficient documentation

## 2023-03-25 DIAGNOSIS — I2510 Atherosclerotic heart disease of native coronary artery without angina pectoris: Secondary | ICD-10-CM | POA: Insufficient documentation

## 2023-03-25 DIAGNOSIS — I10 Essential (primary) hypertension: Secondary | ICD-10-CM | POA: Diagnosis not present

## 2023-03-25 DIAGNOSIS — I4819 Other persistent atrial fibrillation: Secondary | ICD-10-CM | POA: Diagnosis not present

## 2023-03-25 DIAGNOSIS — E785 Hyperlipidemia, unspecified: Secondary | ICD-10-CM | POA: Insufficient documentation

## 2023-03-25 DIAGNOSIS — Z79899 Other long term (current) drug therapy: Secondary | ICD-10-CM | POA: Diagnosis not present

## 2023-03-25 NOTE — Progress Notes (Signed)
Primary Care Physician: Christopher Drown, NP (Inactive) Primary Cardiologist: Dr. Mayford Knife Primary Electrophysiologist: Dr. Johney Frame Referring Physician: Dr. Emilio Math Phillips is a 62 y.o. male with a history of HTN, HLD, OSA on CPAP, T2DM, CAD, and persistent atrial fibrillation who presents for consultation in the [facility]. Cardiac monitor placed by Dr. Mayford Knife 5/1-12/24 showed 100% Afib burden. He has been on flecainide since 2016. Patient is on Xarelto 20 mg daily for a CHADS2VASC score of 3.  On evaluation today, he is currently in Afib. He does not specifically endorse cardiac awareness when he is in Afib. He agrees that flecainide may be a failure at this time given monitor data.    He missed a dose of Xarelto 2-3 weeks ago. He has no bleeding concerns.  Today, he denies symptoms of palpitations, chest pain, shortness of breath, orthopnea, PND, lower extremity edema, dizziness, presyncope, syncope, snoring, daytime somnolence, bleeding, or neurologic sequela. The patient is tolerating medications without difficulties and is otherwise without complaint today.   Atrial Fibrillation Risk Factors:  he does have symptoms or diagnosis of sleep apnea. he is compliant with CPAP therapy. he does not have a history of rheumatic fever. he does not have a history of alcohol use. The patient does not have a history of early familial atrial fibrillation or other arrhythmias.  he has a BMI of Body mass index is 37.71 kg/m.[name] Filed Weights   03/25/23 1530  Weight: 129.6 kg    Family History  Problem Relation Age of Onset   Heart disease Mother    Diabetes Mother    Heart failure Mother 71   Heart disease Father    COPD Father    Heart failure Father 41   Diabetes Brother     Atrial Fibrillation Management history:  Previous antiarrhythmic drugs: Flecainide Previous cardioversions: None Previous ablations: None Anticoagulation history: Xarelto 20  mg daily   Past Medical History:  Diagnosis Date   CAD (coronary artery disease)    a. nonobstructive by cath 01/2015.   Complication of anesthesia    slow to wake up   Diabetes mellitus (HCC)    a. A1c 6.5 in 01/2015.   History of kidney stones    Hyperlipidemia    Hypertension    Morbid obesity (HCC)    OSA on CPAP    severe with AHI 24.84/hr now on CPAP at 13cm H2O   Persistent atrial fibrillation (HCC)    a. Started on flecainide - developed AF RVR with GXT. Increased to 100mg  BID, started on Xarelto. CHADSVASC = 3  (HTN , DM and nonobstructive CAD)   Past Surgical History:  Procedure Laterality Date   CYSTOSCOPY WITH URETEROSCOPY Right 02/08/2014   Procedure: Cystoscopy Left retrograde pyleogram, right antegrade pyleogram;  Surgeon: Milford Cage, MD;  Location: [facility];  Service: Urology;  Laterality: Right;   KIDNEY STONE SURGERY     20 YRS AGO   KNEE SURGERY     BILATERAL   LEFT HEART CATHETERIZATION WITH CORONARY ANGIOGRAM N/A 02/17/2015   Procedure: LEFT HEART CATHETERIZATION WITH CORONARY ANGIOGRAM;  Surgeon: Kathleene Hazel, MD;  Location: [facility] CATH LAB;  Service: Cardiovascular;  Laterality: N/A;   NEPHROLITHOTOMY Right 02/08/2014   Procedure: NEPHROLITHOTOMY PERCUTANEOUS;  Surgeon: Milford Cage, MD;  Location: [facility];  Service: Urology;  Laterality: Right;  1ST STAGE (RT) PCNL       NEPHROLITHOTOMY Right 02/10/2014   Procedure: RIGHT NEPHROLITHOTOMY PERCUTANEOUS SECOND  LOOK;  Surgeon: Milford Cage, MD;  Location: [facility];  Service: Urology;  Laterality: Right;  2ND STAGE (RT) PCNL     Current Outpatient Medications  Medication Sig Dispense Refill   allopurinol (ZYLOPRIM) 300 MG tablet Take 300 mg by mouth daily.     atorvastatin (LIPITOR) 80 MG tablet Take 1 tablet (80 mg total) by mouth every evening. 90 tablet 3   Biotin 10 MG CAPS Take 1 capsule by mouth daily.     Coenzyme Q10 400 MG CAPS Taking 1 capsule by mouth daily      flecainide (TAMBOCOR) 100 MG tablet Take 1 tablet (100 mg total) by mouth 2 (two) times daily. 180 tablet 3   fluticasone (FLONASE) 50 MCG/ACT nasal spray Place 2 sprays into both nostrils daily. (Patient taking differently: Place 2 sprays into both nostrils as needed.) 16 g 6   lisinopril-hydrochlorothiazide (ZESTORETIC) 20-25 MG tablet Take 1 tablet by mouth every morning. 90 tablet 3   Magnesium Carbonate POWD Taking 1-2 Tablespoons by mouth 1-2 times weekly     metoprolol succinate (TOPROL-XL) 50 MG 24 hr tablet TAKE 1 TABLET BY MOUTH DAILY  WITH OR IMMEDIATELY FOLLOWING A  MEAL 90 tablet 3   Multiple Vitamins-Minerals (MULTIVITAMIN ADULTS PO) Take 1 tablet by mouth daily. Focus Factor     rivaroxaban (XARELTO) 20 MG TABS tablet Take 1 tablet (20 mg total) by mouth daily with supper. NEEDS LABS FOR REFILLS 30 tablet 0   acetaminophen (TYLENOL 8 HOUR) 650 MG CR tablet Take 2 tablets every 8 hours by oral route.     No current facility-administered medications for this encounter.    No Known Allergies  Social History   Socioeconomic History   Marital status: Married    Spouse name: Not on file   Number of children: Not on file   Years of education: Not on file   Highest education level: Not on file  Occupational History   Not on file  Tobacco Use   Smoking status: Former    Types: Cigarettes    Quit date: 01/29/1993    Years since quitting: 30.1   Smokeless tobacco: Never  Vaping Use   Vaping Use: Never used  Substance and Sexual Activity   Alcohol use: No    Alcohol/week: 0.0 standard drinks of alcohol   Drug use: No   Sexual activity: Yes  Other Topics Concern   Not on file  Social History Narrative   Not on file   Social Determinants of Health   Financial Resource Strain: Not on file  Food Insecurity: Not on file  Transportation Needs: Not on file  Physical Activity: Not on file  Stress: Not on file  Social Connections: Not on file  Intimate Partner Violence: Not  on file    ROS- All systems are reviewed and negative except as per the HPI above.  Physical Exam: Vitals:   03/25/23 1530  Weight: 129.6 kg  Height: 6\' 1"  (1.854 m)    GEN- The patient is a well appearing male, alert and oriented x 3 today.   Head- normocephalic, atraumatic Eyes-  Sclera clear, conjunctiva pink Ears- hearing intact Oropharynx- clear Neck- supple  Lungs- Clear to ausculation bilaterally, normal work of breathing Heart- Irregular rate and rhythm, no murmurs, rubs or gallops  GI- soft, NT, ND, + BS Extremities- no clubbing, cyanosis, or edema MS- no significant deformity or atrophy Skin- no rash or lesion Psych- euthymic mood, full affect Neuro- strength and sensation  are intact  Wt Readings from Last 3 Encounters:  03/25/23 129.6 kg  02/15/23 124.6 kg  03/30/22 131.2 kg    EKG today demonstrates  Vent. rate 79 BPM PR interval * ms QRS duration 98 ms QT/QTcB 378/433 ms P-R-T axes * 64 27 Atrial fibrillation Septal infarct , age undetermined Abnormal ECG When compared with ECG of 07-Sep-2015 15:07, PREVIOUS ECG IS PRESENT  Echo 01/31/2015 demonstrated: Study Conclusions   - Left ventricle: The cavity size was normal. Wall thickness was    normal. Systolic function was normal. The estimated ejection    fraction was in the range of 55% to 60%. Wall motion was normal;    there were no regional wall motion abnormalities.  - Left atrium: The atrium was mildly to moderately dilated.  - Right ventricle: The cavity size was mildly dilated.  - Right atrium: The atrium was mildly dilated.   Impressions:   - Technically difficult; definity used; normal LV function;    biatrial enlargement; mild RVE; trace TR.   Cardiac monitor 5/1-12/24:    Predominant rhythm was atrial fibrillation with 100% afib  burden with average heart rate 78bpm.   Rare PVCs   Pauses up to 3.2 seconds during sleep.  Epic records are reviewed at length today.  CHA2DS2-VASc  Score = 3  The patient's score is based upon: CHF History: 0 HTN History: 1 Diabetes History: 1 Stroke History: 0 Vascular Disease History: 1 Age Score: 0 Gender Score: 0       ASSESSMENT AND PLAN: Persistent Atrial Fibrillation (ICD10:  I48.19) The patient's CHA2DS2-VASc score is 3, indicating a 3.2% annual risk of stroke.    He is currently in rate controlled Afib today. He could possibly be longstanding persistent.   Discussion of options which would include continuing current flecainide dose and performing DCCV. Another option discussed was Tikosyn. Lastly, he could consider amiodarone or tikosyn as a bridge to ablation. Would schedule him to see EP to discuss ablation. He would like to think this over and will call insurance to determine [place]. He is leaning towards Tikosyn but also would be willing to consider ablation if Tikosyn is cost prohibitive.   I would not increase to flecainide 150 - last NSR ECG in 2019 showed PR 202 ms while on flecainide 100 mg BID.  Secondary Hypercoagulable State (ICD10:  D68.69) The patient is at significant risk for stroke/thromboembolism based upon his CHA2DS2-VASc Score of 3.  Continue Rivaroxaban (Xarelto).  He missed a dose 2-3 weeks ago.   He will call after discussing with insurance.    Christopher Bells, PA-C Afib [facility] [address] [place], [place] [address] [phone] 03/25/2023 4:00 PM

## 2023-03-28 ENCOUNTER — Other Ambulatory Visit (HOSPITAL_COMMUNITY): Payer: Self-pay | Admitting: *Deleted

## 2023-03-28 MED ORDER — LISINOPRIL 20 MG PO TABS: 20.0000 mg | ORAL_TABLET | Freq: Every day | ORAL | 2 refills | Status: DC

## 2023-03-29 ENCOUNTER — Telehealth: Payer: Self-pay | Admitting: Pharmacist

## 2023-03-29 NOTE — Telephone Encounter (Signed)
Medication list reviewed in anticipation of upcoming Tikosyn initiation. Patient is on HCTZ and flecainide which will need to be stopped at least 3 days prior to [facility] admission. Per note from afib clinic, pt already aware to stop these meds 3 days before. Looks like new rx for lisinopril has been sent to his pharmacy to replace combo pill, will need med list updated removing combo pill and flecainide once he stops these.  Patient is anticoagulated on Xarelto 20mg  daily on the appropriate dose. Please ensure that patient has not missed any anticoagulation doses in the 3 weeks prior to Tikosyn initiation.   Patient will need to be counseled to avoid use of Benadryl while on Tikosyn and in the 2-3 days prior to Tikosyn initiation.

## 2023-04-09 ENCOUNTER — Telehealth (HOSPITAL_COMMUNITY): Payer: Self-pay

## 2023-04-09 NOTE — Telephone Encounter (Signed)
Contacted [employer] to initiate prior authorization for Tikosyn admission Date of service 05/06/2023 is pending review. Faxed clinical information to [phone] Reference # [id number]

## 2023-04-10 NOTE — Telephone Encounter (Signed)
Authorization approved ref number [id number] auth 7/15-7/21

## 2023-04-27 ENCOUNTER — Other Ambulatory Visit: Payer: Self-pay | Admitting: Cardiology

## 2023-04-27 DIAGNOSIS — I4819 Other persistent atrial fibrillation: Secondary | ICD-10-CM

## 2023-04-29 NOTE — Telephone Encounter (Signed)
Xarelto 20mg  refill request received. Pt is 62 years old, weight-129.6kg, Crea-0.83 on 02/08/23 via scanned labs, last seen by [place] on 03/25/23, Diagnosis-Afib, CrCl-164.82 mL/min; Dose is appropriate based on dosing criteria.

## 2023-05-03 ENCOUNTER — Encounter (HOSPITAL_COMMUNITY): Payer: Self-pay

## 2023-05-06 ENCOUNTER — Other Ambulatory Visit (HOSPITAL_COMMUNITY): Payer: Self-pay

## 2023-05-06 ENCOUNTER — Encounter (HOSPITAL_COMMUNITY): Payer: Self-pay | Admitting: Cardiovascular Disease

## 2023-05-06 ENCOUNTER — Other Ambulatory Visit: Payer: Self-pay

## 2023-05-06 ENCOUNTER — Inpatient Hospital Stay (HOSPITAL_COMMUNITY)
Admission: RE | Admit: 2023-05-06 | Discharge: 2023-05-09 | DRG: 310 | Disposition: A | Discharge status: Home / Self Care | Payer: BC Managed Care – PPO | Source: Ambulatory Visit | Attending: Cardiovascular Disease | Admitting: Cardiovascular Disease

## 2023-05-06 ENCOUNTER — Ambulatory Visit (HOSPITAL_COMMUNITY)
Admission: RE | Admit: 2023-05-06 | Discharge: 2023-05-06 | Disposition: A | Discharge status: Home / Self Care | Payer: BC Managed Care – PPO | Source: Ambulatory Visit | Attending: Internal Medicine | Admitting: Internal Medicine

## 2023-05-06 VITALS — BP 128/86 | HR 83 | Ht 73.0 in | Wt 284.8 lb

## 2023-05-06 DIAGNOSIS — Z79899 Other long term (current) drug therapy: Secondary | ICD-10-CM | POA: Diagnosis not present

## 2023-05-06 DIAGNOSIS — E119 Type 2 diabetes mellitus without complications: Secondary | ICD-10-CM | POA: Diagnosis present

## 2023-05-06 DIAGNOSIS — Z7901 Long term (current) use of anticoagulants: Secondary | ICD-10-CM | POA: Diagnosis not present

## 2023-05-06 DIAGNOSIS — G4733 Obstructive sleep apnea (adult) (pediatric): Secondary | ICD-10-CM | POA: Diagnosis present

## 2023-05-06 DIAGNOSIS — Z825 Family history of asthma and other chronic lower respiratory diseases: Secondary | ICD-10-CM | POA: Diagnosis not present

## 2023-05-06 DIAGNOSIS — I4819 Other persistent atrial fibrillation: Principal | ICD-10-CM | POA: Diagnosis present

## 2023-05-06 DIAGNOSIS — Z87442 Personal history of urinary calculi: Secondary | ICD-10-CM | POA: Diagnosis not present

## 2023-05-06 DIAGNOSIS — I4891 Unspecified atrial fibrillation: Secondary | ICD-10-CM | POA: Diagnosis not present

## 2023-05-06 DIAGNOSIS — I2510 Atherosclerotic heart disease of native coronary artery without angina pectoris: Secondary | ICD-10-CM | POA: Diagnosis present

## 2023-05-06 DIAGNOSIS — E669 Obesity, unspecified: Secondary | ICD-10-CM | POA: Diagnosis not present

## 2023-05-06 DIAGNOSIS — Z833 Family history of diabetes mellitus: Secondary | ICD-10-CM | POA: Diagnosis not present

## 2023-05-06 DIAGNOSIS — D6869 Other thrombophilia: Secondary | ICD-10-CM

## 2023-05-06 DIAGNOSIS — Z6837 Body mass index (BMI) 37.0-37.9, adult: Secondary | ICD-10-CM | POA: Diagnosis not present

## 2023-05-06 DIAGNOSIS — I10 Essential (primary) hypertension: Secondary | ICD-10-CM | POA: Diagnosis present

## 2023-05-06 DIAGNOSIS — Z8249 Family history of ischemic heart disease and other diseases of the circulatory system: Secondary | ICD-10-CM

## 2023-05-06 DIAGNOSIS — E785 Hyperlipidemia, unspecified: Secondary | ICD-10-CM | POA: Diagnosis not present

## 2023-05-06 LAB — BASIC METABOLIC PANEL
Anion gap: 9 (ref 5–15)
BUN: 12 mg/dL (ref 8–23)
CO2: 25 mmol/L (ref 22–32)
Calcium: 9.4 mg/dL (ref 8.9–10.3)
Chloride: 103 mmol/L (ref 98–111)
Creatinine, Ser: 0.85 mg/dL (ref 0.61–1.24)
GFR, Estimated: >60 mL/min (ref >60)
Glucose, Bld: 209 mg/dL — ABNORMAL HIGH (ref 70–99)
Potassium: 4.6 mmol/L (ref 3.5–5.1)
Sodium: 137 mmol/L (ref 135–145)

## 2023-05-06 LAB — MAGNESIUM: Magnesium: 2 mg/dL (ref 1.7–2.4)

## 2023-05-06 LAB — HIV ANTIBODY (ROUTINE TESTING W REFLEX): HIV Screen 4th Generation wRfx: NONREACTIVE

## 2023-05-06 MED ORDER — SODIUM CHLORIDE 0.9% FLUSH: 3.0000 mL | INTRAVENOUS | Status: DC | PRN

## 2023-05-06 MED ORDER — LISINOPRIL 20 MG PO TABS
20.0000 mg | ORAL_TABLET | Freq: Every day | ORAL | Status: DC
  Administered 2023-05-07 – 2023-05-09 (×3): 20 mg via ORAL
  Filled 2023-05-06 (×3): qty 1

## 2023-05-06 MED ORDER — ACETAMINOPHEN ER 650 MG PO TBCR: 650.0000 mg | EXTENDED_RELEASE_TABLET | Freq: Two times a day (BID) | ORAL | Status: DC | PRN

## 2023-05-06 MED ORDER — MAGNESIUM SULFATE 2 GM/50ML IV SOLN
2.0000 g | Freq: Once | INTRAVENOUS | Status: AC
  Administered 2023-05-06: 2 g via INTRAVENOUS
  Filled 2023-05-06: qty 50

## 2023-05-06 MED ORDER — ALLOPURINOL 300 MG PO TABS
300.0000 mg | ORAL_TABLET | Freq: Every day | ORAL | Status: DC
  Administered 2023-05-06 – 2023-05-08 (×3): 300 mg via ORAL
  Filled 2023-05-06 (×3): qty 1

## 2023-05-06 MED ORDER — DOFETILIDE 500 MCG PO CAPS
500.0000 ug | ORAL_CAPSULE | Freq: Two times a day (BID) | ORAL | Status: DC
  Administered 2023-05-06 – 2023-05-09 (×6): 500 ug via ORAL
  Filled 2023-05-06 (×6): qty 1

## 2023-05-06 MED ORDER — METOPROLOL SUCCINATE ER 50 MG PO TB24
50.0000 mg | ORAL_TABLET | Freq: Every day | ORAL | Status: DC
  Administered 2023-05-07 – 2023-05-09 (×3): 50 mg via ORAL
  Filled 2023-05-06 (×4): qty 1

## 2023-05-06 MED ORDER — ATORVASTATIN CALCIUM 80 MG PO TABS
80.0000 mg | ORAL_TABLET | Freq: Every evening | ORAL | Status: DC
  Administered 2023-05-06 – 2023-05-08 (×3): 80 mg via ORAL
  Filled 2023-05-06 (×3): qty 1

## 2023-05-06 MED ORDER — SODIUM CHLORIDE 0.9% FLUSH
3.0000 mL | Freq: Two times a day (BID) | INTRAVENOUS | Status: DC
  Administered 2023-05-06 – 2023-05-08 (×6): 3 mL via INTRAVENOUS

## 2023-05-06 MED ORDER — SODIUM CHLORIDE 0.9 % IV SOLN: 250.0000 mL | INTRAVENOUS | Status: DC | PRN

## 2023-05-06 MED ORDER — ACETAMINOPHEN 325 MG PO TABS
650.0000 mg | ORAL_TABLET | Freq: Two times a day (BID) | ORAL | Status: DC | PRN
  Administered 2023-05-07: 650 mg via ORAL
  Filled 2023-05-06: qty 2

## 2023-05-06 MED ORDER — RIVAROXABAN 20 MG PO TABS
20.0000 mg | ORAL_TABLET | Freq: Every day | ORAL | Status: DC
  Administered 2023-05-06 – 2023-05-08 (×3): 20 mg via ORAL
  Filled 2023-05-06 (×3): qty 1

## 2023-05-06 NOTE — Addendum Note (Signed)
Encounter addended by: Eustace Pen, PA-C on: 05/06/2023 1:58 PM  Actions taken: Clinical Note Signed

## 2023-05-06 NOTE — Progress Notes (Signed)
Pharmacy: Dofetilide (Tikosyn) - Initial Consult Assessment and Electrolyte Replacement  Pharmacy consulted to assist in monitoring and replacing electrolytes in this 62 y.o. male admitted on 05/06/2023 undergoing dofetilide initiation. First dofetilide dose: 7/15@2000 .  Assessment:  Patient Exclusion Criteria: If any screening criteria checked as "Yes", then  patient  should NOT receive dofetilide until criteria item is corrected.  If "Yes" please indicate correction plan.  YES  NO Patient  Exclusion Criteria Correction Plan   []   [x]   Baseline QTc interval is greater than or equal to 440 msec. IF above YES box checked dofetilide contraindicated unless patient has ICD; then may proceed if QTc 500-550 msec or with known ventricular conduction abnormalities may proceed with QTc 550-600 msec. QTc = 408    []   [x]   Patient is known or suspected to have a digoxin level greater than 2 ng/ml: No results found for: "DIGOXIN"     []   [x]   Creatinine clearance less than 20 ml/min (calculated using Cockcroft-Gault, actual body weight and serum creatinine): Estimated Creatinine Clearance: 123.7 mL/min (by C-G formula based on SCr of 0.85 mg/dL).     []   [x]  Patient has received drugs known to prolong the QT intervals within the last 48 hours (phenothiazines, tricyclics or tetracyclic antidepressants, erythromycin, H-1 antihistamines, cisapride, fluoroquinolones, azithromycin, ondansetron).   Updated information on QT prolonging agents is available to be searched on the following database:QT prolonging agents     []   [x]   Patient received a dose of hydrochlorothiazide (Oretic) alone or in any combination including triamterene (Dyazide, Maxzide) in the last 48 hours. Was on lisinopril-HCTZ 3 days ago - now on lisinopril   []   [x]  Patient received a medication known to increase dofetilide plasma concentrations prior to initial dofetilide dose:  Trimethoprim (Primsol, Proloprim) in the last  36 hours Verapamil (Calan, Verelan) in the last 36 hours or a sustained release dose in the last 72 hours Megestrol (Megace) in the last 5 days  Cimetidine (Tagamet) in the last 6 hours Ketoconazole (Nizoral) in the last 24 hours Itraconazole (Sporanox) in the last 48 hours  Prochlorperazine (Compazine) in the last 36 hours     []   [x]   Patient is known to have a history of torsades de pointes; congenital or acquired long QT syndromes.    []   [x]   Patient has received a Class 1 antiarrhythmic with less than 2 half-lives since last dose. (Disopyramide, Quinidine, Procainamide, Lidocaine, Mexiletine, Flecainide, Propafenone) Stop flecainide 3 days ago   []   [x]   Patient has received amiodarone therapy in the past 3 months or amiodarone level is greater than 0.3 ng/ml.    Labs:    Component Value Date/Time   K 4.6 05/06/2023 1254   MG 2.0 05/06/2023 1254     Plan: Select One Calculated CrCl  Dose q12h  [x]  > 60 ml/min 500 mcg  []  40-60 ml/min 250 mcg  []  20-40 ml/min 125 mcg   [x]   Physician selected initial dose within range recommended for patients level of renal function - will monitor for response.  []   Physician selected initial dose outside of range recommended for patients level of renal function - will discuss if the dose should be altered at this time.   Patient has been appropriately anticoagulated with Xarelto.  Potassium: K >/= 4: Appropriate to initiate Tikosyn, no replacement needed    Magnesium: Mg 1.8-2: Give Mg 2 gm IV x1 to prevent Mg from dropping below 1.8 - do not need to  recheck Mg. Appropriate to initiate Tikosyn   Thank you for allowing pharmacy to participate in this patient's care,  Sherron Monday, PharmD, BCCCP Clinical Pharmacist  Phone: [phone] 05/06/2023 1:50 PM  Please check AMION for all [facility] Pharmacy phone numbers After 10:00 PM, call Main Pharmacy [phone]

## 2023-05-06 NOTE — Progress Notes (Addendum)
Primary Care Physician: Daryll Drown, NP (Inactive) Primary Cardiologist: Dr. Mayford Knife Primary Electrophysiologist: Dr. Johney Frame Referring Physician: Dr. Emilio Math Kriz is a 62 y.o. male with a history of HTN, HLD, OSA on CPAP, T2DM, CAD, and persistent atrial fibrillation who presents for consultation in the [facility]. Cardiac monitor placed by Dr. Mayford Knife 5/1-12/24 showed 100% Afib burden. He has been on flecainide since 2016. Patient is on Xarelto 20 mg daily for a CHADS2VASC score of 3.  On evaluation today, he is currently in Afib. He does not specifically endorse cardiac awareness when he is in Afib. He agrees that flecainide may be a failure at this time given monitor data.    He missed a dose of Xarelto 2-3 weeks ago. He has no bleeding concerns.  On follow up 05/06/23, patient is here for Tikosyn admission. He is currently in Afib. He has not started any new medications since pharmacist review. No missed doses of Xarelto. No benadryl use. He stopped flecainide and hydrochlorothiazide 3 days prior to today.    Today, he denies symptoms of palpitations, chest pain, shortness of breath, orthopnea, PND, lower extremity edema, dizziness, presyncope, syncope, snoring, daytime somnolence, bleeding, or neurologic sequela. The patient is tolerating medications without difficulties and is otherwise without complaint today.   Atrial Fibrillation Risk Factors:  he does have symptoms or diagnosis of sleep apnea. he is compliant with CPAP therapy. he does not have a history of rheumatic fever. he does not have a history of alcohol use. The patient does not have a history of early familial atrial fibrillation or other arrhythmias.  he has a BMI of Body mass index is 37.57 kg/m.[name] Filed Weights   05/06/23 1137  Weight: 129.2 kg     Family History  Problem Relation Age of Onset   Heart disease Mother    Diabetes Mother    Heart failure Mother 19    Heart disease Father    COPD Father    Heart failure Father 50   Diabetes Brother     Atrial Fibrillation Management history:  Previous antiarrhythmic drugs: Flecainide Previous cardioversions: None Previous ablations: None Anticoagulation history: Xarelto 20 mg daily   Past Medical History:  Diagnosis Date   CAD (coronary artery disease)    a. nonobstructive by cath 01/2015.   Complication of anesthesia    slow to wake up   Diabetes mellitus (HCC)    a. A1c 6.5 in 01/2015.   History of kidney stones    Hyperlipidemia    Hypertension    Morbid obesity (HCC)    OSA on CPAP    severe with AHI 24.84/hr now on CPAP at 13cm H2O   Persistent atrial fibrillation (HCC)    a. Started on flecainide - developed AF RVR with GXT. Increased to 100mg  BID, started on Xarelto. CHADSVASC = 3  (HTN , DM and nonobstructive CAD)   Past Surgical History:  Procedure Laterality Date   CYSTOSCOPY WITH URETEROSCOPY Right 02/08/2014   Procedure: Cystoscopy Left retrograde pyleogram, right antegrade pyleogram;  Surgeon: Milford Cage, MD;  Location: [facility];  Service: Urology;  Laterality: Right;   KIDNEY STONE SURGERY     20 YRS AGO   KNEE SURGERY     BILATERAL   LEFT HEART CATHETERIZATION WITH CORONARY ANGIOGRAM N/A 02/17/2015   Procedure: LEFT HEART CATHETERIZATION WITH CORONARY ANGIOGRAM;  Surgeon: Kathleene Hazel, MD;  Location: [facility] CATH LAB;  Service: Cardiovascular;  Laterality: N/A;  NEPHROLITHOTOMY Right 02/08/2014   Procedure: NEPHROLITHOTOMY PERCUTANEOUS;  Surgeon: Milford Cage, MD;  Location: [facility];  Service: Urology;  Laterality: Right;  1ST STAGE (RT) PCNL       NEPHROLITHOTOMY Right 02/10/2014   Procedure: RIGHT NEPHROLITHOTOMY PERCUTANEOUS SECOND LOOK;  Surgeon: Milford Cage, MD;  Location: [facility];  Service: Urology;  Laterality: Right;  2ND STAGE (RT) PCNL     Current Outpatient Medications  Medication Sig Dispense Refill   acetaminophen (TYLENOL  8 HOUR) 650 MG CR tablet 1,300 mg as needed. Arthritis Tylenol     allopurinol (ZYLOPRIM) 300 MG tablet Take 300 mg by mouth daily.     Ascorbic Acid (VITAMIN C) 1000 MG tablet Take 1,000 mg by mouth daily.     atorvastatin (LIPITOR) 80 MG tablet Take 1 tablet (80 mg total) by mouth every evening. 90 tablet 3   Biotin 10 MG CAPS Take 1 capsule by mouth daily.     Coenzyme Q10 400 MG CAPS Taking 1 capsule by mouth daily     fluticasone (FLONASE) 50 MCG/ACT nasal spray Place 2 sprays into both nostrils daily. (Patient taking differently: Place 2 sprays into both nostrils as needed.) 16 g 6   lisinopril (ZESTRIL) 20 MG tablet Take 1 tablet (20 mg total) by mouth daily. 90 tablet 2   Magnesium Carbonate POWD Taking 1-2 Tablespoons by mouth twice monthly     metoprolol succinate (TOPROL-XL) 50 MG 24 hr tablet TAKE 1 TABLET BY MOUTH DAILY  WITH OR IMMEDIATELY FOLLOWING A  MEAL 90 tablet 3   Multiple Vitamins-Minerals (MULTIVITAMIN ADULTS PO) Take 1 tablet by mouth daily. Focus Factor     rivaroxaban (XARELTO) 20 MG TABS tablet Take 1 tablet (20 mg total) by mouth daily with supper. 180 tablet 1   No current facility-administered medications for this encounter.    No Known Allergies  ROS- All systems are reviewed and negative except as per the HPI above.  Physical Exam: Vitals:   05/06/23 1137  BP: 128/86  Pulse: 83  Weight: 129.2 kg  Height: 6\' 1"  (1.854 m)    GEN- The patient is well appearing, alert and oriented x 3 today.   Neck - no JVD or carotid bruit noted Lungs- Clear to ausculation bilaterally, normal work of breathing Heart- Irregular rate and rhythm, no murmurs, rubs or gallops, PMI not laterally displaced Extremities- no clubbing, cyanosis, or edema Skin - no rash or ecchymosis noted   Wt Readings from Last 3 Encounters:  05/06/23 129.2 kg  03/25/23 129.6 kg  02/15/23 124.6 kg    EKG today demonstrates  Vent. rate 83 BPM PR interval * ms QRS duration 90 ms QT/QTcB  348/408 ms P-R-T axes * 51 34 Atrial fibrillation Septal infarct , age undetermined Abnormal ECG When compared with ECG of 25-Mar-2023 15:56, PREVIOUS ECG IS PRESENT  Echo 01/31/2015 demonstrated: Study Conclusions   - Left ventricle: The cavity size was normal. Wall thickness was    normal. Systolic function was normal. The estimated ejection    fraction was in the range of 55% to 60%. Wall motion was normal;    there were no regional wall motion abnormalities.  - Left atrium: The atrium was mildly to moderately dilated.  - Right ventricle: The cavity size was mildly dilated.  - Right atrium: The atrium was mildly dilated.   Impressions:   - Technically difficult; definity used; normal LV function;    biatrial enlargement; mild RVE; trace TR.   Cardiac  monitor 5/1-12/24:    Predominant rhythm was atrial fibrillation with 100% afib  burden with average heart rate 78bpm.   Rare PVCs   Pauses up to 3.2 seconds during sleep.  Epic records are reviewed at length today.  CHA2DS2-VASc Score = 3  The patient's score is based upon: CHF History: 0 HTN History: 1 Diabetes History: 1 Stroke History: 0 Vascular Disease History: 1 Age Score: 0 Gender Score: 0    ASSESSMENT AND PLAN: Persistent Atrial Fibrillation (ICD10:  I48.19) The patient's CHA2DS2-VASc score is 3, indicating a 3.2% annual risk of stroke.    He is currently in rate controlled Afib today.   Patient would like to pursue dofetilide and presents for dofetilide admission. Continue Xarelto, states no missed doses in the last 3 weeks. No recent benadryl use. PharmD has screened medications. QTc in SR 447 ms (2019 ECG)   Labs: creat 0.85, K 4.6, mag 2.0, CrCl 160 mL/min qualify for 500 mcg bid dosage.   Secondary Hypercoagulable State (ICD10:  D68.69) The patient is at significant risk for stroke/thromboembolism based upon his CHA2DS2-VASc Score of 3.  Continue Rivaroxaban (Xarelto).  No missed doses in the  past 3 weeks.    Patient has a room ready and will present to admissions.    Lake Bells, PA-C Afib [facility] [address] [place], [place] [address] [phone] 05/06/2023 12:21 PM

## 2023-05-06 NOTE — H&P (Signed)
Primary Care Physician: Daryll Drown, NP (Inactive) Primary Cardiologist: Dr. Mayford Knife Primary Electrophysiologist: Dr. Nelly Laurence Referring Physician: Dr. Emilio Math Christopher Phillips is a 63 y.o. male with a history of HTN, HLD, OSA on CPAP, T2DM, CAD, and persistent atrial fibrillation who presents for consultation in the [facility]. Cardiac monitor placed by Dr. Mayford Knife 5/1-12/24 showed 100% Afib burden. He has been on flecainide since 2016. Patient is on Xarelto 20 mg daily for a CHADS2VASC score of 3.  On evaluation today, he is currently in Afib. He does not specifically endorse cardiac awareness when he is in Afib. He agrees that flecainide may be a failure at this time given monitor data.    On follow up 05/06/23, patient is here for Tikosyn admission. He is currently in Afib. He has not started any new medications since pharmacist review. No missed doses of Xarelto. No benadryl use. He stopped flecainide and hydrochlorothiazide 3 days prior to today.    Today, he denies symptoms of palpitations, chest pain, shortness of breath, orthopnea, PND, lower extremity edema, dizziness, presyncope, syncope, snoring, daytime somnolence, bleeding, or neurologic sequela. The patient is tolerating medications without difficulties and is otherwise without complaint today.   Atrial Fibrillation Risk Factors:  he does have symptoms or diagnosis of sleep apnea. he is compliant with CPAP therapy. he does not have a history of rheumatic fever. he does not have a history of alcohol use. The patient does not have a history of early familial atrial fibrillation or other arrhythmias.  he has a BMI of Body mass index is 36.81 kg/m.[name] Filed Weights   05/06/23 1507  Weight: 126.6 kg     Family History  Problem Relation Age of Onset   Heart disease Mother    Diabetes Mother    Heart failure Mother 67   Heart disease Father    COPD Father    Heart failure Father 31   Diabetes  Brother     Atrial Fibrillation Management history:  Previous antiarrhythmic drugs: Flecainide Previous cardioversions: None Previous ablations: None Anticoagulation history: Xarelto 20 mg daily   Past Medical History:  Diagnosis Date   CAD (coronary artery disease)    a. nonobstructive by cath 01/2015.   Complication of anesthesia    slow to wake up   Diabetes mellitus (HCC)    a. A1c 6.5 in 01/2015.   History of kidney stones    Hyperlipidemia    Hypertension    Morbid obesity (HCC)    OSA on CPAP    severe with AHI 24.84/hr now on CPAP at 13cm H2O   Persistent atrial fibrillation (HCC)    a. Started on flecainide - developed AF RVR with GXT. Increased to 100mg  BID, started on Xarelto. CHADSVASC = 3  (HTN , DM and nonobstructive CAD)   Past Surgical History:  Procedure Laterality Date   CYSTOSCOPY WITH URETEROSCOPY Right 02/08/2014   Procedure: Cystoscopy Left retrograde pyleogram, right antegrade pyleogram;  Surgeon: Milford Cage, MD;  Location: [facility];  Service: Urology;  Laterality: Right;   KIDNEY STONE SURGERY     20 YRS AGO   KNEE SURGERY     BILATERAL   LEFT HEART CATHETERIZATION WITH CORONARY ANGIOGRAM N/A 02/17/2015   Procedure: LEFT HEART CATHETERIZATION WITH CORONARY ANGIOGRAM;  Surgeon: Kathleene Hazel, MD;  Location: [facility] CATH LAB;  Service: Cardiovascular;  Laterality: N/A;   NEPHROLITHOTOMY Right 02/08/2014   Procedure: NEPHROLITHOTOMY PERCUTANEOUS;  Surgeon: Milford Cage,  MD;  Location: [facility];  Service: Urology;  Laterality: Right;  1ST STAGE (RT) PCNL       NEPHROLITHOTOMY Right 02/10/2014   Procedure: RIGHT NEPHROLITHOTOMY PERCUTANEOUS SECOND LOOK;  Surgeon: Milford Cage, MD;  Location: [facility];  Service: Urology;  Laterality: Right;  2ND STAGE (RT) PCNL     Current Facility-Administered Medications  Medication Dose Route Frequency Provider Last Rate Last Admin   0.9 %  sodium chloride infusion  250 mL Intravenous PRN  Eustace Pen, PA-C       acetaminophen (TYLENOL) tablet 650 mg  650 mg Oral BID PRN Shalva Rozycki, Roberts Gaudy, MD       allopurinol (ZYLOPRIM) tablet 300 mg  300 mg Oral Daily Eustace Pen, PA-C   300 mg at 05/06/23 1718   atorvastatin (LIPITOR) tablet 80 mg  80 mg Oral QPM Eustace Pen, PA-C   80 mg at 05/06/23 1719   dofetilide (TIKOSYN) capsule 500 mcg  500 mcg Oral BID Eustace Pen, PA-C       [START ON 05/07/2023] lisinopril (ZESTRIL) tablet 20 mg  20 mg Oral Daily Eustace Pen, PA-C       [START ON 05/07/2023] metoprolol succinate (TOPROL-XL) 24 hr tablet 50 mg  50 mg Oral Daily Eustace Pen, PA-C       rivaroxaban Carlena Hurl) tablet 20 mg  20 mg Oral Q supper Eustace Pen, PA-C   20 mg at 05/06/23 1719   sodium chloride flush (NS) 0.9 % injection 3 mL  3 mL Intravenous Q12H Eustace Pen, PA-C   3 mL at 05/06/23 1458   sodium chloride flush (NS) 0.9 % injection 3 mL  3 mL Intravenous PRN Eustace Pen, PA-C        No Known Allergies  ROS- All systems are reviewed and negative except as per the HPI above.  Physical Exam: Vitals:   05/06/23 1254 05/06/23 1507 05/06/23 1509 05/06/23 1546  BP:    (!) 130/90  Pulse:   71 76  Resp: 18   18  Temp: 97.7 F (36.5 C)   98 F (36.7 C)  TempSrc: Oral   Oral  SpO2:    95%  Weight:  126.6 kg    Height:  6\' 1"  (1.854 m)      GEN- The patient is well appearing, alert and oriented x 3 today.   Neck - no JVD or carotid bruit noted Lungs- Clear to ausculation bilaterally, normal work of breathing Heart- Irregular rate and rhythm, no murmurs, rubs or gallops, PMI not laterally displaced Extremities- no clubbing, cyanosis, or edema Skin - no rash or ecchymosis noted   Wt Readings from Last 3 Encounters:  05/06/23 126.6 kg  05/06/23 129.2 kg  03/25/23 129.6 kg    EKG today demonstrates  Vent. rate 83 BPM PR interval * ms QRS duration 90 ms QT/QTcB 348/408 ms P-R-T axes * 51 34 Atrial fibrillation Septal  infarct , age undetermined Abnormal ECG When compared with ECG of 25-Mar-2023 15:56, PREVIOUS ECG IS PRESENT  Echo 01/31/2015 demonstrated: Study Conclusions   - Left ventricle: The cavity size was normal. Wall thickness was    normal. Systolic function was normal. The estimated ejection    fraction was in the range of 55% to 60%. Wall motion was normal;    there were no regional wall motion abnormalities.  - Left atrium: The atrium was mildly to moderately dilated.  - Right ventricle: The cavity size  was mildly dilated.  - Right atrium: The atrium was mildly dilated.   Impressions:   - Technically difficult; definity used; normal LV function;    biatrial enlargement; mild RVE; trace TR.   Cardiac monitor 5/1-12/24:    Predominant rhythm was atrial fibrillation with 100% afib  burden with average heart rate 78bpm.   Rare PVCs   Pauses up to 3.2 seconds during sleep.  Epic records are reviewed at length today.  CHA2DS2-VASc Score = 3  The patient's score is based upon: CHF History: 0 HTN History: 1 Diabetes History: 1 Stroke History: 0 Vascular Disease History: 1 Age Score: 0 Gender Score: 0    ASSESSMENT AND PLAN: Persistent Atrial Fibrillation (ICD10:  I48.19) The patient's CHA2DS2-VASc score is 3, indicating a 3.2% annual risk of stroke.    He is currently in rate controlled Afib today.   Patient would like to pursue dofetilide and presents for dofetilide admission. Continue Xarelto, states no missed doses in the last 3 weeks. No recent benadryl use. PharmD has screened medications. QTc in SR 447 ms (2019 ECG)   Labs: creat 0.85, K 4.6, mag 2.0, CrCl 160 mL/min qualify for 500 mcg bid dosage.   Secondary Hypercoagulable State (ICD10:  D68.69) The patient is at significant risk for stroke/thromboembolism based upon his CHA2DS2-VASc Score of 3.  Continue Rivaroxaban (Xarelto).  No missed doses in the past 3 weeks.       Maurice Small,  MD 05/06/2023 6:59 PM

## 2023-05-06 NOTE — Plan of Care (Signed)

## 2023-05-06 NOTE — TOC Benefit Eligibility Note (Signed)
Pharmacy Patient Advocate Encounter  Insurance verification completed.    The patient is insured through [facility]   Ran test claim for dofetilide (Tikosyn) 500 mcg capsules and the current 30 day co-pay is $0.00.   This test claim was processed through [facility]- copay amounts may vary at other pharmacies due to pharmacy/plan contracts, or as the patient moves through the different stages of their insurance plan.    Roland Earl, CPHT Pharmacy Patient Advocate Specialist [facility] Patient Advocate Team Direct Number: [phone]  Fax: [phone]

## 2023-05-07 ENCOUNTER — Inpatient Hospital Stay (HOSPITAL_COMMUNITY): Payer: BC Managed Care – PPO

## 2023-05-07 DIAGNOSIS — I4891 Unspecified atrial fibrillation: Secondary | ICD-10-CM | POA: Diagnosis not present

## 2023-05-07 DIAGNOSIS — I4819 Other persistent atrial fibrillation: Secondary | ICD-10-CM | POA: Diagnosis not present

## 2023-05-07 LAB — MAGNESIUM: Magnesium: 2.1 mg/dL (ref 1.7–2.4)

## 2023-05-07 LAB — BASIC METABOLIC PANEL
Anion gap: 7 (ref 5–15)
BUN: 13 mg/dL (ref 8–23)
CO2: 23 mmol/L (ref 22–32)
Calcium: 8.9 mg/dL (ref 8.9–10.3)
Chloride: 108 mmol/L (ref 98–111)
Creatinine, Ser: 0.75 mg/dL (ref 0.61–1.24)
GFR, Estimated: >60 mL/min (ref >60)
Glucose, Bld: 113 mg/dL — ABNORMAL HIGH (ref 70–99)
Potassium: 3.7 mmol/L (ref 3.5–5.1)
Sodium: 138 mmol/L (ref 135–145)

## 2023-05-07 LAB — ECHOCARDIOGRAM COMPLETE
Area-P 1/2: 3.17 cm2
Calc EF: 58.2 %
Height: 73 in
S' Lateral: 3.9 cm
Single Plane A2C EF: 58.2 %
Single Plane A4C EF: 62.4 %
Weight: 4464 oz

## 2023-05-07 MED ORDER — SODIUM CHLORIDE 0.9 % IV SOLN: INTRAVENOUS | Status: DC

## 2023-05-07 MED ORDER — POTASSIUM CHLORIDE CRYS ER 20 MEQ PO TBCR
40.0000 meq | EXTENDED_RELEASE_TABLET | Freq: Once | ORAL | Status: AC
  Administered 2023-05-07: 40 meq via ORAL
  Filled 2023-05-07: qty 2

## 2023-05-07 MED ORDER — PERFLUTREN LIPID MICROSPHERE
1.0000 mL | INTRAVENOUS | Status: AC | PRN
  Administered 2023-05-07: 2 mL via INTRAVENOUS

## 2023-05-07 NOTE — H&P (View-Only) (Signed)
Post dose EKG is reviewed Stable QTc, remains in AFib OK to continue Tikosyn loading NPO after MN for possible DCCV if needed.  Francis Dowse, PA-C

## 2023-05-07 NOTE — Progress Notes (Signed)
Rounding Note    Patient Name: Christopher Phillips Date of Encounter: 05/07/2023  [facility] Cardiologist: Armanda Magic, MD   Subjective   Tolerating drug, observed slow HRs last night, monitor ringing with HRs 20's by his observation  Inpatient Medications    Scheduled Meds:  allopurinol  300 mg Oral Daily   atorvastatin  80 mg Oral QPM   dofetilide  500 mcg Oral BID   lisinopril  20 mg Oral Daily   metoprolol succinate  50 mg Oral Daily   potassium chloride  40 mEq Oral Once   rivaroxaban  20 mg Oral Q supper   sodium chloride flush  3 mL Intravenous Q12H   Continuous Infusions:  sodium chloride     PRN Meds: sodium chloride, acetaminophen, sodium chloride flush   Vital Signs    Vitals:   05/06/23 1546 05/06/23 2021 05/07/23 0003 05/07/23 0506  BP: (!) 130/90 (!) 133/90 120/61 115/78  Pulse: 76 75 69 66  Resp: 18 18 18    Temp: 98 F (36.7 C) 98.1 F (36.7 C) 98.1 F (36.7 C) 98.1 F (36.7 C)  TempSrc: Oral Oral Oral Oral  SpO2: 95% 98% 96% 98%  Weight:      Height:        Intake/Output Summary (Last 24 hours) at 05/07/2023 0816 Last data filed at 05/06/2023 2355 Gross per 24 hour  Intake 242.65 ml  Output --  Net 242.65 ml      05/06/2023    3:07 PM 05/06/2023   11:37 AM 03/25/2023    3:30 PM  Last 3 Weights  Weight (lbs) 279 lb 284 lb 12.8 oz 285 lb 12.8 oz  Weight (kg) 126.554 kg 129.184 kg 129.638 kg      Telemetry    AFib 40's overnight, beat to beat slowing, no significant/persistent bradycardia >> 70's-120's - Personally Reviewed  ECG    AFib 78bpm, QTc  - Personally Reviewed, by   Physical Exam   GEN: No acute distress.   Neck: No JVD Cardiac: irreg-irreg, no murmurs, rubs, or gallops.  Respiratory: CTA b/l GI: Soft, nontender, non-distended  MS: No edema; No deformity. Neuro:  Nonfocal  Psych: Normal affect   Labs    High Sensitivity Troponin:  No results for input(s): "TROPONINIHS" in the last 720 hours.    Chemistry Recent Labs  Lab 05/06/23 1254 05/07/23 0000  NA 137 138  K 4.6 3.7  CL 103 108  CO2 25 23  GLUCOSE 209* 113*  BUN 12 13  CREATININE 0.85 0.75  CALCIUM 9.4 8.9  MG 2.0 2.1  GFRNONAA >60 >60  ANIONGAP 9 7    Lipids No results for input(s): "CHOL", "TRIG", "HDL", "LABVLDL", "LDLCALC", "CHOLHDL" in the last 168 hours.  HematologyNo results for input(s): "WBC", "RBC", "HGB", "HCT", "MCV", "MCH", "MCHC", "RDW", "PLT" in the last 168 hours. Thyroid No results for input(s): "TSH", "FREET4" in the last 168 hours.  BNPNo results for input(s): "BNP", "PROBNP" in the last 168 hours.  DDimer No results for input(s): "DDIMER" in the last 168 hours.   Radiology    No results found.  Cardiac Studies   01/31/2015: TTE Study Conclusions  - Left ventricle: The cavity size was normal. Wall thickness was    normal. Systolic function was normal. The estimated ejection    fraction was in the range of 55% to 60%. Wall motion was normal;    there were no regional wall motion abnormalities.  - Left atrium: The  atrium was mildly to moderately dilated.  - Right ventricle: The cavity size was mildly dilated.  - Right atrium: The atrium was mildly dilated.   Impressions:  - Technically difficult; definity used; normal LV function;    biatrial enlargement; mild RVE; trace TR.   Patient Profile     62 y.o. 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 Mayford Knife for AAD management given 100% AFib burden on a monitor, felt to be a failure ofhis flecainide that he has been on for years.  His last visit with Dr. Johney Frame was 2016, his last SR EKG 2019, patient reports by symptoms, in/out of AFib, not felt to have been persistently in AFib all these years  Assessment & Plan    Persistent AFib CHA2DS2Vasc is 2, on Xarelto Tikosyn load is in progress K+ 3.7 Mag 2.1 Creat  0.75 QTc stable  DCCV tomorrow if not in SR, pt aware and agreeable Update his echo while here, looking towards consideration of ablation down te road is reasonable   OSA Home CPAP at bedside  HTN Home meds   For questions or updates, please contact [facility] Please consult [email or web address] for contact info under        Signed, Sheilah Pigeon, PA-C  05/07/2023, 8:16 AM

## 2023-05-07 NOTE — Plan of Care (Signed)

## 2023-05-07 NOTE — Progress Notes (Signed)
Post dose EKG is reviewed Stable QTc, remains in AFib OK to continue Tikosyn loading NPO after MN for possible DCCV if needed.  Francis Dowse, PA-C

## 2023-05-07 NOTE — Progress Notes (Signed)
Pharmacy: Dofetilide (Tikosyn) - Follow Up Assessment and Electrolyte Replacement  Pharmacy consulted to assist in monitoring and replacing electrolytes in this 62 y.o. male admitted on 05/06/2023 undergoing dofetilide initiation. First dofetilide dose: 05/06/23  Labs:    Component Value Date/Time   K 3.7 05/07/2023 0000   MG 2.1 05/07/2023 0000     Plan: Potassium: K 3.8-3.9:  Give KCl 40 mEq po x1   Magnesium: Mg > 2: No additional supplementation needed  Thank you for allowing pharmacy to participate in this patient's care   Romie Minus, PharmD PGY1 Pharmacy Resident  Please check AMION for all [facility] phone numbers After 10:00 PM, call Main Pharmacy [phone]

## 2023-05-08 ENCOUNTER — Inpatient Hospital Stay (HOSPITAL_COMMUNITY)
Admission: RE | Admit: 2023-05-08 | Payer: BC Managed Care – PPO | Source: Home / Self Care | Admitting: Cardiovascular Disease

## 2023-05-08 ENCOUNTER — Encounter (HOSPITAL_COMMUNITY): Admission: RE | Payer: Self-pay | Source: Home / Self Care

## 2023-05-08 ENCOUNTER — Inpatient Hospital Stay (HOSPITAL_COMMUNITY): Payer: BC Managed Care – PPO | Admitting: Anesthesiology

## 2023-05-08 ENCOUNTER — Encounter (HOSPITAL_COMMUNITY)
Admission: RE | Disposition: A | Discharge status: Home / Self Care | Payer: Self-pay | Source: Ambulatory Visit | Attending: Internal Medicine

## 2023-05-08 DIAGNOSIS — I4819 Other persistent atrial fibrillation: Secondary | ICD-10-CM | POA: Diagnosis not present

## 2023-05-08 HISTORY — PX: CARDIOVERSION: SHX1299

## 2023-05-08 LAB — BASIC METABOLIC PANEL
Anion gap: 6 (ref 5–15)
BUN: 11 mg/dL (ref 8–23)
CO2: 27 mmol/L (ref 22–32)
Calcium: 9 mg/dL (ref 8.9–10.3)
Chloride: 105 mmol/L (ref 98–111)
Creatinine, Ser: 0.87 mg/dL (ref 0.61–1.24)
GFR, Estimated: >60 mL/min (ref >60)
Glucose, Bld: 138 mg/dL — ABNORMAL HIGH (ref 70–99)
Potassium: 4.7 mmol/L (ref 3.5–5.1)
Sodium: 138 mmol/L (ref 135–145)

## 2023-05-08 LAB — MAGNESIUM: Magnesium: 2.2 mg/dL (ref 1.7–2.4)

## 2023-05-08 SURGERY — CARDIOVERSION
Anesthesia: General

## 2023-05-08 MED ORDER — PROPOFOL 10 MG/ML IV BOLUS
INTRAVENOUS | Status: DC | PRN
  Administered 2023-05-08: 10 mg via INTRAVENOUS
  Administered 2023-05-08: 50 mg via INTRAVENOUS
  Administered 2023-05-08: 40 mg via INTRAVENOUS

## 2023-05-08 SURGICAL SUPPLY — 1 items: ELECT DEFIB PAD ADLT CADENCE (PAD) ×2 IMPLANT

## 2023-05-08 NOTE — Progress Notes (Signed)
Post dose and post DCCV ekgs reviewed QTc stable Continue Tikosyn  Anticipate d/c tomorrow Francis Dowse, PA-C

## 2023-05-08 NOTE — Anesthesia Preprocedure Evaluation (Signed)
Anesthesia Evaluation  Patient identified by MRN, date of birth, ID band Patient awake    Reviewed: Allergy & Precautions, NPO status , Patient's Chart, lab work & pertinent test results, reviewed documented beta blocker date and time   History of Anesthesia Complications (+) Emergence Delirium and history of anesthetic complications  Airway Mallampati: II  TM Distance: >3 FB Neck ROM: Full    Dental  (+) Teeth Intact, Dental Advisory Given   Pulmonary neg shortness of breath, sleep apnea and Continuous Positive Airway Pressure Ventilation , neg COPD, neg recent URI, former smoker   breath sounds clear to auscultation       Cardiovascular hypertension, Pt. on medications and Pt. on home beta blockers (-) angina + CAD  (-) CHF  Rhythm:Irregular     Neuro/Psych negative neurological ROS  negative psych ROS   GI/Hepatic negative GI ROS, Neg liver ROS,,,  Endo/Other  diabetes    Renal/GU Renal disease     Musculoskeletal negative musculoskeletal ROS (+)    Abdominal   Peds  Hematology  (+) Blood dyscrasia Xarelto  Lab Results      Component                Value               Date                      WBC                      9.4                 02/13/2018                HGB                      17.1                02/13/2018                HCT                      49.1                02/13/2018                MCV                      90                  02/13/2018                PLT                      197                 02/13/2018              Anesthesia Other Findings   Reproductive/Obstetrics                             Anesthesia Physical Anesthesia Plan  ASA: 2  Anesthesia Plan: General   Post-op Pain Management: Minimal or no pain anticipated   Induction: Intravenous  PONV Risk Score and Plan: 2 and Treatment may vary due to age or medical condition  Airway Management  Planned:  Mask  Additional Equipment: None  Intra-op Plan:   Post-operative Plan:   Informed Consent: I have reviewed the patients History and Physical, chart, labs and discussed the procedure including the risks, benefits and alternatives for the proposed anesthesia with the patient or authorized representative who has indicated his/her understanding and acceptance.     Dental advisory given  Plan Discussed with: CRNA  Anesthesia Plan Comments:        Anesthesia Quick Evaluation

## 2023-05-08 NOTE — CV Procedure (Signed)
[facility]: Anesthesia:  Dr Maple Hudson Propofol On Rx DOAC no missed doses Post Tikosyn load  DCC x 2 225 then 250J Converted from afib rate 85 to NSR rate 68 bpm  No immediate neurologic sequela  Charlton Haws MD [facility]

## 2023-05-08 NOTE — Progress Notes (Signed)
Pharmacy: Dofetilide (Tikosyn) - Follow Up Assessment and Electrolyte Replacement  Pharmacy consulted to assist in monitoring and replacing electrolytes in this 62 y.o. male admitted on 05/06/2023 undergoing dofetilide initiation. First dofetilide dose: 7/15  Labs:    Component Value Date/Time   K 4.7 05/08/2023 0013   MG 2.2 05/08/2023 0013     Plan: Potassium: K >/= 4: No additional supplementation needed  Magnesium: Mg > 2: No additional supplementation needed     Thank you for allowing pharmacy to participate in this patient's care   Alphia Moh, PharmD, BCPS, BCCP Clinical Pharmacist  Please check AMION for all [facility] Pharmacy phone numbers After 10:00 PM, call Main Pharmacy [phone]

## 2023-05-08 NOTE — Transfer of Care (Signed)
Immediate Anesthesia Transfer of Care Note  Patient: Christopher Phillips  Procedure(s) Performed: CARDIOVERSION  Patient Location: Cath Lab  Anesthesia Type:General  Level of Consciousness: awake and alert   Airway & Oxygen Therapy: Patient Spontanous Breathing  Post-op Assessment: Report given to RN and Post -op Vital signs reviewed and stable  Post vital signs: Reviewed and stable  Last Vitals:  Vitals Value Taken Time  BP 157/106 05/08/23 1321  Temp    Pulse 91 05/08/23 1321  Resp 17 05/08/23 1321  SpO2 95 % 05/08/23 1321  Vitals shown include unfiled device data.  Last Pain:  Vitals:   05/08/23 1106  TempSrc: Temporal  PainSc:          Complications: No notable events documented.

## 2023-05-08 NOTE — Progress Notes (Addendum)
Rounding Note    Patient Name: Christopher Phillips Date of Encounter: 05/08/2023  [facility] Cardiologist: Armanda Magic, MD   Subjective   Tolerating drug, no complaints  Inpatient Medications    Scheduled Meds:  allopurinol  300 mg Oral Daily   atorvastatin  80 mg Oral QPM   dofetilide  500 mcg Oral BID   lisinopril  20 mg Oral Daily   metoprolol succinate  50 mg Oral Daily   rivaroxaban  20 mg Oral Q supper   sodium chloride flush  3 mL Intravenous Q12H   Continuous Infusions:  sodium chloride     sodium chloride     PRN Meds: sodium chloride, acetaminophen, sodium chloride flush   Vital Signs    Vitals:   05/07/23 0818 05/07/23 1900 05/08/23 0500 05/08/23 0810  BP: 134/81 125/78 118/89 125/78  Pulse:  82 (!) 52 71  Resp: 16 15 16 20   Temp:  98.3 F (36.8 C) 98.7 F (37.1 C) 98 F (36.7 C)  TempSrc:  Oral Oral Oral  SpO2:    100%  Weight:      Height:       No intake or output data in the 24 hours ending 05/08/23 0835     05/06/2023    3:07 PM 05/06/2023   11:37 AM 03/25/2023    3:30 PM  Last 3 Weights  Weight (lbs) 279 lb 284 lb 12.8 oz 285 lb 12.8 oz  Weight (kg) 126.554 kg 129.184 kg 129.638 kg      Telemetry    AFib 30's overnight, beat to beat slowing, no significant/persistent bradycardia >> 70's-100's - Personally Reviewed  ECG    AFib 67bpm, QTc  - Personally Reviewed, with Dr. Nelly Laurence  Physical Exam   Exam remains unchanged GEN: No acute distress.   Neck: No JVD Cardiac: irreg-irreg, no murmurs, rubs, or gallops.  Respiratory: CTA b/l GI: Soft, nontender, non-distended  MS: No edema; No deformity. Neuro:  Nonfocal  Psych: Normal affect   Labs    High Sensitivity Troponin:  No results for input(s): "TROPONINIHS" in the last 720 hours.   Chemistry Recent Labs  Lab 05/06/23 1254 05/07/23 0000 05/08/23 0013  NA 137 138 138  K 4.6 3.7 4.7  CL 103 108 105  CO2 25 23 27   GLUCOSE 209* 113* 138*  BUN 12 13 11    CREATININE 0.85 0.75 0.87  CALCIUM 9.4 8.9 9.0  MG 2.0 2.1 2.2  GFRNONAA >60 >60 >60  ANIONGAP 9 7 6     Lipids No results for input(s): "CHOL", "TRIG", "HDL", "LABVLDL", "LDLCALC", "CHOLHDL" in the last 168 hours.  HematologyNo results for input(s): "WBC", "RBC", "HGB", "HCT", "MCV", "MCH", "MCHC", "RDW", "PLT" in the last 168 hours. Thyroid No results for input(s): "TSH", "FREET4" in the last 168 hours.  BNPNo results for input(s): "BNP", "PROBNP" in the last 168 hours.  DDimer No results for input(s): "DDIMER" in the last 168 hours.   Radiology      Cardiac Studies   05/07/23: TTE 1. Left ventricular ejection fraction, by estimation, is 55 to 60%. The  left ventricle has normal function. The left ventricle has no regional  wall motion abnormalities. Left ventricular diastolic function could not  be evaluated.   2. Right ventricular systolic function is normal. The right ventricular  size is normal.   3. Left atrial size was severely dilated.   4. Right atrial size was moderately dilated.   5. The mitral valve is  normal in structure. Trivial mitral valve  regurgitation. No evidence of mitral stenosis.   6. The aortic valve is tricuspid. There is mild calcification of the  aortic valve. Aortic valve regurgitation is not visualized. Aortic valve  sclerosis/calcification is present, without any evidence of aortic  stenosis.   7. Aortic dilatation noted. There is borderline dilatation of the aortic  root, measuring 39 mm.   8. The inferior vena cava is dilated in size with <50% respiratory  variability, suggesting right atrial pressure of 15 mmHg.    01/31/2015: TTE Study Conclusions  - Left ventricle: The cavity size was normal. Wall thickness was    normal. Systolic function was normal. The estimated ejection    fraction was in the range of 55% to 60%. Wall motion was normal;    there were no regional wall motion abnormalities.  - Left atrium: The atrium was mildly to  moderately dilated.  - Right ventricle: The cavity size was mildly dilated.  - Right atrium: The atrium was mildly dilated.   Impressions:  - Technically difficult; definity used; normal LV function;    biatrial enlargement; mild RVE; trace TR.   Patient Profile     62 y.o. 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 Mayford Knife for AAD management given 100% AFib burden on a monitor, felt to be a failure ofhis flecainide that he has been on for years.  His last visit with Dr. Johney Frame was 2016, his last SR EKG 2019, patient reports by symptoms, in/out of AFib, not felt to have been persistently in AFib all these years  Assessment & Plan    Persistent AFib CHA2DS2Vasc is 2, on Xarelto Tikosyn load is in progress K+ 3.7 Mag 2.1 Creat 0.75 QTc stable  DCCV today, pt remains agreeable I discussed the procedure, risks/benefits with the patient and his wife Anticipate d/c tomorrow   OSA Home CPAP at bedside  HTN Home meds   For questions or updates, please contact  [facility] Please consult [email or web address] for contact info under        Signed, Sheilah Pigeon, PA-C  05/08/2023, 8:35 AM

## 2023-05-08 NOTE — Interval H&P Note (Signed)
History and Physical Interval Note:  05/08/2023 11:20 AM  Christopher Phillips  has presented today for surgery, with the diagnosis of afib.  The various methods of treatment have been discussed with the patient and family. After consideration of risks, benefits and other options for treatment, the patient has consented to  Procedure(s): CARDIOVERSION (N/A) as a surgical intervention.  The patient's history has been reviewed, patient examined, no change in status, stable for surgery.  I have reviewed the patient's chart and labs.  Questions were answered to the patient's satisfaction.     Charlton Haws

## 2023-05-09 ENCOUNTER — Encounter (HOSPITAL_COMMUNITY): Payer: Self-pay | Admitting: Cardiovascular Disease

## 2023-05-09 ENCOUNTER — Other Ambulatory Visit (HOSPITAL_COMMUNITY): Payer: Self-pay

## 2023-05-09 DIAGNOSIS — I4819 Other persistent atrial fibrillation: Secondary | ICD-10-CM | POA: Diagnosis not present

## 2023-05-09 LAB — BASIC METABOLIC PANEL
Anion gap: 6 (ref 5–15)
BUN: 11 mg/dL (ref 8–23)
CO2: 25 mmol/L (ref 22–32)
Calcium: 8.9 mg/dL (ref 8.9–10.3)
Chloride: 105 mmol/L (ref 98–111)
Creatinine, Ser: 0.82 mg/dL (ref 0.61–1.24)
GFR, Estimated: >60 mL/min (ref >60)
Glucose, Bld: 127 mg/dL — ABNORMAL HIGH (ref 70–99)
Potassium: 3.8 mmol/L (ref 3.5–5.1)
Sodium: 136 mmol/L (ref 135–145)

## 2023-05-09 LAB — MAGNESIUM: Magnesium: 2.1 mg/dL (ref 1.7–2.4)

## 2023-05-09 MED ORDER — POTASSIUM CHLORIDE CRYS ER 20 MEQ PO TBCR
40.0000 meq | EXTENDED_RELEASE_TABLET | Freq: Once | ORAL | Status: AC
  Administered 2023-05-09: 40 meq via ORAL
  Filled 2023-05-09: qty 2

## 2023-05-09 MED ORDER — POTASSIUM CHLORIDE CRYS ER 20 MEQ PO TBCR
20.0000 meq | EXTENDED_RELEASE_TABLET | Freq: Every day | ORAL | 5 refills | Status: DC
  Filled 2023-05-09: qty 30, 30d supply, fill #0

## 2023-05-09 MED ORDER — DOFETILIDE 500 MCG PO CAPS
500.0000 ug | ORAL_CAPSULE | Freq: Two times a day (BID) | ORAL | 5 refills | Status: DC
  Filled 2023-05-09: qty 60, 30d supply, fill #0

## 2023-05-09 NOTE — Progress Notes (Signed)
Explained discharge instructions to patient. Reviewed follow up appointment and next medication administration times. Also reviewed education. Patient verbalized having an understanding for instructions given. All belongings are in the patient's possession. Will pick up patient's TOC meds when transporting downstairs for discharge. IV and telemetry were removed. CCMD was notified. No other needs verbalized. Transporting downstairs for discharge.

## 2023-05-09 NOTE — Progress Notes (Signed)
Pharmacy: Dofetilide (Tikosyn) - Follow Up Assessment and Electrolyte Replacement  Pharmacy consulted to assist in monitoring and replacing electrolytes in this 62 y.o. male admitted on 05/06/2023 undergoing dofetilide initiation. First dofetilide dose: 40 mEq  Labs:    Component Value Date/Time   K 3.8 05/09/2023 0135   MG 2.1 05/09/2023 0135     Plan: Potassium: K 3.8-3.9:  Give KCl 40 mEq po x1   Magnesium: Mg > 2: No additional supplementation needed   As patient has required on average 20 mEq of potassium replacement every day, recommend discharging patient with prescription for:  Potassium chloride 20 mEq  daily  Thank you for allowing pharmacy to participate in this patient's care   Merla Riches, PharmD 05/09/2023  8:16 AM

## 2023-05-09 NOTE — Plan of Care (Signed)

## 2023-05-09 NOTE — Plan of Care (Signed)

## 2023-05-09 NOTE — Discharge Summary (Signed)
ELECTROPHYSIOLOGY PROCEDURE DISCHARGE SUMMARY    Patient ID: Christopher Phillips,  MRN: [id number], DOB/AGE: 05/10/1961 62 y.o.  Admit date: 05/06/2023 Discharge date: 05/09/2023  Primary Care Physician: Daryll Drown, NP (Inactive)  Primary Cardiologist: Dr. Mayford Knife Electrophysiologist: new, Dr. Nelly Laurence  Primary Discharge Diagnosis:  1.  persistent atrial fibrillation status post Tikosyn loading this admission      CHA2DS2Vasc is 2, on Xarelto  Secondary Discharge Diagnosis:  OSA HTN  No Known Allergies   Procedures This Admission:  1.  Tikosyn loading 2.  Direct current cardioversion on 05/08/23 by Dr Eden Emms which successfully restored SR.  There were no early apparent complications.   Brief HPI: Christopher Phillips is a 62 y.o. male with a past medical history as noted above.  They were referred to EP in the outpatient setting for treatment options of atrial fibrillation.  Risks, benefits, and alternatives to Tikosyn were reviewed with the patient who wished to proceed.    Hospital Course:  The patient was admitted and Tikosyn was initiated.  Renal function and electrolytes were followed during the hospitalization.  The patient's QTc remained stable.  On 05/08/23 the patient underwent direct current cardioversion which restored sinus rhythm.  He was monitored until discharge on telemetry which demonstrated.  On the day of discharge, feels well, was examined by Dr Nelly Laurence who considered the patient stable for discharge to home.  Follow-up has been arranged with the AFib clinic in 1 week and with Dr Nelly Laurence in 4 weeks.   Tikosyn teaching was completed electrolyte replacement for home will be KDue daily   Physical Exam: Vitals:   05/09/23 0609 05/09/23 0619 05/09/23 0622 05/09/23 0820  BP: 100/68 131/80 131/80 124/70  Pulse:  65  70  Resp:    18  Temp: 98.2 F (36.8 C)   97.7 F (36.5 C)  TempSrc: Oral   Oral  SpO2:      Weight:      Height:         GEN- The patient is  well appearing, alert and oriented x 3 today.   HEENT: normocephalic, atraumatic; sclera clear, conjunctiva pink; hearing intact; oropharynx clear; neck supple, no JVP Lymph- no cervical lymphadenopathy Lungs- CTA b/l, normal work of breathing.  No wheezes, rales, rhonchi Heart- RRR, no murmurs, rubs or gallops, PMI not laterally displaced GI- soft, non-tender, non-distended Extremities- no clubbing, cyanosis, or edema MS- no significant deformity or atrophy Skin- warm and dry, no rash or lesion Psych- euthymic mood, full affect Neuro- strength and sensation are intact   Labs:   Lab Results  Component Value Date   WBC 9.4 02/13/2018   HGB 17.1 02/13/2018   HCT 49.1 02/13/2018   MCV 90 02/13/2018   PLT 197 02/13/2018    Recent Labs  Lab 05/09/23 0135  NA 136  K 3.8  CL 105  CO2 25  BUN 11  CREATININE 0.82  CALCIUM 8.9  GLUCOSE 127*     Discharge Medications:  Allergies as of 05/09/2023   No Known Allergies      Medication List     TAKE these medications    allopurinol 300 MG tablet Commonly known as: ZYLOPRIM Take 300 mg by mouth daily.   atorvastatin 80 MG tablet Commonly known as: LIPITOR Take 1 tablet (80 mg total) by mouth every evening.   Biotin 10 MG Caps Take 1 capsule by mouth daily.   Coenzyme Q10 400 MG Caps Taking 1 capsule by mouth  daily   dofetilide 500 MCG capsule Commonly known as: TIKOSYN Take 1 capsule (500 mcg total) by mouth 2 (two) times daily.   fluticasone 50 MCG/ACT nasal spray Commonly known as: FLONASE Place 2 sprays into both nostrils daily. What changed:  when to take this reasons to take this   lisinopril 20 MG tablet Commonly known as: ZESTRIL Take 1 tablet (20 mg total) by mouth daily.   Magnesium Carbonate Powd Taking 1-2 Tablespoons by mouth twice monthly   metoprolol succinate 50 MG 24 hr tablet Commonly known as: TOPROL-XL TAKE 1 TABLET BY MOUTH DAILY  WITH OR IMMEDIATELY FOLLOWING A  MEAL    MULTIVITAMIN ADULTS PO Take 1 tablet by mouth daily. Focus Factor   potassium chloride SA 20 MEQ tablet Commonly known as: KLOR-CON M Take 1 tablet (20 mEq total) by mouth daily.   rivaroxaban 20 MG Tabs tablet Commonly known as: Xarelto Take 1 tablet (20 mg total) by mouth daily with supper.   Tylenol 8 Hour 650 MG CR tablet Generic drug: acetaminophen 1,300 mg as needed. Arthritis Tylenol   vitamin C 1000 MG tablet Take 1,000 mg by mouth daily.        Disposition: Home Discharge Instructions     Diet - low sodium heart healthy   Complete by: As directed    Increase activity slowly   Complete by: As directed         Duration of Discharge Encounter: Greater than 30 minutes including physician time.  Norma Fredrickson, PA-C 05/09/2023 11:04 AM

## 2023-05-09 NOTE — Progress Notes (Signed)
Dr. Nelly Laurence has seen the patient, reviewed EKG,  We discussed telemetry, blocked PAC and one event of dropped QRS only early AM hours No AV block, significant bradycardia  Anticipate discharge this afternoon pending his final EKG  Francis Dowse, PA-C

## 2023-05-10 ENCOUNTER — Telehealth: Payer: Self-pay

## 2023-05-10 NOTE — Transitions of Care (Post Inpatient/ED Visit) (Signed)
05/10/2023  Name: Christopher Phillips MRN: [id number] DOB: 1960/11/03  Today's TOC FU Call Status: Today's TOC FU Call Status:: Successful TOC FU Call Competed TOC FU Call Complete Date: 05/10/23  Transition Care Management Follow-up Telephone Call Date of Discharge: 05/09/23 Discharge Facility[facility]) Type of Discharge: Inpatient Admission Primary Inpatient Discharge Diagnosis:: Atrial Fibrillation How have you been since you were released from the hospital?: Better (Patient states he feels great!) Any questions or concerns?: No  Items Reviewed: Did you receive and understand the discharge instructions provided?: Yes Medications obtained,verified, and reconciled?: Yes (Medications Reviewed) Any new allergies since your discharge?: No Dietary orders reviewed?: No Do you have support at home?: Yes People in Home: spouse Name of Support/Comfort Primary Source: Lorie  Medications Reviewed Today: Medications Reviewed Today     Reviewed by Jodelle Gross, RN (Case Manager) on 05/10/23 at 1114  Med List Status: <None>   Medication Order Taking? Sig Documenting Provider Last Dose Status Informant  acetaminophen (TYLENOL 8 HOUR) 650 MG CR tablet [id number] Yes 1,300 mg as needed. Arthritis Tylenol [provider] Taking Active   allopurinol (ZYLOPRIM) 300 MG tablet [id number] Yes Take 300 mg by mouth daily. [provider] Taking Active   Ascorbic Acid (VITAMIN C) 1000 MG tablet [id number] Yes Take 1,000 mg by mouth daily. [provider] Taking Active   atorvastatin (LIPITOR) 80 MG tablet [id number] Yes Take 1 tablet (80 mg total) by mouth every evening. Quintella Reichert, MD Taking Active   Biotin 10 MG CAPS [id number] Yes Take 1 capsule by mouth daily. [provider] Taking Active   Coenzyme Q10 400 MG CAPS [id number] Yes Taking 1 capsule by mouth daily [provider] Taking Active   dofetilide (TIKOSYN) 500 MCG capsule [id number] Yes Take 1  capsule (500 mcg total) by mouth 2 (two) times daily. Sheilah Pigeon, PA-C Taking Active   fluticasone [facility]) 50 MCG/ACT nasal spray [id number] Yes Place 2 sprays into both nostrils daily.  Patient taking differently: Place 2 sprays into both nostrils as needed.   Elenora Gamma, MD Taking Active   lisinopril (ZESTRIL) 20 MG tablet [id number] Yes Take 1 tablet (20 mg total) by mouth daily. Eustace Pen, PA-C Taking Active   Magnesium Carbonate POWD [id number] No Taking 1-2 Tablespoons by mouth twice monthly  Patient not taking: Reported on 05/10/2023   [provider] Not Taking Active            Med Note Kendal Hymen   Mon May 06, 2023  2:06 PM) Stopped taking >1 month ago  metoprolol succinate (TOPROL-XL) 50 MG 24 hr tablet [id number] Yes TAKE 1 TABLET BY MOUTH DAILY  WITH OR IMMEDIATELY FOLLOWING A  MEAL Turner, Cornelious Bryant, MD Taking Active   Multiple Vitamins-Minerals (MULTIVITAMIN ADULTS PO) [id number] Yes Take 1 tablet by mouth daily. Focus Factor [provider] Taking Active   potassium chloride SA (KLOR-CON M) 20 MEQ tablet [id number] Yes Take 1 tablet (20 mEq total) by mouth daily. Sheilah Pigeon, PA-C Taking Active   rivaroxaban (XARELTO) 20 MG TABS tablet [id number] Yes Take 1 tablet (20 mg total) by mouth daily with supper. Eustace Pen, PA-C Taking Active             Home Care and Equipment/Supplies: Were Home Health Services Ordered?: No Any new equipment or medical supplies ordered?: No  Functional Questionnaire: Do you need assistance with bathing/showering or dressing?: No Do you need assistance with meal preparation?:  No Do you need assistance with eating?: No Do you have difficulty maintaining continence: No Do you need assistance with getting out of bed/getting out of a chair/moving?: No Do you have difficulty managing or taking your medications?: No  Follow up appointments reviewed: PCP Follow-up appointment confirmed?:  [facility] Specialist Hospital Follow-up appointment confirmed?: Yes Date of Specialist follow-up appointment?: 05/17/23 Follow-Up Specialty Provider:: Jorja Loa, PA-C- Afib clinic Do you need transportation to your follow-up appointment?: No Do you understand care options if your condition(s) worsen?: Yes-patient verbalized understanding  SDOH Interventions Today    Flowsheet Row Most Recent Value  SDOH Interventions   Food Insecurity Interventions Intervention Not Indicated  Housing Interventions Intervention Not Indicated      Jodelle Gross, RN, BSN, CCM Care Management Coordinator [facility] Phone: [phone]/Fax: [phone]

## 2023-05-11 NOTE — Anesthesia Postprocedure Evaluation (Signed)
Anesthesia Post Note  Patient: Christopher Phillips  Procedure(s) Performed: CARDIOVERSION     Patient location during evaluation: Cath Lab Anesthesia Type: General Level of consciousness: awake and alert Pain management: pain level controlled Vital Signs Assessment: post-procedure vital signs reviewed and stable Respiratory status: spontaneous breathing, nonlabored ventilation and respiratory function stable Cardiovascular status: blood pressure returned to baseline and stable Postop Assessment: no apparent nausea or vomiting Anesthetic complications: no   No notable events documented.  Last Vitals:  Vitals:   05/09/23 0622 05/09/23 0820  BP: 131/80 124/70  Pulse:  70  Resp:  18  Temp:  36.5 C  SpO2:      Last Pain:  Vitals:   05/09/23 0820  TempSrc: Oral  PainSc:                  Raghav Verrilli

## 2023-05-17 ENCOUNTER — Ambulatory Visit (HOSPITAL_COMMUNITY)
Admit: 2023-05-17 | Discharge: 2023-05-17 | Disposition: A | Discharge status: Home / Self Care | Payer: BC Managed Care – PPO | Attending: Physician Assistant | Admitting: Physician Assistant

## 2023-05-17 ENCOUNTER — Encounter (HOSPITAL_COMMUNITY): Payer: Self-pay | Admitting: Physician Assistant

## 2023-05-17 VITALS — BP 118/78 | HR 68 | Ht 72.0 in | Wt 277.8 lb

## 2023-05-17 DIAGNOSIS — Z7901 Long term (current) use of anticoagulants: Secondary | ICD-10-CM | POA: Insufficient documentation

## 2023-05-17 DIAGNOSIS — Z79899 Other long term (current) drug therapy: Secondary | ICD-10-CM | POA: Insufficient documentation

## 2023-05-17 DIAGNOSIS — D6869 Other thrombophilia: Secondary | ICD-10-CM | POA: Diagnosis not present

## 2023-05-17 DIAGNOSIS — Z5181 Encounter for therapeutic drug level monitoring: Secondary | ICD-10-CM | POA: Diagnosis not present

## 2023-05-17 DIAGNOSIS — I4819 Other persistent atrial fibrillation: Secondary | ICD-10-CM | POA: Diagnosis not present

## 2023-05-17 DIAGNOSIS — I2510 Atherosclerotic heart disease of native coronary artery without angina pectoris: Secondary | ICD-10-CM | POA: Insufficient documentation

## 2023-05-17 DIAGNOSIS — G4733 Obstructive sleep apnea (adult) (pediatric): Secondary | ICD-10-CM | POA: Diagnosis not present

## 2023-05-17 DIAGNOSIS — I440 Atrioventricular block, first degree: Secondary | ICD-10-CM | POA: Insufficient documentation

## 2023-05-17 DIAGNOSIS — E119 Type 2 diabetes mellitus without complications: Secondary | ICD-10-CM | POA: Insufficient documentation

## 2023-05-17 DIAGNOSIS — E785 Hyperlipidemia, unspecified: Secondary | ICD-10-CM | POA: Diagnosis not present

## 2023-05-17 DIAGNOSIS — I10 Essential (primary) hypertension: Secondary | ICD-10-CM | POA: Diagnosis not present

## 2023-05-17 LAB — BASIC METABOLIC PANEL
Anion gap: 8 (ref 5–15)
BUN: 10 mg/dL (ref 8–23)
CO2: 26 mmol/L (ref 22–32)
Calcium: 9.5 mg/dL (ref 8.9–10.3)
Chloride: 105 mmol/L (ref 98–111)
Creatinine, Ser: 0.75 mg/dL (ref 0.61–1.24)
GFR, Estimated: >60 mL/min (ref >60)
Glucose, Bld: 125 mg/dL — ABNORMAL HIGH (ref 70–99)
Potassium: 4.2 mmol/L (ref 3.5–5.1)
Sodium: 139 mmol/L (ref 135–145)

## 2023-05-17 LAB — MAGNESIUM: Magnesium: 2.3 mg/dL (ref 1.7–2.4)

## 2023-05-17 MED ORDER — DOFETILIDE 500 MCG PO CAPS: 500.0000 ug | ORAL_CAPSULE | Freq: Two times a day (BID) | ORAL | 1 refills | Status: DC

## 2023-05-17 NOTE — Progress Notes (Signed)
Primary Care Physician: Daryll Drown, NP (Inactive) Primary Cardiologist: Dr. Mayford Knife Primary Electrophysiologist: Dr. Nelly Laurence Referring Physician: Dr. Emilio Math Christopher Phillips is a 62 y.o. male with a history of HTN, HLD, OSA on CPAP, T2DM, CAD, and persistent atrial fibrillation who presents for follow up in the [facility]. Cardiac monitor placed by Dr. Mayford Knife 5/1-12/24 showed 100% Afib burden. He has been on flecainide since 2016. Patient is on Xarelto 20 mg daily for a CHADS2VASC score of 3.  On follow up today, patient is s/p dofetilide loading 7/15-7/18/24 with DCCV on 05/08/23. Patient reports that he has done well since discharge. He remains in SR today. No bleeding issues on anticoagulation.   Today, he denies symptoms of palpitations, chest pain, shortness of breath, orthopnea, PND, lower extremity edema, dizziness, presyncope, syncope, snoring, daytime somnolence, bleeding, or neurologic sequela. The patient is tolerating medications without difficulties and is otherwise without complaint today.   Atrial Fibrillation Risk Factors:  he does have symptoms or diagnosis of sleep apnea. he is compliant with CPAP therapy. he does not have a history of rheumatic fever. he does not have a history of alcohol use. The patient does not have a history of early familial atrial fibrillation or other arrhythmias.   Atrial Fibrillation Management history:  Previous antiarrhythmic drugs: Flecainide, dofetilide  Previous cardioversions: 05/08/23 Previous ablations: None Anticoagulation history: Xarelto 20 mg daily   Past Medical History:  Diagnosis Date   CAD (coronary artery disease)    a. nonobstructive by cath 01/2015.   Complication of anesthesia    slow to wake up   Diabetes mellitus (HCC)    a. A1c 6.5 in 01/2015.   History of kidney stones    Hyperlipidemia    Hypertension    Morbid obesity (HCC)    OSA on CPAP    severe with AHI 24.84/hr now on  CPAP at 13cm H2O   Persistent atrial fibrillation (HCC)    a. Started on flecainide - developed AF RVR with GXT. Increased to 100mg  BID, started on Xarelto. CHADSVASC = 3  (HTN , DM and nonobstructive CAD)    ROS- All systems are reviewed and negative except as per the HPI above.  Physical Exam: Vitals:   05/17/23 1326  BP: 118/78  Pulse: 68  Weight: 126 kg  Height: 6' (1.829 m)     GEN: Well nourished, well developed in no acute distress NECK: No JVD; No carotid bruits CARDIAC: Regular rate and rhythm, no murmurs, rubs, gallops RESPIRATORY:  Clear to auscultation without rales, wheezing or rhonchi  ABDOMEN: Soft, non-tender, non-distended EXTREMITIES:  No edema; No deformity    Wt Readings from Last 3 Encounters:  05/17/23 126 kg  05/08/23 127 kg  05/06/23 129.2 kg    EKG today demonstrates  SR, PAC, 1st degree AV block Vent. rate 68 BPM PR interval 206 ms QRS duration 86 ms QT/QTcB 418/444 ms   Echo 01/31/2015 demonstrated: Study Conclusions  - Left ventricle: The cavity size was normal. Wall thickness was    normal. Systolic function was normal. The estimated ejection    fraction was in the range of 55% to 60%. Wall motion was normal;    there were no regional wall motion abnormalities.  - Left atrium: The atrium was mildly to moderately dilated.  - Right ventricle: The cavity size was mildly dilated.  - Right atrium: The atrium was mildly dilated.   Impressions:  - Technically difficult; definity used; normal  LV function;    biatrial enlargement; mild RVE; trace TR.   Cardiac monitor 5/1-12/24:   Predominant rhythm was atrial fibrillation with 100% afib  burden with average heart rate 78bpm.   Rare PVCs   Pauses up to 3.2 seconds during sleep.  Epic records are reviewed at length today.  CHA2DS2-VASc Score = 3  The patient's score is based upon: CHF History: 0 HTN History: 1 Diabetes History: 1 Stroke History: 0 Vascular Disease History: 1 Age  Score: 0 Gender Score: 0     ASSESSMENT AND PLAN: Persistent Atrial Fibrillation (ICD10:  I48.19) The patient's CHA2DS2-VASc score is 3, indicating a 3.2% annual risk of stroke.   S/p dofetilide loading 7/15-7/18/24 with DCCV on 05/08/23 Patient appears to be maintaining SR Continue dofetilide 500 mcg BID, QT stable Check bmet/mag today Continue Toprol 50 mg daily Continue Xarelto 20 mg daily  Secondary Hypercoagulable State (ICD10:  D68.69) The patient is at significant risk for stroke/thromboembolism based upon his CHA2DS2-VASc Score of 3.  Continue Rivaroxaban (Xarelto).   OSA  Encouraged nightly CPAP  HTN Stable on current regimen   Follow up with Dr Nelly Laurence as scheduled.    Jorja Loa PA-C [facility] [address] [place], [place] [address] [phone] 05/17/2023 1:43 PM

## 2023-05-22 ENCOUNTER — Encounter (HOSPITAL_COMMUNITY): Payer: Self-pay

## 2023-05-22 ENCOUNTER — Telehealth: Payer: Self-pay | Admitting: Cardiology

## 2023-05-22 ENCOUNTER — Telehealth (HOSPITAL_COMMUNITY): Payer: Self-pay

## 2023-05-22 MED ORDER — DILTIAZEM HCL 30 MG PO TABS: ORAL_TABLET | ORAL | 1 refills | Status: AC

## 2023-05-22 NOTE — Telephone Encounter (Signed)
Called patient about message. Patient complained of dizziness when squatting and standing. He stated his BP is better now BP 132/70 and HR 60-80. Patient stated he needs a medical letter sent to the nurse at work since he has not been about to do his job, since it involves climbing towers, and stuff like that, were patient does not feel comfortable right now to do his job without getting dizzy. He stated please send letter to [facility] the plant nurse. He gave her name phone number and fax.  Will send to Dr. Mayford Knife and her nurse.    Olegario Messier- plant nurse at [employer]- [phone] Fax- [phone]

## 2023-05-22 NOTE — Telephone Encounter (Signed)
Patient c/o Palpitations:  STAT if patient reporting lightheadedness, shortness of breath, or chest pain  How long have you had palpitations/irregular HR/ Afib? Are you having the symptoms now?   Yes.  Since last night  Are you currently experiencing lightheadedness, SOB or CP?   Gets dizzy when going to stand up  Do you have a history of afib (atrial fibrillation) or irregular heart rhythm?   Yes  Have you checked your BP or HR? (document readings if available):   Oxygen 97, HR 87-90, BP 146/88  Are you experiencing any other symptoms?  Feels tired   Patient stated his HR has been around 66 at rest and 100-120 when moving around.  Patient wants to get documentation to be excused from work.

## 2023-05-22 NOTE — Telephone Encounter (Signed)
Patient called to let us know he is in A-fib. This started last night. His HR is ranging around 80's-90's and B/P 146/100. He is feeling light headed and dizzy at times. Instructed patient to take Cardizem 30 mg as needed for his A-fib. I have sent in Cardizem 30 mg tablet to [facility] in [place]. He was told to take it only if his heart rate is above 100 and top number blood pressure needs to be above 100. We are writing him out of work today and tomorrow for his A-fib. I have faxed letter to his nurse Olegario Messier at [employer]. Patient advised regarding paperwork for medical leave he would need to reach out to Dr. Norris Cross clinic. Consulted with patient and he verbalized understanding.

## 2023-05-23 NOTE — Telephone Encounter (Signed)
Discussed w/ mngt. Forwarding to Dr. Morrie Sheldon nurse to discuss with him next week, to see if he might be willing to aid in some temporary fmla d/t afib symptoms. I did speak with Christopher Phillips and let him know, informed that typically we do not do fmla for afib, but MD might be willing to give him a month/two of fmla while getting better control of his afib/symptoms. Aware nurse will follow up with him next week after discussing w/ Dr. Nelly Laurence. Christopher Phillips appreciates the help/consideration.

## 2023-05-23 NOTE — Telephone Encounter (Signed)
Please let me know if Dr. Nelly Laurence will be completing the FMLA form so I can call the patient.  He will not sign the ROI or pay the forms fee until he hears from me.

## 2023-05-28 NOTE — Telephone Encounter (Signed)
Spoke with patient, Dr Nelly Laurence doesn't provide FMLA paperwork for afib. Patient already scheduled to see Mealor on 8/19 and will discuss at that time with him. No further needs today.

## 2023-05-30 ENCOUNTER — Telehealth: Payer: Self-pay | Admitting: Nurse Practitioner

## 2023-05-30 NOTE — Telephone Encounter (Signed)
Ok to schedule this exception.  Though please inform it may take a bit for him to get into my clinic.  As long as he is ok with this. Ok to schedule.  Make sure he has a appt slot with me.

## 2023-05-31 NOTE — Telephone Encounter (Signed)
Tried calling pt to schedule 30 min appt with Dr Nadine Counts to re-establish care but pt did not answer and VM full.

## 2023-06-10 ENCOUNTER — Ambulatory Visit: Payer: BC Managed Care – PPO | Admitting: Cardiovascular Disease

## 2023-06-10 ENCOUNTER — Encounter: Payer: Self-pay | Admitting: Cardiovascular Disease

## 2023-06-10 VITALS — BP 140/76 | HR 65 | Ht 72.0 in | Wt 278.6 lb

## 2023-06-10 DIAGNOSIS — I4819 Other persistent atrial fibrillation: Secondary | ICD-10-CM | POA: Diagnosis not present

## 2023-06-10 NOTE — Patient Instructions (Signed)
Medication Instructions:  Your physician recommends that you continue on your current medications as directed. Please refer to the Current Medication list given to you today. *If you need a refill on your cardiac medications before your next appointment, please call your pharmacy*   Testing/Procedures: Atrial Fibrillation Ablation - We will contact you to set this up when the hospital schedule is open for next year Your physician has recommended that you have an ablation. Catheter ablation is a medical procedure used to treat some cardiac arrhythmias (irregular heartbeats). During catheter ablation, a long, thin, flexible tube is put into a blood vessel in your groin (upper thigh), or neck. This tube is called an ablation catheter. It is then guided to your heart through the blood vessel. Radio frequency waves destroy small areas of heart tissue where abnormal heartbeats may cause an arrhythmia to start. Please see the instruction sheet given to you today.   Follow-Up: At [facility], you and your health needs are our priority.  As part of our continuing mission to provide you with exceptional heart care, we have created designated Provider Care Teams.  These Care Teams include your primary Cardiologist (physician) and Advanced Practice Providers (APPs -  Physician Assistants and Nurse Practitioners) who all work together to provide you with the care you need, when you need it.  We recommend signing up for the patient portal called "MyChart".  Sign up information is provided on this After Visit Summary.  MyChart is used to connect with patients for Virtual Visits (Telemedicine).  Patients are able to view lab/test results, encounter notes, upcoming appointments, etc.  Non-urgent messages can be sent to your provider as well.   To learn more about what you can do with MyChart, go to [email or web address].    Your next appointment:   We will contact you to set this up when scheduling is  available  Provider:   [name], MD

## 2023-06-10 NOTE — Progress Notes (Signed)
Electrophysiology Office Note:    Date:  06/10/2023   ID:  Christopher Phillips, DOB [birth year], MRN [id number]  PCP:  Daryll Drown, NP (Inactive)   [facility] Providers Cardiologist:  Armanda Magic, MD     Referring MD: No ref. provider found   History of Present Illness:    Christopher Phillips is a 62 y.o. male with a medical history significant for persistent atrial fibrillation, OSA on CPAP, hypertension, CAD, who presents for electrophysiology follow-up.     He has seen Dr. Mayford Knife in the past for atrial fibrillation and was maintained on flecainide.  He was lost to follow-up for 2 years and represented in May 2024 persistently in atrial fibrillation.  A monitor was placed and showed 100% A-fib burden.  He was referred to atrial fibrillation clinic where flecainide was stopped, and he was admitted for Tikosyn load which took place in July 2024.     He is very symptomatic with atrial fibrillation.  Feels lightheaded, dizzy, extremely fatigued.  His rates in AF are not well-controlled, often times up to 150 bpm.  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  EKGs/Labs/Other Studies Reviewed Today:    Echocardiogram:  TTE 05/07/2023 Ejection fraction of 55 to 60%.  Severe left atrial dilation, right atrium is moderately dilated.  No evidence of significant valvular disease   Monitors:  Zio 03/10/2023 Heart percent atrial fibrillation, heart's ranging between 36 and 181 bpm, average 78 beats minute  Stress testing:  Reviewed in Epic  Advanced imaging:  Cardiac catherization   EKG:   EKG Interpretation Date/Time:  Monday June 10 2023 14:42:01 EDT Ventricular Rate:  65 PR Interval:  208 QRS Duration:  92 QT Interval:  418 QTC Calculation: 434 R Axis:   62  Text Interpretation: Sinus rhythm with marked sinus arrhythmia Septal infarct (cited on or  before 08-May-2023) When compared with ECG of 17-May-2023 13:35, No significant change was found Confirmed by [name] [id number]) on 06/10/2023 3:19:51 PM     Physical Exam:    VS:  BP (!) 140/76 (BP Location: Left Arm, Patient Position: Sitting, Cuff Size: Large)   Pulse 65   Ht 6' (1.829 m)   Wt 278 lb 9.6 oz (126.4 kg)   SpO2 95%   BMI 37.78 kg/m     Wt Readings from Last 3 Encounters:  06/10/23 278 lb 9.6 oz (126.4 kg)  05/17/23 277 lb 12.8 oz (126 kg)  05/08/23 280 lb (127 kg)     GEN:  Well nourished, well developed in no acute distress CARDIAC: RRR, no murmurs, rubs, gallops RESPIRATORY:  Normal work of breathing MUSCULOSKELETAL: no edema    ASSESSMENT & PLAN:    Persistent atrial fibrillation Symptomatic Failed flecainide Began Tikosyn July 2024 He continues to have recurrences of atrial fibrillation despite the Tikosyn We discussed management options moving forward.  I recommended ablation since he has failed 2 medications now.  We discussed the indication, rationale, logistics, anticipated benefits, and potential risks of the ablation procedure including but not limited to -- bleed at the groin access site, chest pain, damage to nearby organs such as the diaphragm, lungs, or esophagus, need for a drainage tube, or prolonged hospitalization. I explained that the risk for stroke, heart attack, need for open chest surgery, or even death is very low but not zero. he  expressed understanding and wishes to proceed.   Secondary  hypercoagulable state Continue Xarelto 20 mg daily for CHA2DS2-VASc score of 3  Struct of sleep apnea Compliance with CPAP encouraged Discussed the importance of sleep apnea management to prevent recurrence of atrial fibrillation  Obesity Discussed the importance of weight loss for durable maintenance of sinus rhythm    Signed, Maurice Small, MD  06/10/2023 3:43 PM     [facility]

## 2023-06-21 ENCOUNTER — Other Ambulatory Visit: Payer: Self-pay

## 2023-06-21 MED ORDER — POTASSIUM CHLORIDE CRYS ER 20 MEQ PO TBCR: 20.0000 meq | EXTENDED_RELEASE_TABLET | Freq: Every day | ORAL | 3 refills | Status: DC

## 2023-07-29 ENCOUNTER — Ambulatory Visit: Payer: BC Managed Care – PPO | Admitting: Family Medicine

## 2023-08-02 ENCOUNTER — Telehealth: Payer: Self-pay

## 2023-08-02 ENCOUNTER — Telehealth: Payer: Self-pay | Admitting: Cardiovascular Disease

## 2023-08-02 DIAGNOSIS — I4819 Other persistent atrial fibrillation: Secondary | ICD-10-CM

## 2023-08-02 NOTE — Telephone Encounter (Signed)
Spoke with patient about scheduling ablation - doesn't want to schedule at this time but will call office back in 1-2 weeks to get this done. No questions at this time

## 2023-08-02 NOTE — Telephone Encounter (Signed)
Pt calling back to schedule an appt for his ablation. Please advise

## 2023-08-05 NOTE — Telephone Encounter (Signed)
Pt's Afib Ablation has been scheduled on 12/3 at 10:30.  CT is scheduled on 11/7. Pt will come to [facility] for labs on 11/5.  Instruction letters will be mailed to pt's home address per his request.   I have sent a MyChart activation code to his Wife's phone per his request so she can activate and access it - pt has a flip phone.

## 2023-08-19 ENCOUNTER — Ambulatory Visit: Payer: BC Managed Care – PPO | Admitting: Family Medicine

## 2023-08-27 ENCOUNTER — Ambulatory Visit: Payer: BC Managed Care – PPO | Attending: Cardiovascular Disease

## 2023-08-27 DIAGNOSIS — I4819 Other persistent atrial fibrillation: Secondary | ICD-10-CM | POA: Diagnosis not present

## 2023-08-28 LAB — BASIC METABOLIC PANEL
BUN/Creatinine Ratio: 12 (ref 10–24)
BUN: 10 mg/dL (ref 8–27)
CO2: 23 mmol/L (ref 20–29)
Calcium: 9.7 mg/dL (ref 8.6–10.2)
Chloride: 102 mmol/L (ref 96–106)
Creatinine, Ser: 0.81 mg/dL (ref 0.76–1.27)
Glucose: 101 mg/dL — ABNORMAL HIGH (ref 70–99)
Potassium: 5.1 mmol/L (ref 3.5–5.2)
Sodium: 140 mmol/L (ref 134–144)
eGFR: 98 mL/min/{1.73_m2} (ref >59)

## 2023-08-28 LAB — CBC
Hematocrit: 43.2 % (ref 37.5–51.0)
Hemoglobin: 14.6 g/dL (ref 13.0–17.7)
MCH: 32.4 pg (ref 26.6–33.0)
MCHC: 33.8 g/dL (ref 31.5–35.7)
MCV: 96 fL (ref 79–97)
Platelets: 162 10*3/uL (ref 150–450)
RBC: 4.51 x10E6/uL (ref 4.14–5.80)
RDW: 12.7 % (ref 11.6–15.4)
WBC: 7.1 10*3/uL (ref 3.4–10.8)

## 2023-08-29 ENCOUNTER — Encounter (HOSPITAL_COMMUNITY): Payer: Self-pay

## 2023-08-29 ENCOUNTER — Ambulatory Visit (HOSPITAL_COMMUNITY)
Admission: RE | Admit: 2023-08-29 | Discharge: 2023-08-29 | Disposition: A | Discharge status: Home / Self Care | Payer: BC Managed Care – PPO | Source: Ambulatory Visit | Attending: Cardiovascular Disease | Admitting: Cardiovascular Disease

## 2023-08-29 DIAGNOSIS — I4819 Other persistent atrial fibrillation: Secondary | ICD-10-CM

## 2023-08-30 ENCOUNTER — Telehealth: Payer: Self-pay

## 2023-08-30 NOTE — Telephone Encounter (Signed)
Pt went for his CT scan and notified the tech that he had an itch on his arm the last time he had contrast so they cancelled his CT.  He can not take Benadryl since he is on Tikosyn.   Per Dr. Nelly Laurence he wants the pt scheduled for a TEE same day as Ablation.   I will message Daun Peacock and get this arranged.   I called pt and he is aware of this procedure and the reason why it was changed. He is ok to proceed.

## 2023-09-02 ENCOUNTER — Ambulatory Visit (HOSPITAL_COMMUNITY): Admission: RE | Admit: 2023-09-02 | Payer: BC Managed Care – PPO | Source: Ambulatory Visit

## 2023-09-12 ENCOUNTER — Other Ambulatory Visit (HOSPITAL_COMMUNITY): Payer: BC Managed Care – PPO

## 2023-09-17 ENCOUNTER — Other Ambulatory Visit (HOSPITAL_COMMUNITY): Payer: Self-pay | Admitting: Physician Assistant

## 2023-09-23 NOTE — Pre-Procedure Instructions (Signed)
Instructed patient on the following items: Arrival time 0800 Nothing to eat or drink after midnight No meds AM of procedure Responsible person to drive you home and stay with you for 24 hrs  Have you missed any doses of anti-coagulant Xarelto- takes once a day, hasn't missed any doses

## 2023-09-24 ENCOUNTER — Other Ambulatory Visit: Payer: Self-pay

## 2023-09-24 ENCOUNTER — Ambulatory Visit (HOSPITAL_COMMUNITY): Payer: BC Managed Care – PPO | Admitting: Anesthesiology

## 2023-09-24 ENCOUNTER — Ambulatory Visit (HOSPITAL_COMMUNITY)
Admission: RE | Disposition: A | Discharge status: Home / Self Care | Payer: Self-pay | Source: Home / Self Care | Attending: Cardiovascular Disease

## 2023-09-24 ENCOUNTER — Ambulatory Visit (HOSPITAL_COMMUNITY)
Admission: RE | Admit: 2023-09-24 | Discharge: 2023-09-24 | Disposition: A | Discharge status: Home / Self Care | Payer: BC Managed Care – PPO | Attending: Cardiovascular Disease | Admitting: Cardiovascular Disease

## 2023-09-24 ENCOUNTER — Ambulatory Visit (HOSPITAL_COMMUNITY): Payer: BC Managed Care – PPO

## 2023-09-24 DIAGNOSIS — D6869 Other thrombophilia: Secondary | ICD-10-CM | POA: Diagnosis not present

## 2023-09-24 DIAGNOSIS — Z87891 Personal history of nicotine dependence: Secondary | ICD-10-CM | POA: Diagnosis not present

## 2023-09-24 DIAGNOSIS — I4819 Other persistent atrial fibrillation: Secondary | ICD-10-CM

## 2023-09-24 DIAGNOSIS — I10 Essential (primary) hypertension: Secondary | ICD-10-CM | POA: Diagnosis not present

## 2023-09-24 DIAGNOSIS — Z79899 Other long term (current) drug therapy: Secondary | ICD-10-CM | POA: Insufficient documentation

## 2023-09-24 DIAGNOSIS — I2510 Atherosclerotic heart disease of native coronary artery without angina pectoris: Secondary | ICD-10-CM | POA: Insufficient documentation

## 2023-09-24 DIAGNOSIS — Z6836 Body mass index (BMI) 36.0-36.9, adult: Secondary | ICD-10-CM | POA: Insufficient documentation

## 2023-09-24 DIAGNOSIS — E669 Obesity, unspecified: Secondary | ICD-10-CM | POA: Insufficient documentation

## 2023-09-24 DIAGNOSIS — Z7901 Long term (current) use of anticoagulants: Secondary | ICD-10-CM | POA: Insufficient documentation

## 2023-09-24 DIAGNOSIS — G4733 Obstructive sleep apnea (adult) (pediatric): Secondary | ICD-10-CM | POA: Insufficient documentation

## 2023-09-24 HISTORY — PX: ATRIAL FIBRILLATION ABLATION: EP1191

## 2023-09-24 HISTORY — PX: TRANSESOPHAGEAL ECHOCARDIOGRAM (CATH LAB): EP1270

## 2023-09-24 LAB — GLUCOSE, CAPILLARY
Glucose-Capillary: 207 mg/dL — ABNORMAL HIGH (ref 70–99)
Glucose-Capillary: 202 mg/dL — ABNORMAL HIGH (ref 70–99)

## 2023-09-24 LAB — ECHO TEE

## 2023-09-24 SURGERY — ATRIAL FIBRILLATION ABLATION
Anesthesia: General

## 2023-09-24 MED ORDER — PHENYLEPHRINE HCL-NACL 20-0.9 MG/250ML-% IV SOLN
INTRAVENOUS | Status: DC | PRN
  Administered 2023-09-24: 30 ug/min via INTRAVENOUS

## 2023-09-24 MED ORDER — PHENYLEPHRINE 80 MCG/ML (10ML) SYRINGE FOR IV PUSH (FOR BLOOD PRESSURE SUPPORT)
PREFILLED_SYRINGE | INTRAVENOUS | Status: DC | PRN
  Administered 2023-09-24 (×3): 160 ug via INTRAVENOUS
  Administered 2023-09-24: 80 ug via INTRAVENOUS

## 2023-09-24 MED ORDER — FENTANYL CITRATE (PF) 250 MCG/5ML IJ SOLN
INTRAMUSCULAR | Status: DC | PRN
  Administered 2023-09-24 (×2): 50 ug via INTRAVENOUS

## 2023-09-24 MED ORDER — FENTANYL CITRATE (PF) 100 MCG/2ML IJ SOLN
INTRAMUSCULAR | Status: AC
  Filled 2023-09-24: qty 2

## 2023-09-24 MED ORDER — SODIUM CHLORIDE 0.9 % IV SOLN: 250.0000 mL | INTRAVENOUS | Status: DC | PRN

## 2023-09-24 MED ORDER — SUGAMMADEX SODIUM 200 MG/2ML IV SOLN
INTRAVENOUS | Status: DC | PRN
  Administered 2023-09-24: 200 mg via INTRAVENOUS
  Administered 2023-09-24: 100 mg via INTRAVENOUS

## 2023-09-24 MED ORDER — ATROPINE SULFATE 1 MG/ML IV SOLN
INTRAVENOUS | Status: DC | PRN
Start: 2023-09-24 — End: 2023-09-24
  Administered 2023-09-24: 1 mg via INTRAVENOUS

## 2023-09-24 MED ORDER — ACETAMINOPHEN 325 MG PO TABS
650.0000 mg | ORAL_TABLET | ORAL | Status: DC | PRN
  Administered 2023-09-24: 650 mg via ORAL
  Filled 2023-09-24: qty 2

## 2023-09-24 MED ORDER — PROTAMINE SULFATE 10 MG/ML IV SOLN
INTRAVENOUS | Status: DC | PRN
Start: 2023-09-24 — End: 2023-09-24
  Administered 2023-09-24: 10 mg via INTRAVENOUS
  Administered 2023-09-24 (×2): 20 mg via INTRAVENOUS

## 2023-09-24 MED ORDER — ROCURONIUM BROMIDE 10 MG/ML (PF) SYRINGE
PREFILLED_SYRINGE | INTRAVENOUS | Status: DC | PRN
  Administered 2023-09-24: 20 mg via INTRAVENOUS
  Administered 2023-09-24: 30 mg via INTRAVENOUS
  Administered 2023-09-24: 70 mg via INTRAVENOUS

## 2023-09-24 MED ORDER — PROPOFOL 10 MG/ML IV BOLUS
INTRAVENOUS | Status: DC | PRN
  Administered 2023-09-24: 200 mg via INTRAVENOUS

## 2023-09-24 MED ORDER — HEPARIN SODIUM (PORCINE) 1000 UNIT/ML IJ SOLN
INTRAMUSCULAR | Status: DC | PRN
Start: 2023-09-24 — End: 2023-09-24
  Administered 2023-09-24: 20000 [IU] via INTRAVENOUS

## 2023-09-24 MED ORDER — DEXAMETHASONE SODIUM PHOSPHATE 10 MG/ML IJ SOLN
INTRAMUSCULAR | Status: DC | PRN
  Administered 2023-09-24: 10 mg via INTRAVENOUS

## 2023-09-24 MED ORDER — SODIUM CHLORIDE 0.9% FLUSH: 3.0000 mL | INTRAVENOUS | Status: DC | PRN

## 2023-09-24 MED ORDER — LIDOCAINE 2% (20 MG/ML) 5 ML SYRINGE
INTRAMUSCULAR | Status: DC | PRN
  Administered 2023-09-24: 60 mg via INTRAVENOUS

## 2023-09-24 MED ORDER — ONDANSETRON HCL 4 MG/2ML IJ SOLN
INTRAMUSCULAR | Status: DC | PRN
  Administered 2023-09-24: 4 mg via INTRAVENOUS

## 2023-09-24 MED ORDER — SODIUM CHLORIDE 0.9% FLUSH: 3.0000 mL | Freq: Two times a day (BID) | INTRAVENOUS | Status: DC

## 2023-09-24 MED ORDER — MIDAZOLAM HCL 2 MG/2ML IJ SOLN
INTRAMUSCULAR | Status: DC | PRN
  Administered 2023-09-24: 2 mg via INTRAVENOUS

## 2023-09-24 MED ORDER — ATROPINE SULFATE 1 MG/10ML IJ SOSY
PREFILLED_SYRINGE | INTRAMUSCULAR | Status: AC
  Filled 2023-09-24: qty 10

## 2023-09-24 MED ORDER — ONDANSETRON HCL 4 MG/2ML IJ SOLN: 4.0000 mg | Freq: Four times a day (QID) | INTRAMUSCULAR | Status: DC | PRN

## 2023-09-24 MED ORDER — HEPARIN (PORCINE) IN NACL 1000-0.9 UT/500ML-% IV SOLN
INTRAVENOUS | Status: DC | PRN
  Administered 2023-09-24 (×3): 500 mL

## 2023-09-24 MED ORDER — MIDAZOLAM HCL 5 MG/5ML IJ SOLN
INTRAMUSCULAR | Status: AC
  Filled 2023-09-24: qty 5

## 2023-09-24 MED ORDER — SODIUM CHLORIDE 0.9 % IV SOLN: INTRAVENOUS | Status: DC

## 2023-09-24 SURGICAL SUPPLY — 20 items
BLANKET WARM UNDERBOD FULL ACC (MISCELLANEOUS) ×2 IMPLANT
CABLE PFA RX CATH CONN (CABLE) IMPLANT
CATH FARAWAVE ABLATION 31 (CATHETERS) IMPLANT
CATH OCTARAY 2.0 F 3-3-3-3-3 (CATHETERS) IMPLANT
CATH SOUNDSTAR ECO 8FR (CATHETERS) IMPLANT
CATH WEBSTER BI DIR CS D-F CRV (CATHETERS) IMPLANT
CLOSURE PERCLOSE PROSTYLE (VASCULAR PRODUCTS) IMPLANT
COVER SWIFTLINK CONNECTOR (BAG) ×2 IMPLANT
DEVICE CLOSURE MYNXGRIP 6/7F (Vascular Products) IMPLANT
DILATOR VESSEL 38 20CM 16FR (INTRODUCER) IMPLANT
GUIDEWIRE INQWIRE 1.5J.035X260 (WIRE) IMPLANT
INQWIRE 1.5J .035X260CM (WIRE) ×1
PACK EP LF (CUSTOM PROCEDURE TRAY) ×2 IMPLANT
PAD DEFIB RADIO PHYSIO CONN (PAD) ×2 IMPLANT
PATCH CARTO3 (PAD) IMPLANT
SHEATH FARADRIVE STEERABLE (SHEATH) IMPLANT
SHEATH PINNACLE 8F 10CM (SHEATH) IMPLANT
SHEATH PINNACLE 9F 10CM (SHEATH) IMPLANT
SHEATH PROBE COVER 6X72 (BAG) IMPLANT
SHEATH WIRE KIT BAYLIS SL1 (KITS) IMPLANT

## 2023-09-24 NOTE — Anesthesia Postprocedure Evaluation (Signed)
Anesthesia Post Note  Patient: Christopher Phillips  Procedure(s) Performed: ATRIAL FIBRILLATION ABLATION TRANSESOPHAGEAL ECHOCARDIOGRAM     Patient location during evaluation: PACU Anesthesia Type: General Level of consciousness: awake and alert Pain management: pain level controlled Vital Signs Assessment: post-procedure vital signs reviewed and stable Respiratory status: spontaneous breathing, nonlabored ventilation, respiratory function stable and patient connected to nasal cannula oxygen Cardiovascular status: blood pressure returned to baseline and stable Postop Assessment: no apparent nausea or vomiting Anesthetic complications: no  No notable events documented.  Last Vitals:  Vitals:   09/24/23 1302 09/24/23 1315  BP: 119/81 115/76  Pulse: 82 76  Resp:  (!) 9  Temp:    SpO2: 95% 95%    Last Pain:  Vitals:   09/24/23 1308  TempSrc:   PainSc: 0-No pain   Pain Goal:                   Shelton Silvas

## 2023-09-24 NOTE — Anesthesia Procedure Notes (Signed)
Procedure Name: Intubation Date/Time: 09/24/2023 10:23 AM  Performed by: April Holding, CRNAPre-anesthesia Checklist: Patient identified, Emergency Drugs available, Suction available and Patient being monitored Patient Re-evaluated:Patient Re-evaluated prior to induction Oxygen Delivery Method: Circle System Utilized Preoxygenation: Pre-oxygenation with 100% oxygen Induction Type: IV induction Ventilation: Mask ventilation without difficulty Laryngoscope Size: Miller and 2 Grade View: Grade III Tube type: Oral Tube size: 7.5 mm Number of attempts: 1 Airway Equipment and Method: Stylet and Oral airway Placement Confirmation: ETT inserted through vocal cords under direct vision, positive ETCO2 and breath sounds checked- equal and bilateral Secured at: 24 cm Tube secured with: Tape Dental Injury: Teeth and Oropharynx as per pre-operative assessment

## 2023-09-24 NOTE — CV Procedure (Signed)
Procedure: TEE  Sedation: Per anesthesiology (general)  Indication: Atrial fibrillation, pre-ablation.   Findings: Please see echo section for full report.  Normal LV size with EF 55%, no wall motion abnormalities.  Normal RV size and systolic function.  Moderate left atrial enlargement, no LA appendage thrombus.  Mild right atrial enlargement.  No PFO/ASD by color doppler.  No TR.  Trivial MR.  Trileaflet aortic valve with no stenosis or regurgitation. Normal caliber thoracic aorta.   Proceed to ablation.   Christopher Phillips 09/24/2023 10:39 AM

## 2023-09-24 NOTE — Transfer of Care (Signed)
Immediate Anesthesia Transfer of Care Note  Patient: Christopher Phillips  Procedure(s) Performed: ATRIAL FIBRILLATION ABLATION TRANSESOPHAGEAL ECHOCARDIOGRAM  Patient Location: Cath Lab  Anesthesia Type:General  Level of Consciousness: awake, alert , and oriented  Airway & Oxygen Therapy: Patient Spontanous Breathing  Post-op Assessment: Report given to RN and Post -op Vital signs reviewed and stable  Post vital signs: Reviewed and stable  Last Vitals:  Vitals Value Taken Time  BP 114/76 09/24/23 1203  Temp    Pulse 72 09/24/23 1206  Resp 16 09/24/23 1206  SpO2 93 % 09/24/23 1206  Vitals shown include unfiled device data.  Last Pain:  Vitals:   09/24/23 0901  TempSrc: Oral  PainSc: 0-No pain         Complications: No notable events documented.

## 2023-09-24 NOTE — Progress Notes (Signed)
*  PRELIMINARY RESULTS* Echocardiogram Echocardiogram Transesophageal has been performed.  Christopher Phillips 09/24/2023, 10:58 AM

## 2023-09-24 NOTE — Progress Notes (Signed)
Pt ambulated to and from bathroom to void with no signs of oozing from bilateral groin sites  

## 2023-09-24 NOTE — H&P (Signed)
Electrophysiology Office Note:    Date:  09/24/2023   ID:  Christopher Phillips, DOB 1961/02/22, MRN [id number]  PCP:  Daryll Drown, NP (Inactive)   [facility] Providers Cardiologist:  Armanda Magic, MD Electrophysiologist:  Maurice Small, MD     Referring MD: No ref. provider found   History of Present Illness:    Christopher Phillips is a 62 y.o. male with a medical history significant for persistent atrial fibrillation, OSA on CPAP, hypertension, CAD, who presents for electrophysiology follow-up.     He has seen Dr. Mayford Knife in the past for atrial fibrillation and was maintained on flecainide.  He was lost to follow-up for 2 years and represented in May 2024 persistently in atrial fibrillation.  A monitor was placed and showed 100% A-fib burden.  He was referred to atrial fibrillation clinic where flecainide was stopped, and he was admitted for Tikosyn load which took place in July 2024.     He is very symptomatic with atrial fibrillation.  Feels lightheaded, dizzy, extremely fatigued.  His rates in AF are not well-controlled, often times up to 150 bpm.  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  I reviewed the patient's CT and labs. There was no LAA thrombus. he  has not missed any doses of anticoagulation, and he took his dose last night. There have been no changes in the patient's diagnoses, medications, or condition since our recent clinic visit.   EKGs/Labs/Other Studies Reviewed Today:    Echocardiogram:  TTE 05/07/2023 Ejection fraction of 55 to 60%.  Severe left atrial dilation, right atrium is moderately dilated.  No evidence of significant valvular disease   Monitors:  Zio 03/10/2023 Heart percent atrial fibrillation, heart's ranging between 36 and 181 bpm, average 78 beats minute  Stress testing:  Reviewed in Epic  Advanced imaging:  Cardiac  catherization   EKG:         Physical Exam:    VS:  BP (!) 142/82   Pulse 81   Temp 98.2 F (36.8 C) (Oral)   Resp 18   Ht 6' 0.5" (1.842 m)   Wt 122.5 kg   SpO2 94%   BMI 36.12 kg/m     Wt Readings from Last 3 Encounters:  09/24/23 122.5 kg  06/10/23 126.4 kg  05/17/23 126 kg     GEN:  Well nourished, well developed in no acute distress CARDIAC: RRR, no murmurs, rubs, gallops RESPIRATORY:  Normal work of breathing MUSCULOSKELETAL: no edema    ASSESSMENT & PLAN:    Persistent atrial fibrillation Symptomatic Failed flecainide Began Tikosyn July 2024 He continues to have recurrences of atrial fibrillation despite the Tikosyn We discussed management options moving forward.  I recommended ablation since he has failed 2 medications now.  We discussed the indication, rationale, logistics, anticipated benefits, and potential risks of the ablation procedure including but not limited to -- bleed at the groin access site, chest pain, damage to nearby organs such as the diaphragm, lungs, or esophagus, need for a drainage tube, or prolonged hospitalization. I explained that the risk for stroke, heart attack, need for open chest surgery, or even death is very low but not zero. he  expressed understanding and wishes to proceed.   Secondary hypercoagulable state Continue Xarelto 20 mg daily for CHA2DS2-VASc score of 3  Struct of sleep apnea Compliance with CPAP encouraged Discussed the importance of sleep apnea  management to prevent recurrence of atrial fibrillation  Obesity Discussed the importance of weight loss for durable maintenance of sinus rhythm    Signed, Maurice Small, MD  09/24/2023 10:27 AM    [facility]

## 2023-09-24 NOTE — Discharge Instructions (Signed)

## 2023-09-24 NOTE — Anesthesia Preprocedure Evaluation (Addendum)
Anesthesia Evaluation  Patient identified by MRN, date of birth, ID band Patient awake    Reviewed: Allergy & Precautions, NPO status , Patient's Chart, lab work & pertinent test results  Airway Mallampati: III  TM Distance: >3 FB Neck ROM: Full    Dental  (+) Chipped,    Pulmonary sleep apnea , former smoker   breath sounds clear to auscultation       Cardiovascular hypertension, + CAD  + dysrhythmias Atrial Fibrillation  Rhythm:Irregular Rate:Normal  Echo:  1. Left ventricular ejection fraction, by estimation, is 55 to 60%. The  left ventricle has normal function. The left ventricle has no regional  wall motion abnormalities. Left ventricular diastolic function could not  be evaluated.   2. Right ventricular systolic function is normal. The right ventricular  size is normal.   3. Left atrial size was severely dilated.   4. Right atrial size was moderately dilated.   5. The mitral valve is normal in structure. Trivial mitral valve  regurgitation. No evidence of mitral stenosis.   6. The aortic valve is tricuspid. There is mild calcification of the  aortic valve. Aortic valve regurgitation is not visualized. Aortic valve  sclerosis/calcification is present, without any evidence of aortic  stenosis.   7. Aortic dilatation noted. There is borderline dilatation of the aortic  root, measuring 39 mm.   8. The inferior vena cava is dilated in size with <50% respiratory  variability, suggesting right atrial pressure of 15 mmHg.     Neuro/Psych negative neurological ROS  negative psych ROS   GI/Hepatic negative GI ROS, Neg liver ROS,,,  Endo/Other  diabetes    Renal/GU Renal disease     Musculoskeletal negative musculoskeletal ROS (+)    Abdominal   Peds  Hematology negative hematology ROS (+)   Anesthesia Other Findings   Reproductive/Obstetrics                             Anesthesia  Physical Anesthesia Plan  ASA: 3  Anesthesia Plan: General   Post-op Pain Management: Tylenol PO (pre-op)*   Induction: Intravenous  PONV Risk Score and Plan: 3 and Ondansetron, Dexamethasone and Midazolam  Airway Management Planned: Oral ETT  Additional Equipment: None  Intra-op Plan:   Post-operative Plan: Extubation in OR  Informed Consent: I have reviewed the patients History and Physical, chart, labs and discussed the procedure including the risks, benefits and alternatives for the proposed anesthesia with the patient or authorized representative who has indicated his/her understanding and acceptance.     Dental advisory given  Plan Discussed with: CRNA  Anesthesia Plan Comments:        Anesthesia Quick Evaluation

## 2023-09-25 ENCOUNTER — Encounter (HOSPITAL_COMMUNITY): Payer: Self-pay | Admitting: Cardiovascular Disease

## 2023-09-26 MED FILL — Midazolam HCl Inj 5 MG/5ML (Base Equivalent): INTRAMUSCULAR | Qty: 2 | Status: AC

## 2023-09-26 MED FILL — Fentanyl Citrate Preservative Free (PF) Inj 100 MCG/2ML: INTRAMUSCULAR | Qty: 2 | Status: AC

## 2023-10-21 DIAGNOSIS — R1111 Vomiting without nausea: Secondary | ICD-10-CM | POA: Diagnosis not present

## 2023-10-21 DIAGNOSIS — R519 Headache, unspecified: Secondary | ICD-10-CM | POA: Diagnosis not present

## 2023-10-21 DIAGNOSIS — J102 Influenza due to other identified influenza virus with gastrointestinal manifestations: Secondary | ICD-10-CM | POA: Diagnosis not present

## 2023-10-21 DIAGNOSIS — R509 Fever, unspecified: Secondary | ICD-10-CM | POA: Diagnosis not present

## 2023-10-22 ENCOUNTER — Ambulatory Visit (HOSPITAL_COMMUNITY)
Admit: 2023-10-22 | Discharge: 2023-10-22 | Disposition: A | Discharge status: Home / Self Care | Payer: BC Managed Care – PPO | Source: Ambulatory Visit | Attending: Physician Assistant | Admitting: Physician Assistant

## 2023-10-22 VITALS — BP 126/80 | HR 73 | Ht 72.5 in | Wt 271.8 lb

## 2023-10-22 DIAGNOSIS — Z5181 Encounter for therapeutic drug level monitoring: Secondary | ICD-10-CM

## 2023-10-22 DIAGNOSIS — G4733 Obstructive sleep apnea (adult) (pediatric): Secondary | ICD-10-CM | POA: Diagnosis not present

## 2023-10-22 DIAGNOSIS — I4891 Unspecified atrial fibrillation: Secondary | ICD-10-CM | POA: Diagnosis present

## 2023-10-22 DIAGNOSIS — R9431 Abnormal electrocardiogram [ECG] [EKG]: Secondary | ICD-10-CM | POA: Diagnosis not present

## 2023-10-22 DIAGNOSIS — Z79899 Other long term (current) drug therapy: Secondary | ICD-10-CM | POA: Diagnosis not present

## 2023-10-22 DIAGNOSIS — I4819 Other persistent atrial fibrillation: Secondary | ICD-10-CM | POA: Insufficient documentation

## 2023-10-22 DIAGNOSIS — Z7901 Long term (current) use of anticoagulants: Secondary | ICD-10-CM | POA: Insufficient documentation

## 2023-10-22 DIAGNOSIS — E785 Hyperlipidemia, unspecified: Secondary | ICD-10-CM | POA: Insufficient documentation

## 2023-10-22 DIAGNOSIS — I2510 Atherosclerotic heart disease of native coronary artery without angina pectoris: Secondary | ICD-10-CM | POA: Diagnosis not present

## 2023-10-22 DIAGNOSIS — D6869 Other thrombophilia: Secondary | ICD-10-CM | POA: Diagnosis not present

## 2023-10-22 DIAGNOSIS — I10 Essential (primary) hypertension: Secondary | ICD-10-CM | POA: Diagnosis present

## 2023-10-22 DIAGNOSIS — E119 Type 2 diabetes mellitus without complications: Secondary | ICD-10-CM | POA: Insufficient documentation

## 2023-10-22 LAB — BASIC METABOLIC PANEL
Anion gap: 11 (ref 5–15)
BUN: 17 mg/dL (ref 8–23)
CO2: 24 mmol/L (ref 22–32)
Calcium: 9.4 mg/dL (ref 8.9–10.3)
Chloride: 101 mmol/L (ref 98–111)
Creatinine, Ser: 0.84 mg/dL (ref 0.61–1.24)
GFR, Estimated: >60 mL/min (ref >60)
Glucose, Bld: 214 mg/dL — ABNORMAL HIGH (ref 70–99)
Potassium: 4 mmol/L (ref 3.5–5.1)
Sodium: 136 mmol/L (ref 135–145)

## 2023-10-22 LAB — MAGNESIUM: Magnesium: 2.2 mg/dL (ref 1.7–2.4)

## 2023-10-22 NOTE — Patient Instructions (Addendum)
 Antibiotics- Can Take Augmentin, penicillin, amoxicillin, doxycycline   Do not take Benadryl, Cipro, levoquin, Zithromax, Tamiflu

## 2023-10-22 NOTE — Progress Notes (Signed)
 Primary Care Physician: Cherylene Homer HERO, NP (Inactive) Primary Cardiologist: Dr. Shlomo Primary Electrophysiologist: Dr. Nancey Referring Physician: Dr. Shlomo Christopher Phillips is a 62 y.o. male with a history of HTN, HLD, OSA on CPAP, T2DM, CAD, and persistent atrial fibrillation who presents for follow up in the [facility]. Cardiac monitor placed by Dr. Shlomo 5/1-12/24 showed 100% Afib burden. He has been on flecainide  since 2016. Patient is on Xarelto  20 mg daily for a CHADS2VASC score of 3.  Patient is s/p dofetilide  loading 7/15-7/18/24 with DCCV on 05/08/23. He continued to have symptomatic afib and is s/p afib ablation with Dr Nancey on 09/24/23.  On follow up today, patient reports that he has done very well since his ablation and feels like a new person. He has not had any interim afib. He was diagnosed with influenza A last week but has recovered well. He denies chest pain or groin issues.  Today, he denies symptoms of palpitations, chest pain, shortness of breath, orthopnea, PND, lower extremity edema, dizziness, presyncope, syncope, snoring, daytime somnolence, bleeding, or neurologic sequela. The patient is tolerating medications without difficulties and is otherwise without complaint today.   Atrial Fibrillation Risk Factors:  he does have symptoms or diagnosis of sleep apnea. he is compliant with CPAP therapy. he does not have a history of rheumatic fever. he does not have a history of alcohol use. The patient does not have a history of early familial atrial fibrillation or other arrhythmias.   Atrial Fibrillation Management history:  Previous antiarrhythmic drugs: Flecainide , dofetilide   Previous cardioversions: 05/08/23 Previous ablations: 09/24/23 Anticoagulation history: Xarelto  20 mg daily   Past Medical History:  Diagnosis Date   CAD (coronary artery disease)    a. nonobstructive by cath 01/2015.   Complication of anesthesia     slow to wake up   Diabetes mellitus (HCC)    a. A1c 6.5 in 01/2015.   History of kidney stones    Hyperlipidemia    Hypertension    Morbid obesity (HCC)    OSA on CPAP    severe with AHI 24.84/hr now on CPAP at 13cm H2O   Persistent atrial fibrillation (HCC)    a. Started on flecainide  - developed AF RVR with GXT. Increased to 100mg  BID, started on Xarelto . CHADSVASC = 3  (HTN , DM and nonobstructive CAD)    ROS- All systems are reviewed and negative except as per the HPI above.  Physical Exam: Vitals:   10/22/23 1048  BP: 126/80  Pulse: 73  Weight: 123.3 kg  Height: 6' 0.5 (1.842 m)    GEN: Well nourished, well developed in no acute distress NECK: No JVD; No carotid bruits CARDIAC: Regular rate and rhythm, no murmurs, rubs, gallops RESPIRATORY:  Clear to auscultation without rales, wheezing or rhonchi  ABDOMEN: Soft, non-tender, non-distended EXTREMITIES:  No edema; No deformity    Wt Readings from Last 3 Encounters:  10/22/23 123.3 kg  09/24/23 122.5 kg  06/10/23 126.4 kg    EKG today demonstrates  SR, 1st degree AV block, PACs Vent. rate 73 BPM PR interval 210 ms QRS duration 92 ms QT/QTcB 406/447 ms   Echo 01/31/2015 demonstrated: Study Conclusions  - Left ventricle: The cavity size was normal. Wall thickness was    normal. Systolic function was normal. The estimated ejection    fraction was in the range of 55% to 60%. Wall motion was normal;    there were no regional wall motion abnormalities.  -  Left atrium: The atrium was mildly to moderately dilated.  - Right ventricle: The cavity size was mildly dilated.  - Right atrium: The atrium was mildly dilated.   Impressions:  - Technically difficult; definity  used; normal LV function;    biatrial enlargement; mild RVE; trace TR.    Epic records are reviewed at length today.  CHA2DS2-VASc Score = 3  The patient's score is based upon: CHF History: 0 HTN History: 1 Diabetes History: 1 Stroke History:  0 Vascular Disease History: 1 Age Score: 0 Gender Score: 0     ASSESSMENT AND PLAN: Persistent Atrial Fibrillation (ICD10:  I48.19) The patient's CHA2DS2-VASc score is 3, indicating a 3.2% annual risk of stroke.   S/p dofetilide  loading 7/15-7/18/24  S/p afib ablation 09/24/23 Patient appears to be maintaining SR Continue dofetilide  500 mcg BID, QT stable Check bmet/mag today Continue Toprol  50 mg daily Continue Xarelto  20 mg daily  Secondary Hypercoagulable State (ICD10:  D68.69) The patient is at significant risk for stroke/thromboembolism based upon his CHA2DS2-VASc Score of 3.  Continue Rivaroxaban  (Xarelto ).   OSA  Encouraged nightly CPAP  HTN Stable on current regimen   Follow up with Dr Nancey as scheduled.    Christopher Kicks PA-C Afib Clinic [facility] [address] [place], [place] [address] [phone] 10/22/2023 11:24 AM

## 2023-11-29 ENCOUNTER — Telehealth: Payer: Self-pay | Admitting: Cardiology

## 2023-11-29 MED ORDER — POTASSIUM CHLORIDE CRYS ER 20 MEQ PO TBCR: 20.0000 meq | EXTENDED_RELEASE_TABLET | Freq: Every day | ORAL | 0 refills | Status: DC

## 2023-11-29 MED ORDER — LISINOPRIL 20 MG PO TABS: 20.0000 mg | ORAL_TABLET | Freq: Every day | ORAL | 0 refills | Status: DC

## 2023-11-29 MED ORDER — METOPROLOL SUCCINATE ER 50 MG PO TB24: ORAL_TABLET | ORAL | 0 refills | Status: DC

## 2023-11-29 NOTE — Telephone Encounter (Signed)
 Pt's medications were sent to pt's pharmacy as requested. Confirmation received.

## 2023-11-29 NOTE — Telephone Encounter (Signed)
*  STAT* If patient is at the pharmacy, call can be transferred to refill team.   1. Which medications need to be refilled? (please list name of each medication and dose if known) lisinopril  (ZESTRIL ) 20 MG tablet   metoprolol  succinate (TOPROL -XL) 50 MG 24 hr tablet   potassium chloride  SA (KLOR-CON  M) 20 MEQ tablet   2. Which pharmacy/location (including street and city if local pharmacy) is medication to be sent to? [facility] - [place], [place] - [address]   3. Do they need a 30 day or 90 day supply? 90

## 2023-12-23 ENCOUNTER — Ambulatory Visit: Payer: Self-pay | Admitting: Cardiovascular Disease

## 2024-01-01 ENCOUNTER — Other Ambulatory Visit: Payer: Self-pay | Admitting: Cardiology

## 2024-01-14 ENCOUNTER — Encounter: Payer: Self-pay | Admitting: Cardiovascular Disease

## 2024-01-14 ENCOUNTER — Ambulatory Visit: Payer: Self-pay | Attending: Cardiovascular Disease | Admitting: Cardiovascular Disease

## 2024-01-14 VITALS — BP 118/82 | HR 69 | Ht 72.05 in | Wt 258.0 lb

## 2024-01-14 DIAGNOSIS — I4819 Other persistent atrial fibrillation: Secondary | ICD-10-CM

## 2024-01-14 NOTE — Patient Instructions (Signed)
 Medication Instructions:  STOP Tikosyn *If you need a refill on your cardiac medications before your next appointment, please call your pharmacy*   Follow-Up: At [facility], you and your health needs are our priority.  As part of our continuing mission to provide you with exceptional heart care, we have created designated Provider Care Teams.  These Care Teams include your primary Cardiologist (physician) and Advanced Practice Providers (APPs -  Physician Assistants and Nurse Practitioners) who all work together to provide you with the care you need, when you need it.  We recommend signing up for the patient portal called "MyChart".  Sign up information is provided on this After Visit Summary.  MyChart is used to connect with patients for Virtual Visits (Telemedicine).  Patients are able to view lab/test results, encounter notes, upcoming appointments, etc.  Non-urgent messages can be sent to your provider as well.   To learn more about what you can do with MyChart, go to [email or web address].    Your next appointment:   6 month(s)  Provider:   You will follow up in the Atrial Fibrillation Clinic located at [facility]. Your provider will be: Clint R. Fenton, PA-C or Lake Bells, PA-C

## 2024-01-14 NOTE — Progress Notes (Signed)
  Electrophysiology Office Note:    Date:  01/14/2024   ID:  Christopher Phillips, DOB 25-Jan-1961, MRN [id number]  PCP:  Daryll Drown, NP   [facility] Providers Cardiologist:  Armanda Magic, MD Electrophysiologist:  Maurice Small, MD     Referring MD: Daryll Drown, NP   History of Present Illness:    Christopher Phillips is a 63 y.o. male with a medical history significant for persistent atrial fibrillation, OSA on CPAP, hypertension, CAD, who presents for electrophysiology follow-up.     He has seen Dr. Mayford Knife in the past for atrial fibrillation and was maintained on flecainide.  He was lost to follow-up for 2 years and represented in May 2024 persistently in atrial fibrillation.  A monitor was placed and showed 100% A-fib burden.  He was referred to atrial fibrillation clinic where flecainide was stopped, and he was admitted for Tikosyn load which took place in July 2024.     He is very symptomatic with atrial fibrillation.  Feels lightheaded, dizzy, extremely fatigued.  His rates in AF are not well-controlled, often times up to 150 bpm.  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  He underwent pulsed field ablation for atrial fibrillation in December 2024.  EKGs/Labs/Other Studies Reviewed Today:    Echocardiogram:  TTE 05/07/2023 Ejection fraction of 55 to 60%.  Severe left atrial dilation, right atrium is moderately dilated.  No evidence of significant valvular disease   Monitors:  Zio 03/10/2023 Heart percent atrial fibrillation, heart's ranging between 36 and 181 bpm, average 78 beats minute  Stress testing:  Reviewed in Epic  Advanced imaging:  Cardiac catherization   EKG:   EKG Interpretation Date/Time:  Tuesday January 14 2024 12:28:18 EDT Ventricular Rate:  69 PR Interval:  212 QRS Duration:  92 QT Interval:  404 QTC  Calculation: 432 R Axis:   67  Text Interpretation: Sinus rhythm with sinus arrhythmia with 1st degree A-V block Septal infarct (cited on or before 08-May-2023) When compared with ECG of 22-Oct-2023 11:14, No significant change was found Confirmed by [name] [phone]) on 01/14/2024 12:36:24 PM     Physical Exam:    VS:  BP 118/82 (BP Location: Left Arm, Patient Position: Sitting, Cuff Size: Large)   Pulse 69   Ht 6' 0.05" (1.83 m)   Wt 258 lb (117 kg)   SpO2 97%   BMI 34.94 kg/m     Wt Readings from Last 3 Encounters:  01/14/24 258 lb (117 kg)  10/22/23 271 lb 12.8 oz (123.3 kg)  09/24/23 270 lb (122.5 kg)     GEN:  Well nourished, well developed in no acute distress CARDIAC: RRR, no murmurs, rubs, gallops RESPIRATORY:  Normal work of breathing MUSCULOSKELETAL: no edema    ASSESSMENT & PLAN:    Persistent atrial fibrillation Symptomatic Failed flecainide Began Tikosyn July 2024 S/p ablation, maintaining sinus Will DC Tikosyn today and monitor  Secondary hypercoagulable state Continue Xarelto 20 mg daily for CHA2DS2-VASc score of 3  Struct of sleep apnea Compliance with CPAP encouraged Discussed the importance of sleep apnea management to prevent recurrence of atrial fibrillation  Obesity Discussed the importance of weight loss for durable maintenance of sinus rhythm    Signed, Maurice Small, MD  01/14/2024 12:36 PM    [facility]

## 2024-02-04 ENCOUNTER — Other Ambulatory Visit: Payer: Self-pay | Admitting: Cardiology

## 2024-02-10 ENCOUNTER — Other Ambulatory Visit (HOSPITAL_COMMUNITY): Payer: Self-pay | Admitting: Physician Assistant

## 2024-02-12 ENCOUNTER — Other Ambulatory Visit: Payer: Self-pay | Admitting: Cardiology

## 2024-02-17 ENCOUNTER — Ambulatory Visit: Admitting: Family Medicine

## 2024-02-17 ENCOUNTER — Encounter: Payer: Self-pay | Admitting: Family Medicine

## 2024-02-17 VITALS — BP 122/73 | HR 75 | Temp 97.5°F | Ht 73.0 in | Wt 251.0 lb

## 2024-02-17 DIAGNOSIS — S61011A Laceration without foreign body of right thumb without damage to nail, initial encounter: Secondary | ICD-10-CM | POA: Diagnosis not present

## 2024-02-17 DIAGNOSIS — Z23 Encounter for immunization: Secondary | ICD-10-CM | POA: Diagnosis not present

## 2024-02-17 NOTE — Progress Notes (Signed)
 BP 122/73   Pulse 75   Temp (!) 97.5 F (36.4 C) (Oral)   Ht 6\' 1"  (1.854 m)   Wt 251 lb (113.9 kg)   SpO2 98%   BMI 33.12 kg/m    Subjective:   Patient ID: Jaydiel Holthaus, male    DOB: September 27, 1961, 63 y.o.   MRN: [id number]  HPI: Keonne Copping is a 63 y.o. male presenting on 02/17/2024 for Laceration (Right thumb - about 30 mins ago )   HPI Patient was woodworking with a band saw and nicked the end of his thumb on the band saw on his coming in today for this laceration.  He did about 20 minutes prior to coming over.  He was able to compress it and mostly stop the bleeding.  He is able to move it completely and sensation feels to be intact on the end of his right thumb.  Relevant past medical, surgical, family and social history reviewed and updated as indicated. Interim medical history since our last visit reviewed. Allergies and medications reviewed and updated.  Review of Systems  Skin:  Positive for wound. Negative for color change and rash.    Per HPI unless specifically indicated above   Allergies as of 02/17/2024       Reactions   Iodinated Contrast Media Itching   Pt can NOT take Benadryl while on Tikosyn    Other Other (See Comments)   Pt can NOT take Benadryl while on Tikosyn         Medication List        Accurate as of February 17, 2024 11:59 PM. If you have any questions, ask your nurse or doctor.          STOP taking these medications    penicillin v potassium 500 MG tablet Commonly known as: VEETID Stopped by: Lucio Sabin Ahlayah Tarkowski       TAKE these medications    allopurinol  300 MG tablet Commonly known as: ZYLOPRIM  Take 300 mg by mouth daily.   atorvastatin  80 MG tablet Commonly known as: LIPITOR  TAKE 1 TABLET BY MOUTH IN THE  EVENING   BIOTIN PO Take 1 capsule by mouth daily.   Coenzyme Q10 400 MG Caps Taking 1 capsule by mouth daily   diltiazem  30 MG tablet Commonly known as: Cardizem  Take 1 tablet by mouth every 4 hours as needed  for HR greater than 100 and top number B/P needs to be above 100   fluticasone  50 MCG/ACT nasal spray Commonly known as: FLONASE  Place 2 sprays into both nostrils daily. What changed:  when to take this reasons to take this   lisinopril  20 MG tablet Commonly known as: ZESTRIL  TAKE 1 TABLET BY MOUTH DAILY   Magnesium  400 MG Tabs Take 1 tablet by mouth every morning.   metoprolol  succinate 50 MG 24 hr tablet Commonly known as: TOPROL -XL TAKE 1 TABLET BY MOUTH DAILY  WITH OR IMMEDIATELY FOLLOWING A  MEAL   potassium chloride  SA 20 MEQ tablet Commonly known as: KLOR-CON  M TAKE 1 TABLET BY MOUTH DAILY   rivaroxaban  20 MG Tabs tablet Commonly known as: Xarelto  Take 1 tablet (20 mg total) by mouth daily with supper. What changed: when to take this   Tylenol  8 Hour 650 MG CR tablet Generic drug: acetaminophen  650-1,300 mg as needed for pain. Arthritis Tylenol    vitamin C 1000 MG tablet Take 1,000 mg by mouth daily.         Objective:   BP 122/73  Pulse 75   Temp (!) 97.5 F (36.4 C) (Oral)   Ht 6\' 1"  (1.854 m)   Wt 251 lb (113.9 kg)   SpO2 98%   BMI 33.12 kg/m   Wt Readings from Last 3 Encounters:  02/17/24 251 lb (113.9 kg)  01/14/24 258 lb (117 kg)  10/22/23 271 lb 12.8 oz (123.3 kg)    Physical Exam Vitals and nursing note reviewed.  Constitutional:      Appearance: Normal appearance.  Skin:    Findings: Laceration (1.5 cm laceration on the end of his right thumb) present. No rash.  Neurological:     Mental Status: He is alert.     Laceration repair: Wound was irrigated with normal saline at pressure. 2% lidocaine  with epinephrine  was used for local anesthesia, 2mL. 3-0 Monocryl was used to repair the wound.  Placed 4 sutures. Wound was approximated well and topical antibiotic was used and then it was covered by 4 x 4 and tape told in place. Procedure was tolerated well   Assessment & Plan:   Problem List Items Addressed This Visit   None Visit  Diagnoses       Laceration of right thumb without foreign body without damage to nail, initial encounter    -  Primary     Encounter for immunization       Relevant Orders   Tdap vaccine greater than or equal to 7yo IM (Completed)        Follow up plan: Return if symptoms worsen or fail to improve, for 10 days suture removal.  Counseling provided for all of the vaccine components Orders Placed This Encounter  Procedures   Tdap vaccine greater than or equal to 7yo IM    [name], MD [facility] 02/19/2024, 10:15 AM

## 2024-02-24 ENCOUNTER — Ambulatory Visit: Payer: Self-pay | Admitting: Cardiology

## 2024-02-27 ENCOUNTER — Encounter: Payer: Self-pay | Admitting: Family Medicine

## 2024-02-27 ENCOUNTER — Ambulatory Visit: Admitting: Family Medicine

## 2024-02-27 VITALS — BP 133/81 | HR 87 | Ht 73.0 in | Wt 262.0 lb

## 2024-02-27 DIAGNOSIS — S61011D Laceration without foreign body of right thumb without damage to nail, subsequent encounter: Secondary | ICD-10-CM

## 2024-02-27 DIAGNOSIS — S61011A Laceration without foreign body of right thumb without damage to nail, initial encounter: Secondary | ICD-10-CM

## 2024-02-27 DIAGNOSIS — L03113 Cellulitis of right upper limb: Secondary | ICD-10-CM | POA: Diagnosis not present

## 2024-02-27 MED ORDER — SULFAMETHOXAZOLE-TRIMETHOPRIM 800-160 MG PO TABS: 1.0000 | ORAL_TABLET | Freq: Two times a day (BID) | ORAL | 0 refills | Status: AC

## 2024-02-27 NOTE — Progress Notes (Signed)
 BP 133/81   Pulse 87   Ht 6\' 1"  (1.854 m)   Wt 262 lb (118.8 kg)   SpO2 98%   BMI 34.57 kg/m    Subjective:   Patient ID: Christopher Phillips, male    DOB: July 13, 1961, 63 y.o.   MRN: [id number]  HPI: Christopher Phillips is a 63 y.o. male presenting on 02/27/2024 for 10 day Post op (Right thumb suture removal)   HPI Patient is coming in for recheck and possible suture removal on his right thumb.  He says he had some purulent drainage out of it and he is using some honey and proximal by that has been helping and is looking better but it still has some purulent drainage out of it.  He denies any fevers or chills or redness or warmth anywhere else.  Relevant past medical, surgical, family and social history reviewed and updated as indicated. Interim medical history since our last visit reviewed. Allergies and medications reviewed and updated.  Review of Systems  Constitutional:  Negative for chills and fever.  Respiratory:  Negative for shortness of breath and wheezing.   Cardiovascular:  Negative for chest pain.  Skin:  Positive for color change and wound. Negative for rash.  All other systems reviewed and are negative.   Per HPI unless specifically indicated above   Allergies as of 02/27/2024       Reactions   Iodinated Contrast Media Itching   Pt can NOT take Benadryl while on Tikosyn    Other Other (See Comments)   Pt can NOT take Benadryl while on Tikosyn         Medication List        Accurate as of Feb 27, 2024  9:07 AM. If you have any questions, ask your nurse or doctor.          allopurinol  300 MG tablet Commonly known as: ZYLOPRIM  Take 300 mg by mouth daily.   atorvastatin  80 MG tablet Commonly known as: LIPITOR  TAKE 1 TABLET BY MOUTH IN THE  EVENING   BIOTIN PO Take 1 capsule by mouth daily.   Coenzyme Q10 400 MG Caps Taking 1 capsule by mouth daily   diltiazem  30 MG tablet Commonly known as: Cardizem  Take 1 tablet by mouth every 4 hours as needed for HR  greater than 100 and top number B/P needs to be above 100   fluticasone  50 MCG/ACT nasal spray Commonly known as: FLONASE  Place 2 sprays into both nostrils daily. What changed:  when to take this reasons to take this   lisinopril  20 MG tablet Commonly known as: ZESTRIL  TAKE 1 TABLET BY MOUTH DAILY   Magnesium  400 MG Tabs Take 1 tablet by mouth every morning.   metoprolol  succinate 50 MG 24 hr tablet Commonly known as: TOPROL -XL TAKE 1 TABLET BY MOUTH DAILY  WITH OR IMMEDIATELY FOLLOWING A  MEAL   potassium chloride  SA 20 MEQ tablet Commonly known as: KLOR-CON  M TAKE 1 TABLET BY MOUTH DAILY   rivaroxaban  20 MG Tabs tablet Commonly known as: Xarelto  Take 1 tablet (20 mg total) by mouth daily with supper. What changed: when to take this   sulfamethoxazole-trimethoprim 800-160 MG tablet Commonly known as: BACTRIM DS Take 1 tablet by mouth 2 (two) times daily.   Tylenol  8 Hour 650 MG CR tablet Generic drug: acetaminophen  650-1,300 mg as needed for pain. Arthritis Tylenol    vitamin C 1000 MG tablet Take 1,000 mg by mouth daily.  Objective:   BP 133/81   Pulse 87   Ht 6\' 1"  (1.854 m)   Wt 262 lb (118.8 kg)   SpO2 98%   BMI 34.57 kg/m   Wt Readings from Last 3 Encounters:  02/27/24 262 lb (118.8 kg)  02/17/24 251 lb (113.9 kg)  01/14/24 258 lb (117 kg)    Physical Exam Vitals and nursing note reviewed.  Constitutional:      Appearance: Normal appearance.  Skin:    General: Skin is warm and dry.     Findings: Laceration (Laceration on the tip of his finger appears to be healing but does have an area that has some purulence out of it and able to express a small purulent drainage.) present.  Neurological:     Mental Status: He is alert.     Slight cellulitis and purulence coming out of the incision  Assessment & Plan:   Problem List Items Addressed This Visit   None Visit Diagnoses       Laceration of right thumb without foreign body without  damage to nail, initial encounter    -  Primary   Relevant Medications   sulfamethoxazole-trimethoprim (BACTRIM DS) 800-160 MG tablet     Cellulitis of right upper extremity       Relevant Medications   sulfamethoxazole-trimethoprim (BACTRIM DS) 800-160 MG tablet       Will give antibiotic and have him come back in 1 week for removal. Follow up plan: Return if symptoms worsen or fail to improve.  Counseling provided for all of the vaccine components No orders of the defined types were placed in this encounter.   [name], MD [facility] 02/27/2024, 9:07 AM

## 2024-03-02 ENCOUNTER — Telehealth: Payer: Self-pay | Admitting: Family Medicine

## 2024-03-02 NOTE — Telephone Encounter (Unsigned)
 Copied from CRM [id number]. Topic: Appointments - Appointment Cancel/Reschedule >> Mar 02, 2024 10:47 AM Jorie Newness J wrote: Patient/patient representative is calling to cancel or reschedule an appointment. Refer to attachments for appointment information.  Pt states he cut his thumb a few weeks ago and was supposed to have his stitches removed last week but he had an infection so his appointment was rescheduled and he was given antibiotics. He took them all and his stitches fell out on their own and he is cancelling his appointment. If there are any questions, please f/u Callback # [phone]

## 2024-03-05 ENCOUNTER — Ambulatory Visit: Admitting: Family Medicine

## 2024-03-23 ENCOUNTER — Other Ambulatory Visit: Payer: Self-pay | Admitting: Cardiology

## 2024-03-26 ENCOUNTER — Encounter: Payer: Self-pay | Admitting: Cardiology

## 2024-03-26 ENCOUNTER — Ambulatory Visit: Payer: Self-pay | Attending: Cardiology | Admitting: Cardiology

## 2024-03-26 VITALS — BP 138/70 | HR 82 | Ht 73.0 in | Wt 257.0 lb

## 2024-03-26 DIAGNOSIS — G4733 Obstructive sleep apnea (adult) (pediatric): Secondary | ICD-10-CM | POA: Diagnosis not present

## 2024-03-26 DIAGNOSIS — Z79899 Other long term (current) drug therapy: Secondary | ICD-10-CM

## 2024-03-26 DIAGNOSIS — I2510 Atherosclerotic heart disease of native coronary artery without angina pectoris: Secondary | ICD-10-CM

## 2024-03-26 DIAGNOSIS — E7800 Pure hypercholesterolemia, unspecified: Secondary | ICD-10-CM

## 2024-03-26 DIAGNOSIS — I10 Essential (primary) hypertension: Secondary | ICD-10-CM | POA: Diagnosis not present

## 2024-03-26 DIAGNOSIS — I4819 Other persistent atrial fibrillation: Secondary | ICD-10-CM

## 2024-03-26 NOTE — Patient Instructions (Signed)
 Medication Instructions:  Your physician recommends that you continue on your current medications as directed. Please refer to the Current Medication list given to you today.  *If you need a refill on your cardiac medications before your next appointment, please call your pharmacy*  Lab Work: Please complete a FASTING lipid panel, a CMET and a CBC in our lab on the first floor before you leave.  If you have labs (blood work) drawn today and your tests are completely normal, you will receive your results only by: MyChart Message (if you have MyChart) OR A paper copy in the mail If you have any lab test that is abnormal or we need to change your treatment, we will call you to review the results.  Testing/Procedures: None.  Follow-Up: At [facility], you and your health needs are our priority.  As part of our continuing mission to provide you with exceptional heart care, our providers are all part of one team.  This team includes your primary Cardiologist (physician) and Advanced Practice Providers or APPs (Physician Assistants and Nurse Practitioners) who all work together to provide you with the care you need, when you need it.  Your next appointment:   1 year(s)  Provider:   Gaylyn Keas, MD

## 2024-03-26 NOTE — Progress Notes (Signed)
 Date:  03/26/2024   ID:  Christopher Phillips, DOB Jul 16, 1961, MRN [id number]  Patient Location:  Home  Provider location:   [place]  PCP:  Christopher Roes, NP  Cardiologist:  Christopher Keas, MD Electrophysiologist:  Christopher Grange, MD   Chief Complaint:  Atrial fibrillation, CAD, OSA, HTN  History of Present Illness:    Christopher Phillips is a 63 y.o. 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    He is here today for followup and is doing well.  Since I saw him last he has lost 40lbs and feels the best he has felt in years. He denies any chest pain or pressure, SOB, DOE, PND, orthopnea, LE edema, dizziness, palpitations or syncope. He is compliant with his meds and is tolerating meds with no SE.    He is doing well with his PAP device and thinks that he has gotten used to it.  He tolerates the nasal mask and feels the pressure is adequate.  Since going on PAP he feels rested in the am and has no significant daytime sleepiness.  He denies any significant mouth or nasal dryness or nasal congestion.  He has some throat dryness.  He does not think that he snores.      Prior CV studies:   The following studies were reviewed today:  PAP compliance download, and labs  Past Medical History:  Diagnosis Date   CAD (coronary artery disease)    a. nonobstructive by cath 01/2015.   Complication of anesthesia    slow to wake up   Diabetes mellitus (HCC)    a. A1c 6.5 in 01/2015.   History of kidney stones    Hyperlipidemia    Hypertension    Morbid obesity (HCC)    OSA on CPAP    severe with AHI 24.84/hr now on CPAP at 13cm H2O   Persistent atrial fibrillation (HCC)    a. Started on flecainide  - developed AF RVR with GXT. Increased to 100mg  BID, started on Xarelto . CHADSVASC = 3  (HTN , DM and nonobstructive CAD)   Past Surgical History:  Procedure Laterality Date    ATRIAL FIBRILLATION ABLATION N/A 09/24/2023   Procedure: ATRIAL FIBRILLATION ABLATION;  Surgeon: Christopher Grange, MD;  Location: [facility];  Service: Cardiovascular;  Laterality: N/A;   CARDIOVERSION N/A 05/08/2023   Procedure: CARDIOVERSION;  Surgeon: Christopher Rule, MD;  Location: [facility];  Service: Cardiovascular;  Laterality: N/A;   CYSTOSCOPY WITH URETEROSCOPY Right 02/08/2014   Procedure: Cystoscopy Left retrograde pyleogram, right antegrade pyleogram;  Surgeon: Christopher Dupes, MD;  Location: [facility];  Service: Urology;  Laterality: Right;   KIDNEY STONE SURGERY     20 YRS AGO   KNEE SURGERY     BILATERAL   LEFT HEART CATHETERIZATION WITH CORONARY ANGIOGRAM N/A 02/17/2015   Procedure: LEFT HEART CATHETERIZATION WITH CORONARY ANGIOGRAM;  Surgeon: Christopher Benne, MD;  Location: [facility] C[facility];  Service: Cardiovascular;  Laterality: N/A;   NEPHROLITHOTOMY Right 02/08/2014   Procedure: NEPHROLITHOTOMY PERCUTANEOUS;  Surgeon: Christopher Dupes, MD;  Location: [facility];  Service: Urology;  Laterality: Right;  1ST STAGE (RT) PCNL       NEPHROLITHOTOMY Right 02/10/2014   Procedure: RIGHT NEPHROLITHOTOMY PERCUTANEOUS SECOND LOOK;  Surgeon: Christopher Dupes, MD;  Location: [facility];  Service: Urology;  Laterality: Right;  2ND STAGE (RT) PCNL    TRANSESOPHAGEAL ECHOCARDIOGRAM (CATH LAB) N/A 09/24/2023   Procedure: TRANSESOPHAGEAL ECHOCARDIOGRAM;  Surgeon: Christopher Grange, MD;  Location: [facility];  Service: Cardiovascular;  Laterality: N/A;     Current Meds  Medication Sig   acetaminophen  (TYLENOL  8 HOUR) 650 MG CR tablet 650-1,300 mg as needed for pain. Arthritis Tylenol    allopurinol  (ZYLOPRIM ) 300 MG tablet Take 300 mg by mouth daily.   Ascorbic Acid (VITAMIN C) 1000 MG tablet Take 1,000 mg by mouth daily.   atorvastatin  (LIPITOR ) 80 MG tablet TAKE 1 TABLET BY MOUTH IN THE  EVENING   BIOTIN PO Take 1 capsule by mouth daily.   Coenzyme Q10 400  MG CAPS Taking 1 capsule by mouth daily   fluticasone  (FLONASE ) 50 MCG/ACT nasal spray Place 2 sprays into both nostrils daily. (Patient taking differently: Place 2 sprays into both nostrils daily as needed (allergies/nasal congestion.).)   lisinopril  (ZESTRIL ) 20 MG tablet TAKE 1 TABLET BY MOUTH DAILY   Magnesium  400 MG TABS Take 1 tablet by mouth every morning.   metoprolol  succinate (TOPROL -XL) 50 MG 24 hr tablet TAKE 1 TABLET BY MOUTH DAILY  WITH OR IMMEDIATELY FOLLOWING A  MEAL   potassium chloride  SA (KLOR-CON  M) 20 MEQ tablet TAKE 1 TABLET BY MOUTH DAILY   rivaroxaban  (XARELTO ) 20 MG TABS tablet Take 1 tablet (20 mg total) by mouth daily with supper. (Patient taking differently: Take 20 mg by mouth daily in the afternoon.)     Allergies:   Iodinated contrast media and Other   Social History   Tobacco Use   Smoking status: Former    Current packs/day: 0.00    Types: Cigarettes    Quit date: 01/29/1993    Years since quitting: 31.1   Smokeless tobacco: Never  Vaping Use   Vaping status: Never Used  Substance Use Topics   Alcohol use: No    Alcohol/week: 0.0 standard drinks of alcohol   Drug use: No     Family Hx: The patient's family history includes COPD in his father; Diabetes in his brother and mother; Heart disease in his father and mother; Heart failure (age of onset: 36) in his father and mother.  ROS:   Please see the history of present illness.     All other systems reviewed and are negative.   Labs/Other Tests and Data Reviewed:    Recent Labs: 08/27/2023: Hemoglobin 14.6; Platelets 162 10/22/2023: BUN 17; Creatinine, Ser 0.84; Magnesium  2.2; Potassium 4.0; Sodium 136   Recent Lipid Panel Lab Results  Component Value Date/Time   CHOL 67 (L) 02/13/2018 05:00 PM   TRIG 85 02/13/2018 05:00 PM   HDL 23 (L) 02/13/2018 05:00 PM   CHOLHDL 2.9 02/13/2018 05:00 PM   CHOLHDL 7.0 02/17/2015 05:00 AM   LDLCALC 27 02/13/2018 05:00 PM    Wt Readings from Last 3  Encounters:  03/26/24 257 lb (116.6 kg)  02/27/24 262 lb (118.8 kg)  02/17/24 251 lb (113.9 kg)     Objective:    Vital Signs:  BP 138/70 (BP Location: Right Arm, Patient Position: Sitting, Cuff Size: Large)   Pulse 82   Ht 6\' 1"  (1.854 m)   Wt 257 lb (116.6 kg)   SpO2 96%   BMI 33.91 kg/m    GEN: Well nourished, well developed in no acute distress HEENT: Normal NECK: No JVD; No carotid bruits LYMPHATICS: No lymphadenopathy CARDIAC:RRR, no murmurs, rubs, gallops RESPIRATORY:  Clear to auscultation without  rales, wheezing or rhonchi  ABDOMEN: Soft, non-tender, non-distended MUSCULOSKELETAL:  No edema; No deformity  SKIN: Warm and dry NEUROLOGIC:  Alert and oriented x 3 PSYCHIATRIC:  Normal affect   ASSESSMENT/PLAN:  1.  OSA - The patient is tolerating PAP therapy well without any problems. TThe patient has been using and benefiting from PAP use and will continue to benefit from therapy.  - I will get a download on his device -I encouraged him to adjust the humidity in his device to help with throat dryness   2.  ASCAD  -cath 2016 showed nonobstructive ASCAD.   -He denies any anginal symptoms since I saw him last -Continue atorvastatin  80 mg daily and Toprol  XL 50 mg daily with as needed refills -no ASA due to DOAC  3.  Hypertension  -BP is controlled on exam today -Continue lisinopril  20 mg daily and Toprol -XL 50 mg daily with as needed refills  4.  Hyperlipidemia -LDL goal < 70 -repeat FLP and ALT -Continue atorvastatin  80 mg daily with as needed refills year hide  5.  Persistent atrial fibrillation  -followed by EP -Failed flecainide  and started Tikosyn  July 2024 which was stopped post ablation -Status post A-fib ablation 10/11/2023 -He is maintaining normal sinus rhythm today denies any palpitations -Continue Toprol  XL 50 mg daily and Xarelto  20 mg daily with as needed refills -Denies any bleeding problems on DOAC -check CBC and BMET  Medication  Adjustments/Labs and Tests Ordered: Current medicines are reviewed at length with the patient today.  Concerns regarding medicines are outlined above.  Tests Ordered: No orders of the defined types were placed in this encounter.   Medication Changes: No orders of the defined types were placed in this encounter.    Disposition:  Follow up in 1 year(s)  Signed, Christopher Keas, MD  03/26/2024 9:49 AM    [facility]

## 2024-03-26 NOTE — Addendum Note (Signed)
 Addended by: Cherylyn Cos on: 03/26/2024 09:58 AM   Modules accepted: Orders

## 2024-03-27 ENCOUNTER — Ambulatory Visit: Payer: Self-pay | Admitting: Cardiology

## 2024-03-27 LAB — LIPID PANEL

## 2024-03-30 ENCOUNTER — Other Ambulatory Visit: Payer: Self-pay | Admitting: Cardiology

## 2024-03-30 LAB — CBC
Hematocrit: 45 % (ref 37.5–51.0)
Hemoglobin: 15.1 g/dL (ref 13.0–17.7)
MCH: 31.5 pg (ref 26.6–33.0)
MCHC: 33.6 g/dL (ref 31.5–35.7)
MCV: 94 fL (ref 79–97)
Platelets: 189 10*3/uL (ref 150–450)
RBC: 4.79 x10E6/uL (ref 4.14–5.80)
RDW: 12.9 % (ref 11.6–15.4)
WBC: 6.1 10*3/uL (ref 3.4–10.8)

## 2024-03-30 LAB — COMPREHENSIVE METABOLIC PANEL WITH GFR
ALT: 20 IU/L (ref 0–44)
AST: 29 IU/L (ref 0–40)
Albumin: 4.6 g/dL (ref 3.9–4.9)
Alkaline Phosphatase: 86 IU/L (ref 44–121)
BUN/Creatinine Ratio: 18 (ref 10–24)
BUN: 14 mg/dL (ref 8–27)
Bilirubin Total: 0.3 mg/dL (ref 0.0–1.2)
CO2: 21 mmol/L (ref 20–29)
Calcium: 9.7 mg/dL (ref 8.6–10.2)
Chloride: 101 mmol/L (ref 96–106)
Creatinine, Ser: 0.77 mg/dL (ref 0.76–1.27)
Globulin, Total: 3 g/dL (ref 1.5–4.5)
Glucose: 125 mg/dL — ABNORMAL HIGH (ref 70–99)
Potassium: 4.7 mmol/L (ref 3.5–5.2)
Sodium: 139 mmol/L (ref 134–144)
Total Protein: 7.6 g/dL (ref 6.0–8.5)
eGFR: 99 mL/min/{1.73_m2} (ref >59)

## 2024-03-30 LAB — LIPID PANEL
Chol/HDL Ratio: 3.9 ratio (ref 0.0–5.0)
Cholesterol, Total: 149 mg/dL (ref 100–199)
HDL: 38 mg/dL — ABNORMAL LOW (ref >39)
LDL Chol Calc (NIH): 96 mg/dL (ref 0–99)
Triglycerides: 78 mg/dL (ref 0–149)
VLDL Cholesterol Cal: 15 mg/dL (ref 5–40)

## 2024-03-31 NOTE — Telephone Encounter (Signed)
 Call to patient to advise all labs are normal except LDL. Dr. Micael Adas states LDL goal is 74, advises patient to continue atorvastatin , add zetia 10 mg daily and retest cholesterol levels in 6 weeks. Despite extensive education, patient refuses at this time. He states he just got on a diet that works for him and he is not going to change his diet or meds at this time. He states he will consider it again in six months. Offered to have him test his labs again in six months, patient also declines this option.

## 2024-03-31 NOTE — Telephone Encounter (Signed)
-----   Message from Gaylyn Keas sent at 03/30/2024  5:45 PM EDT ----- LDL not at goal of < 70 - continue Atorvastatin  and add Zetia 10mg  daily and repeat FLP and ALT in 6 weeks

## 2024-05-11 ENCOUNTER — Other Ambulatory Visit: Payer: Self-pay | Admitting: Internal Medicine

## 2024-05-11 DIAGNOSIS — I4819 Other persistent atrial fibrillation: Secondary | ICD-10-CM

## 2024-05-12 NOTE — Telephone Encounter (Signed)
 Prescription refill request for Xarelto  received.  Indication:  AF Last office visit: 03/26/24  ONEIDA Bihari MD Weight: 116.6kg Age: 63 Scr: 0.77 on 03/26/24  Epic CrCl: 157.74  Based on above findings Xarelto  20mg  daily is the appropriate dose.  Refill approved.

## 2024-05-31 ENCOUNTER — Other Ambulatory Visit: Payer: Self-pay | Admitting: Cardiology
